# Patient Record
Sex: Male | Born: 1940
Health system: Southern US, Community
[De-identification: ages and names within clinical notes are randomized; demographics above are authoritative.]

## PROBLEM LIST (undated history)

## (undated) DIAGNOSIS — E785 Hyperlipidemia, unspecified: Secondary | ICD-10-CM

## (undated) DIAGNOSIS — N400 Enlarged prostate without lower urinary tract symptoms: Secondary | ICD-10-CM

## (undated) DIAGNOSIS — I82409 Acute embolism and thrombosis of unspecified deep veins of unspecified lower extremity: Secondary | ICD-10-CM

## (undated) DIAGNOSIS — M1990 Unspecified osteoarthritis, unspecified site: Secondary | ICD-10-CM

## (undated) HISTORY — PX: EYE SURGERY: SHX253

## (undated) HISTORY — PX: CATARACT EXTRACTION: SUR2

## (undated) HISTORY — PX: SEPTOPLASTY: SUR1290

## (undated) HISTORY — PX: TONSILLECTOMY: SUR1361

## (undated) HISTORY — PX: COLONOSCOPY W/ POLYPECTOMY: SHX1380

## (undated) HISTORY — PX: KNEE ARTHROSCOPY: SHX127

## (undated) HISTORY — DX: Hyperlipidemia, unspecified: E78.5

## (undated) HISTORY — PX: APPENDECTOMY: SHX54

## (undated) HISTORY — PX: HERNIA REPAIR: SHX51

## (undated) HISTORY — DX: Unspecified osteoarthritis, unspecified site: M19.90

---

## 1997-08-30 ENCOUNTER — Encounter: Admission: RE | Admit: 1997-08-30 | Discharge: 1997-08-30 | Payer: Self-pay | Admitting: Family Medicine

## 1998-04-16 ENCOUNTER — Encounter: Admission: RE | Admit: 1998-04-16 | Discharge: 1998-04-16 | Payer: Self-pay | Admitting: Sports Medicine

## 1998-06-18 ENCOUNTER — Encounter: Admission: RE | Admit: 1998-06-18 | Discharge: 1998-06-18 | Payer: Self-pay | Admitting: Family Medicine

## 1999-08-28 ENCOUNTER — Encounter: Admission: RE | Admit: 1999-08-28 | Discharge: 1999-08-28 | Payer: Self-pay | Admitting: Family Medicine

## 1999-09-05 ENCOUNTER — Encounter: Admission: RE | Admit: 1999-09-05 | Discharge: 1999-09-05 | Payer: Self-pay | Admitting: Family Medicine

## 1999-10-06 ENCOUNTER — Ambulatory Visit (HOSPITAL_COMMUNITY): Admission: RE | Admit: 1999-10-06 | Discharge: 1999-10-06 | Payer: Self-pay | Admitting: Sports Medicine

## 2000-07-23 ENCOUNTER — Encounter: Admission: RE | Admit: 2000-07-23 | Discharge: 2000-07-23 | Payer: Self-pay | Admitting: Family Medicine

## 2002-03-15 ENCOUNTER — Encounter: Payer: Self-pay | Admitting: Ophthalmology

## 2002-03-17 ENCOUNTER — Ambulatory Visit (HOSPITAL_COMMUNITY): Admission: RE | Admit: 2002-03-17 | Discharge: 2002-03-17 | Payer: Self-pay | Admitting: Ophthalmology

## 2002-07-06 ENCOUNTER — Encounter: Admission: RE | Admit: 2002-07-06 | Discharge: 2002-07-06 | Payer: Self-pay | Admitting: Family Medicine

## 2002-07-07 ENCOUNTER — Encounter: Admission: RE | Admit: 2002-07-07 | Discharge: 2002-07-07 | Payer: Self-pay | Admitting: Family Medicine

## 2003-11-15 ENCOUNTER — Ambulatory Visit: Payer: Self-pay | Admitting: Sports Medicine

## 2003-12-18 ENCOUNTER — Ambulatory Visit (HOSPITAL_COMMUNITY): Admission: RE | Admit: 2003-12-18 | Discharge: 2003-12-18 | Payer: Self-pay | Admitting: Vascular Surgery

## 2003-12-18 ENCOUNTER — Ambulatory Visit: Payer: Self-pay | Admitting: Sports Medicine

## 2004-01-11 ENCOUNTER — Ambulatory Visit (HOSPITAL_COMMUNITY): Admission: RE | Admit: 2004-01-11 | Discharge: 2004-01-11 | Payer: Self-pay | Admitting: Gastroenterology

## 2004-01-11 ENCOUNTER — Encounter (INDEPENDENT_AMBULATORY_CARE_PROVIDER_SITE_OTHER): Payer: Self-pay | Admitting: *Deleted

## 2004-07-31 ENCOUNTER — Ambulatory Visit: Payer: Self-pay | Admitting: Sports Medicine

## 2004-09-08 ENCOUNTER — Ambulatory Visit: Payer: Self-pay | Admitting: Sports Medicine

## 2004-09-09 ENCOUNTER — Ambulatory Visit: Payer: Self-pay | Admitting: Sports Medicine

## 2004-09-09 ENCOUNTER — Ambulatory Visit (HOSPITAL_COMMUNITY): Admission: RE | Admit: 2004-09-09 | Discharge: 2004-09-09 | Payer: Self-pay | Admitting: Sports Medicine

## 2004-09-12 ENCOUNTER — Ambulatory Visit: Payer: Self-pay | Admitting: Family Medicine

## 2004-09-15 ENCOUNTER — Ambulatory Visit: Payer: Self-pay | Admitting: Family Medicine

## 2004-09-19 ENCOUNTER — Ambulatory Visit: Payer: Self-pay | Admitting: Family Medicine

## 2004-09-24 ENCOUNTER — Ambulatory Visit: Payer: Self-pay | Admitting: Family Medicine

## 2004-09-26 ENCOUNTER — Ambulatory Visit (HOSPITAL_COMMUNITY): Admission: RE | Admit: 2004-09-26 | Discharge: 2004-09-26 | Payer: Self-pay | Admitting: Family Medicine

## 2004-09-29 ENCOUNTER — Ambulatory Visit: Payer: Self-pay | Admitting: Family Medicine

## 2004-10-02 ENCOUNTER — Ambulatory Visit: Payer: Self-pay | Admitting: Sports Medicine

## 2004-10-10 ENCOUNTER — Ambulatory Visit: Payer: Self-pay | Admitting: Sports Medicine

## 2004-10-21 ENCOUNTER — Ambulatory Visit: Payer: Self-pay | Admitting: Sports Medicine

## 2004-11-07 ENCOUNTER — Ambulatory Visit: Payer: Self-pay | Admitting: Sports Medicine

## 2004-12-05 ENCOUNTER — Ambulatory Visit: Payer: Self-pay | Admitting: Sports Medicine

## 2006-04-08 DIAGNOSIS — G2589 Other specified extrapyramidal and movement disorders: Secondary | ICD-10-CM | POA: Insufficient documentation

## 2006-04-08 DIAGNOSIS — E7800 Pure hypercholesterolemia, unspecified: Secondary | ICD-10-CM

## 2006-04-08 DIAGNOSIS — M545 Low back pain: Secondary | ICD-10-CM

## 2006-04-08 DIAGNOSIS — I872 Venous insufficiency (chronic) (peripheral): Secondary | ICD-10-CM | POA: Insufficient documentation

## 2006-04-08 DIAGNOSIS — Z86718 Personal history of other venous thrombosis and embolism: Secondary | ICD-10-CM

## 2006-06-10 ENCOUNTER — Ambulatory Visit: Payer: Self-pay | Admitting: Sports Medicine

## 2006-06-10 DIAGNOSIS — M1990 Unspecified osteoarthritis, unspecified site: Secondary | ICD-10-CM | POA: Insufficient documentation

## 2006-06-10 LAB — CONVERTED CEMR LAB
HCT: 51.3 %
Hemoglobin: 18.2 g/dL
MCV: 95.4 fL
Platelets: 212 10*3/uL
RBC: 5.37 M/uL

## 2006-06-14 LAB — CONVERTED CEMR LAB
ALT: 21 units/L (ref 0–53)
AST: 22 units/L (ref 0–37)
Alkaline Phosphatase: 58 units/L (ref 39–117)
Cholesterol: 235 mg/dL — ABNORMAL HIGH (ref 0–200)
Creatinine, Ser: 1.07 mg/dL (ref 0.40–1.50)
LDL Cholesterol: 154 mg/dL — ABNORMAL HIGH (ref 0–99)
Sodium: 141 meq/L (ref 135–145)
Total Bilirubin: 1.1 mg/dL (ref 0.3–1.2)
Total CHOL/HDL Ratio: 4.3
Total Protein: 7 g/dL (ref 6.0–8.3)
VLDL: 26 mg/dL (ref 0–40)

## 2006-10-26 ENCOUNTER — Encounter: Payer: Self-pay | Admitting: Sports Medicine

## 2006-11-01 ENCOUNTER — Encounter: Payer: Self-pay | Admitting: Sports Medicine

## 2007-04-04 ENCOUNTER — Encounter: Payer: Self-pay | Admitting: Sports Medicine

## 2007-04-27 ENCOUNTER — Encounter: Payer: Self-pay | Admitting: Sports Medicine

## 2007-05-03 ENCOUNTER — Encounter: Payer: Self-pay | Admitting: *Deleted

## 2007-05-17 ENCOUNTER — Encounter: Payer: Self-pay | Admitting: *Deleted

## 2007-07-01 ENCOUNTER — Ambulatory Visit (HOSPITAL_COMMUNITY): Admission: RE | Admit: 2007-07-01 | Discharge: 2007-07-01 | Payer: Self-pay | Admitting: Sports Medicine

## 2007-07-01 ENCOUNTER — Ambulatory Visit: Payer: Self-pay | Admitting: Sports Medicine

## 2007-08-24 ENCOUNTER — Encounter: Payer: Self-pay | Admitting: *Deleted

## 2008-03-30 ENCOUNTER — Encounter: Payer: Self-pay | Admitting: Family Medicine

## 2008-03-30 LAB — CONVERTED CEMR LAB: PSA: 4.22 ng/mL

## 2008-04-06 ENCOUNTER — Ambulatory Visit: Payer: Self-pay | Admitting: Family Medicine

## 2008-04-06 DIAGNOSIS — D126 Benign neoplasm of colon, unspecified: Secondary | ICD-10-CM

## 2008-04-10 ENCOUNTER — Ambulatory Visit: Payer: Self-pay | Admitting: Family Medicine

## 2008-04-10 ENCOUNTER — Encounter: Payer: Self-pay | Admitting: Family Medicine

## 2008-04-10 LAB — CONVERTED CEMR LAB
AST: 22 units/L (ref 0–37)
Alkaline Phosphatase: 63 units/L (ref 39–117)
Glucose, Bld: 95 mg/dL (ref 70–99)
HDL: 55 mg/dL (ref >39)
LDL Cholesterol: 148 mg/dL — ABNORMAL HIGH (ref 0–99)
Sodium: 143 meq/L (ref 135–145)
Total Bilirubin: 0.8 mg/dL (ref 0.3–1.2)
Total Protein: 6.7 g/dL (ref 6.0–8.3)
Triglycerides: 94 mg/dL (ref <150)
VLDL: 19 mg/dL (ref 0–40)
Vit D, 25-Hydroxy: 23 ng/mL — ABNORMAL LOW (ref 30–89)

## 2008-04-24 ENCOUNTER — Encounter: Payer: Self-pay | Admitting: Family Medicine

## 2008-04-25 ENCOUNTER — Encounter: Payer: Self-pay | Admitting: Family Medicine

## 2008-07-30 ENCOUNTER — Ambulatory Visit: Payer: Self-pay | Admitting: Family Medicine

## 2008-07-30 LAB — CONVERTED CEMR LAB
AST: 27 units/L (ref 0–37)
Alkaline Phosphatase: 55 units/L (ref 39–117)
BUN: 21 mg/dL (ref 6–23)
Creatinine, Ser: 1.26 mg/dL (ref 0.40–1.50)
Potassium: 4.2 meq/L (ref 3.5–5.3)

## 2008-07-31 ENCOUNTER — Telehealth: Payer: Self-pay | Admitting: *Deleted

## 2009-02-08 ENCOUNTER — Emergency Department (HOSPITAL_COMMUNITY): Admission: EM | Admit: 2009-02-08 | Discharge: 2009-02-08 | Payer: Self-pay | Admitting: Family Medicine

## 2009-02-25 ENCOUNTER — Encounter: Payer: Self-pay | Admitting: Family Medicine

## 2009-05-15 ENCOUNTER — Encounter: Payer: Self-pay | Admitting: Family Medicine

## 2009-07-05 ENCOUNTER — Ambulatory Visit: Payer: Self-pay | Admitting: Family Medicine

## 2009-07-05 ENCOUNTER — Ambulatory Visit (HOSPITAL_COMMUNITY): Admission: RE | Admit: 2009-07-05 | Discharge: 2009-07-05 | Payer: Self-pay | Admitting: Family Medicine

## 2009-07-05 DIAGNOSIS — IMO0001 Reserved for inherently not codable concepts without codable children: Secondary | ICD-10-CM

## 2009-07-05 DIAGNOSIS — G5600 Carpal tunnel syndrome, unspecified upper limb: Secondary | ICD-10-CM

## 2009-07-05 DIAGNOSIS — R5383 Other fatigue: Secondary | ICD-10-CM

## 2009-07-05 DIAGNOSIS — R5381 Other malaise: Secondary | ICD-10-CM | POA: Insufficient documentation

## 2009-07-05 DIAGNOSIS — R001 Bradycardia, unspecified: Secondary | ICD-10-CM

## 2009-07-09 LAB — CONVERTED CEMR LAB
BUN: 20 mg/dL (ref 6–23)
CO2: 25 meq/L (ref 19–32)
Cholesterol: 153 mg/dL (ref 0–200)
Creatinine, Ser: 1.04 mg/dL (ref 0.40–1.50)
Glucose, Bld: 86 mg/dL (ref 70–99)
Hemoglobin: 16.4 g/dL (ref 13.0–17.0)
MCHC: 33.7 g/dL (ref 30.0–36.0)
MCV: 94.9 fL (ref 78.0–100.0)
RBC: 5.12 M/uL (ref 4.22–5.81)
Total Bilirubin: 1.4 mg/dL — ABNORMAL HIGH (ref 0.3–1.2)
Total CHOL/HDL Ratio: 2.6
Triglycerides: 81 mg/dL (ref <150)
VLDL: 16 mg/dL (ref 0–40)

## 2009-07-10 ENCOUNTER — Encounter: Payer: Self-pay | Admitting: Family Medicine

## 2009-07-15 ENCOUNTER — Encounter: Payer: Self-pay | Admitting: Family Medicine

## 2009-07-31 ENCOUNTER — Ambulatory Visit: Payer: Self-pay | Admitting: Family Medicine

## 2009-08-01 ENCOUNTER — Ambulatory Visit: Payer: Self-pay | Admitting: Family Medicine

## 2009-08-16 ENCOUNTER — Ambulatory Visit: Payer: Self-pay | Admitting: Family Medicine

## 2009-08-16 ENCOUNTER — Encounter: Payer: Self-pay | Admitting: Family Medicine

## 2009-09-27 ENCOUNTER — Encounter: Payer: Self-pay | Admitting: Family Medicine

## 2009-09-27 ENCOUNTER — Ambulatory Visit: Payer: Self-pay | Admitting: Sports Medicine

## 2009-09-27 LAB — CONVERTED CEMR LAB: Total CK: 139 units/L (ref 7–232)

## 2009-09-30 ENCOUNTER — Telehealth: Payer: Self-pay | Admitting: Family Medicine

## 2009-12-12 ENCOUNTER — Ambulatory Visit: Payer: Self-pay | Admitting: Family Medicine

## 2009-12-12 DIAGNOSIS — J069 Acute upper respiratory infection, unspecified: Secondary | ICD-10-CM | POA: Insufficient documentation

## 2009-12-16 ENCOUNTER — Telehealth: Payer: Self-pay | Admitting: Family Medicine

## 2009-12-17 ENCOUNTER — Telehealth: Payer: Self-pay | Admitting: Family Medicine

## 2009-12-19 ENCOUNTER — Ambulatory Visit: Payer: Self-pay | Admitting: Family Medicine

## 2009-12-19 DIAGNOSIS — J029 Acute pharyngitis, unspecified: Secondary | ICD-10-CM | POA: Insufficient documentation

## 2010-03-11 NOTE — Progress Notes (Signed)
Summary: phn msg  Phone Note Call from Patient Call back at [phone]   Caller: Patient Summary of Call: pt is returning call Initial call taken by: De Nurse,  September 30, 2009 11:31 AM  Follow-up for Phone Call        Called.  CK normal off simvastatin.  Will start mid dose pravastatin and check labs in 3 months.  Patient agrees with plan. Follow-up by: Doralee Albino MD,  September 30, 2009 11:53 AM    New/Updated Medications: PRAVASTATIN SODIUM 20 MG TABS (PRAVASTATIN SODIUM) one by mouth qhs Prescriptions: PRAVASTATIN SODIUM 20 MG TABS (PRAVASTATIN SODIUM) one by mouth qhs  #90 x 3   Entered and Authorized by:   Doralee Albino MD   Signed by:   Doralee Albino MD on 09/30/2009   Method used:   Electronically to        [facility]* (retail)       [address]       [place], [place]  [id number]       Ph: [phone]       Fax: [phone]   RxID:   [id number]

## 2010-03-11 NOTE — Assessment & Plan Note (Signed)
Summary: place holter monitor/eo  Nurse Visit In office for placement of Holter Monitor.  Theresia Lo RN  July 31, 2009 9:18 AM   Allergies: 1)  ! Coumadin  Orders Added: 1)  Holter Monitor- [facility] [[id number]] 2)  Est Level 1- [facility] [[id number]]

## 2010-03-11 NOTE — Assessment & Plan Note (Signed)
Summary: cpe,tcb   Vital Signs:  Patient profile:   70 year old male Height:      75 inches Weight:      203 pounds BMI:     25.46 Temp:     97.6 degrees F Pulse rate:   46 / minute BP sitting:   136 / 74  (left arm) Cuff size:   regular  Vitals Entered By: Dennison Nancy RN CC: Adult physical Is Patient Diabetic? No Pain Assessment Patient in pain? yes     Location: rightelbow Intensity: 5 Type: aching   CC:  Adult physical.  History of Present Illness: Left hand numbness. muscle and joint pains recently. bradycardic.  Not on any meds that would slow heart rate.   Sees uro Patsi Sears) to follow BPH and PSA  Habits & Providers  Alcohol-Tobacco-Diet     Alcohol drinks/day: 3     Tobacco Status: quit > 6 months     Tobacco Counseling: to remain off tobacco products     Diet Comments: healthy  Exercise-Depression-Behavior     Does Patient Exercise: yes     Have you felt down or hopeless? no     Have you felt little pleasure in things? no     STD Risk: never     Drug Use: never     Seat Belt Use: always     Sun Exposure: infrequent  Current Medications (verified): 1)  Clonazepam 0.5 Mg Tabs (Clonazepam) .... Take 1 Tablet By Mouth Every Night 2)  Androgel Pump 1 % Gel (Testosterone) .... Per Uro 3)  Adult Aspirin Ec Low Strength 81 Mg Tbec (Aspirin) .... Otc Daily 4)  Simvastatin 20 Mg Tabs (Simvastatin) .Marland Kitchen.. 1 By Mouth At Bedtime  Allergies (verified): 1)  ! Coumadin  Past History:  Past medical, surgical, family and social histories (including risk factors) reviewed, and no changes noted (except as noted below).  Past Medical History: Reviewed history from 04/06/2008 and no changes required. atypical chest pain, lt shoulder - AC joint arthritis?  retinal detachment , right piriformis syndrome  tear of lt medical gastroc vs DVT - 07/2004  uses androgel  Past Surgical History: Reviewed history from 04/08/2006 and no changes required. Appendectomy -  08/28/1999, cataract surgery OD 2004 - 07/06/2002, ETT 12 mins 98/ 11 mins in 2001 - 08/28/1999, Hernia Repair - 08/28/1999  Family History: Reviewed history from 06/10/2006 and no changes required. 2 siblings - sister with breast ca  brother with melanoma  remarkable longevity father died at age 62  mother died with alzheimer`s at 51  Social History: Reviewed history from 06/10/2006 and no changes required. lives with wife Lupita Leash second wife/ she had MI may 2004;   divorced and has 4 kids by first wife; retired from [employer]  ex smoker 1962 to 25;  social etoh only;  exercises regularly - walking and tennisSmoking Status:  quit > 6 months Does Patient Exercise:  yes STD Risk:  never Drug Use:  never Seat Belt Use:  always Sun Exposure-Excessive:  infrequent  Physical Exam  General:  Well-developed,well-nourished,in no acute distress; alert,appropriate and cooperative throughout examination Head:  Normocephalic and atraumatic without obvious abnormalities. No apparent alopecia or balding. Mouth:  Oral mucosa and oropharynx without lesions or exudates.  Teeth in good repair. Neck:  No deformities, masses, or tenderness noted. Lungs:  Normal respiratory effort, chest expands symmetrically. Lungs are clear to auscultation, no crackles or wheezes. Heart:  Normal rate and regular rhythm. S1 and S2  normal without gallop, murmur, click, rub or other extra sounds. Abdomen:  Bowel sounds positive,abdomen soft and non-tender without masses, organomegaly or hernias noted. Msk:  No deformity or scoliosis noted of thoracic or lumbar spine.   Extremities:  + compression test Left wrist Neurologic:  No cranial nerve deficits noted. Station and gait are normal. Plantar reflexes are down-going bilaterally. DTRs are symmetrical throughout. Sensory, motor and coordinative functions appear intact.   Impression & Recommendations:  Problem # 1:  Preventive Health Care (ICD-V70.0)  Problem  # 2:  BRADYCARDIA (ICD-427.89) Consider holter if bloodwork shows no clear reason for fatigue His updated medication list for this problem includes:    Adult Aspirin Ec Low Strength 81 Mg Tbec (Aspirin) ..... Otc daily  Orders: 12 Lead EKG (12 Lead EKG)  Problem # 3:  MYALGIA (ICD-729.1) Check to see if statin induced myositis His updated medication list for this problem includes:    Adult Aspirin Ec Low Strength 81 Mg Tbec (Aspirin) ..... Otc daily  Orders: CK (Creatine Kinase)-[facility] [phone])  Problem # 4:  FATIGUE (ICD-780.79) Labs - if normal consider holter Orders: CBC-[facility] (09811) TSH-[facility] ([phone]) Sed Rate (ESR)-[facility] (13086)  Problem # 5:  CARPAL TUNNEL SYNDROME (ICD-354.0) mild.  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  Complete Medication List: 1)  Clonazepam 0.5 Mg Tabs (Clonazepam) .... Take 1 tablet by mouth every night 2)  Androgel Pump 1 % Gel (Testosterone) .... Per uro 3)  Adult Aspirin Ec Low Strength 81 Mg Tbec (Aspirin) .... Otc daily 4)  Simvastatin 10 Mg Tabs (Simvastatin) .Marland Kitchen.. 1 by mouth at bedtime 5)  Glucosamine-chondroitin Caps (Glucosamine-chondroit-vit c-mn) .... Otc 6)  Eql Fish Oil 1000 Mg Caps (Omega-3 fatty acids) .... One by mouth three times a day  Other Orders: Comp Met-[facility] [phone]) Lipid-[facility] [phone]) [facility] - Est  [phone])

## 2010-03-11 NOTE — Assessment & Plan Note (Signed)
Summary: still congestion/hensel/bmc   Vital Signs:  Patient profile:   70 year old male Height:      75 inches Weight:      202.31 pounds BMI:     25.38 BSA:     2.21 Temp:     98.8 degrees F Pulse rate:   47 / minute BP sitting:   126 / 73  Vitals Entered By: Jone Baseman CMA (December 19, 2009 10:35 AM) CC: still congested Is Patient Diabetic? No Pain Assessment Patient in pain? yes     Location: left side of neck tenderness Intensity: 5   Primary Provider:  Doralee Albino MD  CC:  still congested.  History of Present Illness: 1. URI viral in nature. no fever, no chest pain, no sob. c/o prolonged cough and yellow sputum. Now has nack tenderness to palpation in bilateral glands. treat conservatively. will check strep swab 2nd to contact with two strep + children. Will give a perscription for a zpack in case he is still not better in 4 days.  Habits & Providers  Alcohol-Tobacco-Diet     Alcohol drinks/day: 3     Tobacco Status: quit > 6 months     Tobacco Counseling: to remain off tobacco products     Diet Comments: healthy  Allergies: 1)  ! Coumadin 2)  Simvastatin (Simvastatin)  Review of Systems       see HPI  Physical Exam  General:  Well-developed,well-nourished,in no acute distress; alert,appropriate and cooperative throughout examination Nose:  External nasal examination shows no deformity or inflammation. Nasal mucosa are pink and moist without lesions or exudates. Mouth:  Oral mucosa and oropharynx without lesions or exudates.  Teeth in good repair. Neck:  left and right side of neck tender to palpation. swollen glands Lungs:  Normal respiratory effort, chest expands symmetrically. Lungs are clear to auscultation, no crackles or wheezes. Heart:  Normal rate and regular rhythm. S1 and S2 normal without gallop, murmur, click, rub or other extra sounds.   Impression & Recommendations:  Problem # 1:  URI (ICD-465.9) zpack if not better in 4  days.  His updated medication list for this problem includes:    Adult Aspirin Ec Low Strength 81 Mg Tbec (Aspirin) ..... Otc daily    Tussionex Pennkinetic Er 10-8 Mg/46ml Lqcr (Hydrocod polst-chlorphen polst) .Marland KitchenMarland KitchenMarland KitchenMarland Kitchen 5 ml every 12 hours for cough.  dispense 100 ml  Orders: [facility]- Est Level  3 (01093)  Complete Medication List: 1)  Clonazepam 0.5 Mg Tabs (Clonazepam) .... Take 1 tablet by mouth every night 2)  Androgel Pump 1 % Gel (Testosterone) .... Per uro 3)  Adult Aspirin Ec Low Strength 81 Mg Tbec (Aspirin) .... Otc daily 4)  Glucosamine-chondroitin Caps (Glucosamine-chondroit-vit c-mn) .... Otc 5)  Eql Fish Oil 1000 Mg Caps (Omega-3 fatty acids) .... One by mouth three times a day 6)  Pravastatin Sodium 20 Mg Tabs (Pravastatin sodium) .... One by mouth qhs 7)  Tussionex Pennkinetic Er 10-8 Mg/24ml Lqcr (Hydrocod polst-chlorphen polst) .... 5 ml every 12 hours for cough.  dispense 100 ml 8)  Azithromycin 250 Mg Tabs (Azithromycin) .... Take one tab a day by mouth for 5 days  Patient Instructions: 1)  Get plenty of rest, drink lots of clear liquids, and use Tylenol or Ibuprofen for fever and comfort. Return in 7-10 days if you're not better:sooner if you're feeling worse. 2)  I sent a perscription to [facility] pharmacy at [facility]. If you are still having symptoms in 4 days,  there is a antibiotic waiting for you.  3)  Nice to meet you, 4)  get better soon. Prescriptions: AZITHROMYCIN 250 MG TABS (AZITHROMYCIN) take one tab a day by mouth for 5 days  #5 x 0   Entered and Authorized by:   Edd Arbour   Signed by:   Edd Arbour on 12/19/2009   Method used:   Electronically to        [facility]* (retail)       [address]       [place], [place]  [id number]       Ph: [phone]       Fax: [phone]   RxID:   [id number]    Orders Added: 1)  [facility]- Est Level  3 [99213]  Appended Document: still congestion/hensel/bmc   Orders Added: 1)  Rapid  Strep-[facility] [87430] 2)  Influenza Vaccine MCR [00025]    Immunizations Administered:  Influenza Vaccine # 1:    Vaccine Type: Fluvax MCR    Site: left deltoid    Mfr: GlaxoSmithKline    Dose: 0.5 ml    Route: IM    Given by: Jimmy Footman, CMA    Exp. Date: 08/09/2010    Lot #: [id number]    VIS given: 09/03/09 version given December 19, 2009.  Flu Vaccine Consent Questions:    Do you have a history of severe allergic reactions to this vaccine? no    Any prior history of allergic reactions to egg and/or gelatin? no    Do you have a sensitivity to the preservative Thimersol? no    Do you have a past history of Guillan-Barre Syndrome? no    Do you currently have an acute febrile illness? no    Have you ever had a severe reaction to latex? no    Vaccine information given and explained to patient? yes  Laboratory Results  Date/Time Received: December 19, 2009 11:15 AM  Date/Time Reported: December 19, 2009 11:30 AM   Other Tests  Rapid Strep: negative Comments: ............test performed by............Marland KitchenBAJordan, MT(ASCP)11:30 AM entered by Terese Door, CMA

## 2010-03-11 NOTE — Letter (Signed)
Summary: Wellness Visit Letter  [facility]  [address]   [place], [place] [address]   Phone: [phone]  Fax: [phone]    07/10/2009  Ryan [name] [address] [place], [place]  [address]  Dear Mr. Toral,  We are happy to let you know that since you are covered under Medicare you are able to have a FREE visit at the [facility] to discuss your HEALTH. This is a new benefit for Medicare.  There will be no co-payment.  At this visit you will meet with Luretha Murphy an expert in wellness and the nurse practitioner at our clinic.  At this visit we will discuss ways to keep you healthy and feeling well.  This visit will not replace your regular doctor visit and we cannot refill medications.  We may schedule future blood work, give shots if needed, or schedule tests to look for hidden problems.   You will need to plan to be here at least one hour to talk about your medical history, your current status, review all of your medications, and discuss your future plans for your health.  This information will be entered into your record for your doctor to have and review.  If you are interested in staying healthy, this type of visit can help.  Please call the office at: [phone], to schedule a "Medicare Wellness Visit".  The day of the visit you should bring in all of your medications, including any vitamins, herbs, over the counter products you take.  Make a list of all the other doctors that you see, so we know who they are. If you have any other health documents please bring them.  We look forward to helping you stay healthy.     Sincerely,   Luretha Murphy NP  Appended Document: Wellness Visit Letter mailed.

## 2010-03-11 NOTE — Miscellaneous (Signed)
Summary: med refill  Clinical Lists Changes Refilled clonazepam via fax from pharm Medications: Changed medication from CLONAZEPAM 0.5 MG TABS (CLONAZEPAM) Take 1 tablet by mouth every night to CLONAZEPAM 0.5 MG TABS (CLONAZEPAM) Take 1 tablet by mouth every night - Signed Rx of CLONAZEPAM 0.5 MG TABS (CLONAZEPAM) Take 1 tablet by mouth every night;  #90 x 3;  Signed;  Entered by: Doralee Albino MD;  Authorized by: Doralee Albino MD;  Method used: Handwritten    Prescriptions: CLONAZEPAM 0.5 MG TABS (CLONAZEPAM) Take 1 tablet by mouth every night  #90 x 3   Entered and Authorized by:   Doralee Albino MD   Signed by:   Doralee Albino MD on 02/25/2009   Method used:   Handwritten   RxID:   [id number]

## 2010-03-11 NOTE — Progress Notes (Signed)
Summary: meds  Phone Note Call from Patient Call back at Home Phone [phone]   Caller: Patient Summary of Call: would like cough syrup w/ codiene [facility] Initial call taken by: De Nurse,  December 17, 2009 8:38 AM  Follow-up for Phone Call        i just called it in.  Please let patient know Follow-up by: Delbert Harness MD,  December 17, 2009 10:23 AM

## 2010-03-11 NOTE — Consult Note (Signed)
[facility]: [facility]   Imported By: De Nurse 05/20/2009 15:31:41  _____________________________________________________________________  External Attachment:    Type:   Image     Comment:   External Document

## 2010-03-11 NOTE — Progress Notes (Signed)
Summary: Rx  Phone Note Call from Patient Call back at [phone]   Reason for Call: Talk to Nurse Summary of Call: pt came in asking for an rx for cough syrup w/codeine, pt was told if his cough wasn't any better MD would prescribe. pt goes to [facility] Initial call taken by: Knox Royalty,  December 16, 2009 3:43 PM  Follow-up for Phone Call        Patient calling again regarding codeine cough syrup - advised that I could not fill prescriptions via the emergency line - advised to call back tomorrow for medication.  Follow-up by: Bobby Rumpf  MD,  December 16, 2009 7:58 PM    New/Updated Medications: Sandria Senter ER 10-8 MG/5ML LQCR (HYDROCOD POLST-CHLORPHEN POLST) 5 ml every 12 hours for cough.  Dispense 100 ml Prescriptions: TUSSIONEX PENNKINETIC ER 10-8 MG/5ML LQCR (HYDROCOD POLST-CHLORPHEN POLST) 5 ml every 12 hours for cough.  Dispense 100 ml  #1 x 0   Entered and Authorized by:   Delbert Harness MD   Signed by:   Delbert Harness MD on 12/17/2009   Method used:   Historical   RxID:   [id number]  Called in to [facility].

## 2010-03-11 NOTE — Assessment & Plan Note (Signed)
Summary: cough/kf   Vital Signs:  Patient profile:   70 year old male Weight:      204.5 pounds O2 Sat:      96 % on Room air Temp:     97.8 degrees F oral Pulse rate:   54 / minute Pulse rhythm:   regular BP sitting:   149 / 77  (left arm) Cuff size:   regular  Vitals Entered By: Loralee Pacas CMA (December 12, 2009 4:30 PM)  O2 Flow:  Room air CC: bronchitis   Primary Care Provider:  Doralee Albino MD  CC:  bronchitis.  History of Present Illness: 70 yo with no history of lung disease  here with 4 days of "chest congestion"  coughing with sputum and fatigue.  No dyspnea, wheezing, fever, headache, sore throat, abd pain, chest pain, n/v/d, mylagias.  Gets this once a year in the fall.  cough is most bothersome symptom, treating fairly with with OTC meds.  Current Medications (verified): 1)  Clonazepam 0.5 Mg Tabs (Clonazepam) .... Take 1 Tablet By Mouth Every Night 2)  Androgel Pump 1 % Gel (Testosterone) .... Per Uro 3)  Adult Aspirin Ec Low Strength 81 Mg Tbec (Aspirin) .... Otc Daily 4)  Glucosamine-Chondroitin  Caps (Glucosamine-Chondroit-Vit C-Mn) .... Otc 5)  Eql Fish Oil 1000 Mg Caps (Omega-3 Fatty Acids) .... One By Mouth Three Times A Day 6)  Pravastatin Sodium 20 Mg Tabs (Pravastatin Sodium) .... One By Mouth Qhs  Allergies: 1)  ! Coumadin 2)  Simvastatin (Simvastatin) PMH-FH-SH reviewed for relevance  Review of Systems      See HPI  Physical Exam  General:  Well-developed,well-nourished,in no acute distress; alert,appropriate and cooperative throughout examination Lungs:  Normal respiratory effort, chest expands symmetrically. Lungs are clear to auscultation, no crackles or wheezes. Heart:  Normal rate and regular rhythm. S1 and S2 normal without gallop, murmur, click, rub or other extra sounds. Abdomen:  Bowel sounds positive,abdomen soft and non-tender without masses, organomegaly or hernias noted.   Impression & Recommendations:  Problem # 1:  URI  (ICD-465.9)  Normal exam, no signs of bacterial infection.  Given short duration thus far and mild symptoms with no chronic disease, symptomatic treatment.  Discusse dhumidifier and honey for cough.  Offered cough syrup but patient prefers to continue with OTC meds.  Given red flags for return  His updated medication list for this problem includes:    Adult Aspirin Ec Low Strength 81 Mg Tbec (Aspirin) ..... Otc daily  Orders: [facility]- Est Level  3 ([address])  Complete Medication List: 1)  Clonazepam 0.5 Mg Tabs (Clonazepam) .... Take 1 tablet by mouth every night 2)  Androgel Pump 1 % Gel (Testosterone) .... Per uro 3)  Adult Aspirin Ec Low Strength 81 Mg Tbec (Aspirin) .... Otc daily 4)  Glucosamine-chondroitin Caps (Glucosamine-chondroit-vit c-mn) .... Otc 5)  Eql Fish Oil 1000 Mg Caps (Omega-3 fatty acids) .... One by mouth three times a day 6)  Pravastatin Sodium 20 Mg Tabs (Pravastatin sodium) .... One by mouth qhs   Orders Added: 1)  [facility]- Est Level  3 [[address]]

## 2010-03-11 NOTE — Assessment & Plan Note (Signed)
Summary: remove holter monitor/eo  Nurse Visit Holter monitor removed. Theresia Lo RN  August 01, 2009 12:22 PM   Allergies: 1)  ! Coumadin  Orders Added: 1)  No Charge Patient Arrived (NCPA0) [NCPA0]

## 2010-04-22 ENCOUNTER — Encounter: Payer: Self-pay | Admitting: Home Health Services

## 2010-05-12 LAB — POCT URINALYSIS DIP (DEVICE)
Bilirubin Urine: NEGATIVE
Glucose, UA: NEGATIVE mg/dL
Ketones, ur: 15 mg/dL — AB
Specific Gravity, Urine: 1.02 (ref 1.005–1.030)
Urobilinogen, UA: 1 mg/dL (ref 0.0–1.0)

## 2010-05-19 ENCOUNTER — Other Ambulatory Visit: Payer: Self-pay | Admitting: Family Medicine

## 2010-05-19 ENCOUNTER — Encounter: Payer: Self-pay | Admitting: Family Medicine

## 2010-05-19 DIAGNOSIS — H9190 Unspecified hearing loss, unspecified ear: Secondary | ICD-10-CM | POA: Insufficient documentation

## 2010-05-19 NOTE — Progress Notes (Signed)
Via e mail, requested hearing eval due to what seems to be age related difficulties and the possible need for a hearing aid.

## 2010-06-05 ENCOUNTER — Encounter: Payer: Self-pay | Admitting: Family Medicine

## 2010-06-05 DIAGNOSIS — N400 Enlarged prostate without lower urinary tract symptoms: Secondary | ICD-10-CM | POA: Insufficient documentation

## 2010-06-05 DIAGNOSIS — E291 Testicular hypofunction: Secondary | ICD-10-CM | POA: Insufficient documentation

## 2010-06-05 NOTE — Progress Notes (Signed)
  Subjective:    Patient ID: Ryan [name], male    DOB: 1940-10-03, 70 y.o.   MRN: [id number]  HPI  Seen by Marella Chimes on 06/03/10.  PSA=3.88 followed by uro    Review of Systems     Objective:   Physical Exam        Assessment & Plan:

## 2010-06-25 ENCOUNTER — Ambulatory Visit (INDEPENDENT_AMBULATORY_CARE_PROVIDER_SITE_OTHER): Payer: Medicare Other | Admitting: Family Medicine

## 2010-06-25 ENCOUNTER — Encounter: Payer: Self-pay | Admitting: Family Medicine

## 2010-06-25 VITALS — BP 131/82 | HR 61 | Temp 97.0°F | Ht 76.0 in | Wt 200.5 lb

## 2010-06-25 DIAGNOSIS — IMO0001 Reserved for inherently not codable concepts without codable children: Secondary | ICD-10-CM

## 2010-06-25 DIAGNOSIS — E785 Hyperlipidemia, unspecified: Secondary | ICD-10-CM

## 2010-06-25 DIAGNOSIS — M791 Myalgia, unspecified site: Secondary | ICD-10-CM

## 2010-06-25 MED ORDER — ATORVASTATIN CALCIUM 10 MG PO TABS: 10.0000 mg | ORAL_TABLET | ORAL | Status: DC

## 2010-06-27 ENCOUNTER — Encounter: Payer: Self-pay | Admitting: Family Medicine

## 2010-06-27 DIAGNOSIS — M791 Myalgia, unspecified site: Secondary | ICD-10-CM | POA: Insufficient documentation

## 2010-06-27 NOTE — Progress Notes (Signed)
  Subjective:    Patient ID: Ryan [name], male    DOB: 01/28/1941, 70 y.o.   MRN: [id number]  HPI Main issue is muscle pain.  Had with simvastatin and had increase in CPK.  Switched to pravastatin and did well for a while, but now seems to have sig myalgias.  Second issue is check for cerumen impaction.    Review of Systems     Objective:   Physical Exam Canals clear and TMs nl. Cardiac RRR Lungs clear Musculoskeletal normal       Assessment & Plan:

## 2010-06-27 NOTE — H&P (Signed)
   NAME:  Ryan [name], Ryan [name]                         ACCOUNT NO.:  [id number]   MEDICAL RECORD NO.:  [id number]                   PATIENT TYPE:  OIB   LOCATION:  2899                                 FACILITY:  [facility]   PHYSICIAN:  Guadelupe Sabin, M.D.             DATE OF BIRTH:  1940/08/06   DATE OF ADMISSION:  03/17/2002  DATE OF DISCHARGE:                                HISTORY & PHYSICAL   HISTORY OF PRESENT ILLNESS:  This was a planned outpatient surgical  admission of this 70 year old white male admitted for cataract implant  surgery of the right eye.   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  The patient was admitted on December 09, 1992, at which time  a pars plana vitrectomy with scleral bucking procedure was performed on the  right eye.  The patient did well postoperatively with retinal reattachment.  Gradually over the ensuing years the patient has developed cataract  formation in both eyes, greater in the right than left eye.  He has elected  to proceed with cataract implant surgery of the right eye at this time.   PAST MEDICAL HISTORY:  The patient is in excellent general health.   MEDICATIONS:  He currently takes no medications.   REVIEW OF SYSTEMS:  No cardiorespiratory complaints.   PHYSICAL EXAMINATION:  VITAL SIGNS:  As recorded on admission, blood  pressure 115/69, respirations 18, heart rate 56, and temperature 96.8  degrees.  GENERAL APPEARANCE:  The patient is a healthy, well-nourished, well-  developed, white male in no acute distress.  HEENT:  Eyes:  Visual acuity with best correction 20/300 right eye and 20/20  left eye.  Applanation tonometry normal, 16 mm right eye and 14 mm left eye.  On slit lamp examination, the eyes are white and clear with nuclear cataract  formation, greater in the right than left eye.  Detailed fundus examination  reveals a clear vitreous and old scleral buckling indentation.  The  retina  is attached with normal optic, nerves, blood vessels, and macular in the  right eye.  CHEST:  Lungs clear to auscultation and percussion.  HEART:  Normal sinus rhythm.  No cardiomegaly.  No murmurs.  ABDOMEN:  Negative.  EXTREMITIES:  Negative.   ADMISSION DIAGNOSES:  1. Senile cataract, right eye.  2. Status post scleral buckling for retinal detachment, right eye.   SURGICAL PLAN:  Cataract implant surgery, right eye.                                               Guadelupe Sabin, M.D.    HNJ/MEDQ  D:  03/17/2002  T:  03/17/2002  Job:  [id number]

## 2010-06-27 NOTE — H&P (Signed)
NAME:  Ryan [name], Ryan [name]                         ACCOUNT NO.:  [id number]   MEDICAL RECORD NO.:  [id number]                   PATIENT TYPE:  OIB   LOCATION:  2899                                 FACILITY:  [facility]   PHYSICIAN:  Guadelupe Sabin, M.D.             DATE OF BIRTH:  May 10, 1940   DATE OF ADMISSION:  03/17/2002  DATE OF DISCHARGE:                                HISTORY & PHYSICAL   PREOPERATIVE DIAGNOSES:  1. Senile nuclear cataract, right eye.  2. 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   POSTOPERATIVE DIAGNOSES:  1. Senile nuclear cataract, right eye.  2. 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   OPERATIONS:  Planned extracapsular cataract extraction and primary insertion  of posterior chamber intraocular lens implant.   SURGEON:  Guadelupe Sabin, M.D.   ASSISTANT:  Nurse.   ANESTHESIA:  Local 4% Xylocaine and 0.75 Marcaine with Wydase added  retrobulbar injection.  Topical tetracaine and intraoperative Xylocaine.  Anesthesia standby required in this 70 year old patient given sodium  pentothol intravenously during the period of retrobulbar injection.   DESCRIPTION OF PROCEDURE:  After the patient was prepped and draped, a lid  speculum was inserted in the right eye.  The eye was turned toward and a  superior rectus traction suture placed.  Schiotz tonometry was recorded at  10 scale units with a 5.5 g weight.  A peritomy was performed adjacent to  the limbus from the 11 to 1 o'clock position.  The corneoscleral junction  was cleaned and a corneoscleral groove made with a 45 degree Superblade.  The anterior chamber was then entered with the 2.5 mm diamond keratome at  the 12 o'clock position and a 15 degree blade at the 2:30 position.  Using a  bent 26 gauge needle on a Healon syringe, a circular capsulorrhexis was  begun and then completed with the Grabow forceps.  Hydrodissection and  hydrodelineation were  performed using 1% Xylocaine.  The 30 degree  phakoemulsification tip was then inserted with slow controlled  emulsification of the lens nucleus.  Total ultrasonic time 1 minute 12  seconds.  Average power level 21%.  Following removal of the nucleus, the  residual cortex was aspirated with the irrigation-aspiration tip.  The  posterior capsule appeared intact with a brilliant red fundus reflex.  It  was therefore elected to insert an [employer] [id number] silicone  posterior chamber intraocular lens implant, diopter strength +14.00.  This  was inserted with the McDonald forceps into the anterior chamber and then  centered into the capsular bag.  The lens appeared to be well centered.  The  intraocular Healon was then aspirated and replaced with balance salt  solution and Miochol ophthalmic solution.  The operative incisions appeared  to be self-sealing and no sutures were required.  A light patch and  protective shield  were applied to the operated  right eye.  The duration of the procedure and  anesthesia administration was 45 minutes.  The patient tolerated the  procedure well in general and left the operating room for the recovery room  in good condition.                                               Guadelupe Sabin, M.D.    HNJ/MEDQ  D:  03/17/2002  T:  03/17/2002  Job:  [id number]

## 2010-06-27 NOTE — Assessment & Plan Note (Signed)
Stop pravastatin for 4 weeks.  See if myalgias resolve.  If not, return for further WU If myalgias do resolve, start lipitor qod.

## 2010-07-15 ENCOUNTER — Other Ambulatory Visit: Payer: Self-pay | Admitting: Family Medicine

## 2010-07-15 ENCOUNTER — Ambulatory Visit (HOSPITAL_COMMUNITY)
Admission: RE | Admit: 2010-07-15 | Discharge: 2010-07-15 | Disposition: A | Discharge status: Home / Self Care | Payer: Medicare Other | Source: Ambulatory Visit | Attending: Family Medicine | Admitting: Family Medicine

## 2010-07-15 ENCOUNTER — Telehealth: Payer: Self-pay | Admitting: *Deleted

## 2010-07-15 DIAGNOSIS — M79609 Pain in unspecified limb: Secondary | ICD-10-CM | POA: Insufficient documentation

## 2010-07-15 DIAGNOSIS — M7989 Other specified soft tissue disorders: Secondary | ICD-10-CM | POA: Insufficient documentation

## 2010-07-15 DIAGNOSIS — I80299 Phlebitis and thrombophlebitis of other deep vessels of unspecified lower extremity: Secondary | ICD-10-CM

## 2010-07-15 NOTE — Telephone Encounter (Signed)
Spoke with patient and informed him of appointment set up for DVT @ 1pm.Nicolle Heward, Roselyn Meier

## 2010-07-15 NOTE — Progress Notes (Signed)
Received this e mail and responded:  Ryan [name],  You need to have that leg tested today for blood clots (venous dopplers.)  I will have my nurse call and arrange with you.  Please note new e mail address: [email or web address] Santiago Bumpers. Leveda Anna, MD Director, [facility] Professor,  [facility], Dept of [place] [phone] Fax- [phone] Digital pager [phone]   -----Original Message----- From: Tommie Sams [[email or web address]]  Sent: Tuesday, July 15, 2010 8:15 AM To: Leland Raver, Bill Subject: Left Leg  Bill, Sorry to bother you with this but I feel the need to do something (as does Lupita Leash) and I'm not sure what.  Since seeing you a few weeks ago, I have stopped the staten meds and am waiting with new prescription in hand to restart on/about July 1 as we discussed.  In the meantime, pain in my left leg has gotten progressively worse, exacerbated some by exercise, but continuous.  It seems centered in the back of the knee, which has a noticable puffy "swelling".  The concern is heightened by the fact that this is the leg I had the clot in a couple of years ago.  Ibuprofen seems to alleviate the pain some, suggesting perhaps inflamation?  My leg is not sensitive to touch as was the case with earlier phlebitis.  Most serious pain occurs when I stand up from a sitting position, and it takes a moment and several very hesitant steps before I can walk with any sense of "normal"  So, I am inclined to see either you or Roanna Epley, who treated me earlier for the clot.  What is your suggestion.  Ryan [name]  -- [name] [address] [place], [place]  [address] [phone] (home) [phone] (cell) [name]

## 2010-07-18 ENCOUNTER — Encounter: Payer: Self-pay | Admitting: Family Medicine

## 2010-07-18 ENCOUNTER — Ambulatory Visit (INDEPENDENT_AMBULATORY_CARE_PROVIDER_SITE_OTHER): Payer: Medicare Other | Admitting: Family Medicine

## 2010-07-18 DIAGNOSIS — R29898 Other symptoms and signs involving the musculoskeletal system: Secondary | ICD-10-CM

## 2010-07-18 NOTE — Assessment & Plan Note (Addendum)
Patient with popliteal fullness on the Left side.  No discreet mass nor calor.  Painful with flexion beyond 90 degrees.  Vascular doppler study of 06/05 done, no DVT nor Bakers cyst seen.  Will refer for Sports Medicine evaluation, for consideration of whether needs MRI imaging versus US done at [facility].  Ibuprofen seems to help this pain intermittently.

## 2010-07-18 NOTE — Progress Notes (Signed)
  Subjective:    Patient ID: Ryan [name], male    DOB: Dec 03, 1940, 70 y.o.   MRN: [id number]  HPI Patient here for same-day appointment, complaint of L popliteal pain that is worst when he gets up in the morning, gets mildly better during the day with activity.  Has hampered his tennis game of late.  Had prior "traumatic" DVT in the past after getting hit by tennis racket, was treated with anticoagulation for first-time DVT and finished course.  Had doppler US done on 07/15/10 which did not find evidence of DVT or Bakers cyst.  Patient reports some relief with ibuprofen, worsening with attempts at flexion of knee.  No recent trauma or injury.  No redness or swelling in the knee.  Some radiation to L groin at times.  Has been present for several months.  Different from the generalized leg pain that he had with simvastatin, not the same as his restless legs syndrome.    States that he has not started the lipitor yet.    Review of Systems     Objective:   Physical Exam Pleasant, well appearing, no apparent distress. Able to ambulate independently.  LEs: No knee effusion or erythema bilaterally.  In LEFT popliteal space there is a fullness and tenderness along the medial aspect of the popliteal fossa, not a discreet mass.  Does not move with flexion/extension.  Notable discomfort with passive or active flexion of L knee.  No cords, no edema in LEs bilaterally.  No joint laxity.  Palpable dp pulses bilaterally. Sensation in toes grossly intact bilaterally.        Assessment & Plan:

## 2010-07-18 NOTE — Patient Instructions (Signed)
Please make an appointment with Sports Medicine to evaluate the fullness in the left popliteal fossa.

## 2010-07-28 ENCOUNTER — Other Ambulatory Visit: Payer: Medicare Other

## 2010-07-28 ENCOUNTER — Other Ambulatory Visit: Payer: Self-pay | Admitting: Family Medicine

## 2010-07-28 ENCOUNTER — Encounter: Payer: Self-pay | Admitting: Family Medicine

## 2010-07-28 ENCOUNTER — Ambulatory Visit (INDEPENDENT_AMBULATORY_CARE_PROVIDER_SITE_OTHER): Payer: Medicare Other | Admitting: Family Medicine

## 2010-07-28 VITALS — BP 140/84

## 2010-07-28 DIAGNOSIS — R29898 Other symptoms and signs involving the musculoskeletal system: Secondary | ICD-10-CM

## 2010-07-28 DIAGNOSIS — M25562 Pain in left knee: Secondary | ICD-10-CM

## 2010-07-28 DIAGNOSIS — E785 Hyperlipidemia, unspecified: Secondary | ICD-10-CM

## 2010-07-28 LAB — COMPLETE METABOLIC PANEL WITH GFR
ALT: 14 U/L (ref 0–53)
CO2: 29 mEq/L (ref 19–32)
Creat: 1.05 mg/dL (ref 0.50–1.35)
GFR, Est African American: >60 mL/min (ref >60)
GFR, Est Non African American: >60 mL/min (ref >60)
Total Bilirubin: 0.7 mg/dL (ref 0.3–1.2)

## 2010-07-28 LAB — LIPID PANEL
Cholesterol: 208 mg/dL — ABNORMAL HIGH (ref 0–200)
HDL: 63 mg/dL (ref >39)
LDL Cholesterol: 135 mg/dL — ABNORMAL HIGH (ref 0–99)
Triglycerides: 52 mg/dL (ref <150)

## 2010-07-28 NOTE — Progress Notes (Signed)
cmp and flp done today Minola Guin 

## 2010-07-28 NOTE — Patient Instructions (Signed)
Your appt for the MRI is on Tues, June 26th at 10:15am at the [address] location of [facility]. Their number is [phone] if you have questions or need to reschedule the appt.

## 2010-07-28 NOTE — Progress Notes (Signed)
  Subjective:    Patient ID: Ryan [name], male    DOB: 10/18/1940, 70 y.o.   MRN: [id number]  HPI  Left knee pain. This is going on for several weeks. Came on slowly. No specific injury. Has been seen by his primary care physician and had vascular ultrasound to rule out DVT which was negative. Is having difficulty doing a lot of walking because of pain. Cannot fully extend his leg or fully bend it. This is gotten worse over the last 3 weeks. Pain is mostly in the posterior left knee, worse with stairs or extended periods of sitting.  PERTINENT  PMH / PSH:He has history of DVT many years ago. That was a traumatic DVT.  Review of Systems No fever, no unusual weight gain or loss. He has had some myalgias and arthralgias that his PCP thinks related to statin medication and they are switching those around. This symptom is getting better.    Objective:   Physical Exam    GENERAL: Well-developed nourished no acute distress Left knee: Lacks full extension by 15 and can only flex to 100. The left popliteal space is full. The calf is soft. His gait is somewhat antalgic. Left knee ligamentously intact and negative McMurray. SKIN: Small hyperpigmented area on the posterior lateral left knee. There is no warmth or edema of the popliteal space or the calf.  ;MSK [place]: Left knee and calf reveals normal gastrocnemius muscles bilaterally. At the point of origin of the gastroc heads, there is some nonspecific heterogeneous shadow. This does not appear a hypo-echoic. The popliteal artery and vein are identified. 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 I can't see this very well on the ultrasound. I think we need to get further imaging with MRI with contrast. We have set that up. He is going to get blood work today for creatinine level. I will call him after the MRI. In the interim I would not do anything  vigorous such as tennis or a lot of climbing. I think it's okay to do walking and stationary biking.

## 2010-07-29 ENCOUNTER — Encounter: Payer: Self-pay | Admitting: Family Medicine

## 2010-07-29 NOTE — Progress Notes (Signed)
  Subjective:    Patient ID: Ryan [name], male    DOB: Jul 24, 1940, 70 y.o.   MRN: [id number]  HPI High LDL is off meds.  He will start lipitor.    Review of Systems     Objective:   Physical Exam        Assessment & Plan:

## 2010-08-04 ENCOUNTER — Telehealth: Payer: Self-pay | Admitting: Family Medicine

## 2010-08-04 NOTE — Telephone Encounter (Signed)
Ordered at sports medicine

## 2010-08-04 NOTE — Telephone Encounter (Signed)
2nd call for precert.  Appt is for 9:00 Tuesday, June 26th, needs to hear something today.

## 2010-08-04 NOTE — Telephone Encounter (Signed)
Kristi at [facility] needs a precert for MRI for tomorrow.

## 2010-08-05 ENCOUNTER — Ambulatory Visit
Admission: RE | Admit: 2010-08-05 | Discharge: 2010-08-05 | Disposition: A | Discharge status: Home / Self Care | Payer: Medicare Other | Source: Ambulatory Visit | Attending: Family Medicine | Admitting: Family Medicine

## 2010-08-05 ENCOUNTER — Telehealth: Payer: Self-pay | Admitting: *Deleted

## 2010-08-05 ENCOUNTER — Other Ambulatory Visit: Payer: Medicare Other

## 2010-08-05 ENCOUNTER — Other Ambulatory Visit: Payer: Self-pay | Admitting: Family Medicine

## 2010-08-05 DIAGNOSIS — R2240 Localized swelling, mass and lump, unspecified lower limb: Secondary | ICD-10-CM

## 2010-08-05 DIAGNOSIS — M25562 Pain in left knee: Secondary | ICD-10-CM

## 2010-08-05 MED ORDER — GADOBENATE DIMEGLUMINE 529 MG/ML IV SOLN
18.0000 mL | Freq: Once | INTRAVENOUS | Status: AC | PRN
  Administered 2010-08-05: 18 mL via INTRAVENOUS

## 2010-08-05 NOTE — Telephone Encounter (Signed)
PA # for pt's MRI of Lt Knee w/ and wo contrast is A [id number].  Pt and [facility] notified of auth #.

## 2010-08-07 ENCOUNTER — Telehealth: Payer: Self-pay | Admitting: Family Medicine

## 2010-08-07 NOTE — Telephone Encounter (Signed)
Discussed his MRI--we decided he wants to go ahead and see surgeon--he has family orthopedist but cannot remember the name--he will call back w name and I wwill make referral Ryan [name]

## 2010-08-08 ENCOUNTER — Other Ambulatory Visit: Payer: Self-pay | Admitting: *Deleted

## 2010-08-08 DIAGNOSIS — M25562 Pain in left knee: Secondary | ICD-10-CM

## 2010-08-08 NOTE — Progress Notes (Signed)
Pt scheduled himself with Dr. Shelle Iron @ [facility] for 08/20/10.  Pt's records faxed to office today.

## 2010-08-18 ENCOUNTER — Other Ambulatory Visit: Payer: Self-pay | Admitting: Family Medicine

## 2010-08-18 MED ORDER — CLONAZEPAM 0.5 MG PO TABS: 0.5000 mg | ORAL_TABLET | Freq: Every day | ORAL | Status: DC

## 2010-08-18 MED ORDER — TRAMADOL HCL 50 MG PO TABS: 50.0000 mg | ORAL_TABLET | Freq: Four times a day (QID) | ORAL | Status: AC | PRN

## 2010-08-18 NOTE — Telephone Encounter (Signed)
In response to this e mail:  Annette Stable, I have phoned in a refill request to [facility], but they will need re-authorization to fill it.  Lupita Leash and I leave for a few days on Wednesday morning to attend our daughter's wedding and I don't have enough remaining to last me until we return.  Could you please call [facility] [phone]) or take such other action as is required?    Still chronic pain in left leg, centered in the knee, and varying in intensity day to day.  Ibuprophen helps.  Following MRI, was referred by Dr. Jennette Kettle?? at Sports Medicine clinic to specialist at [facility].  Dr. Thomasena Edis has seen several members of our family, but he is away so I will see another (name escapes me at the moment) on Tuesday.  I frankly would very much like Electa Sniff to see me - not sure of his status but may check on that Monday.  Thanks for your help with the prescription.  Ryan [name]  I refilled clonazepam, added tramadol for nighttime leg/knee pain, suggested he see Dr. Darrick Penna, and suggested he hold off on ortho appointment until he could see Dr. Thomasena Edis who he knows and trusts.  I have reviewed the MRI and don't see any need for urgent intervention.

## 2010-08-19 ENCOUNTER — Ambulatory Visit (INDEPENDENT_AMBULATORY_CARE_PROVIDER_SITE_OTHER): Payer: Medicare Other | Admitting: Family Medicine

## 2010-08-19 ENCOUNTER — Encounter: Payer: Self-pay | Admitting: Family Medicine

## 2010-08-19 VITALS — BP 132/83 | HR 70

## 2010-08-19 DIAGNOSIS — M25569 Pain in unspecified knee: Secondary | ICD-10-CM

## 2010-08-19 DIAGNOSIS — M25562 Pain in left knee: Secondary | ICD-10-CM | POA: Insufficient documentation

## 2010-08-19 MED ORDER — MELOXICAM 15 MG PO TABS: 15.0000 mg | ORAL_TABLET | Freq: Every day | ORAL | Status: AC

## 2010-08-19 NOTE — Progress Notes (Signed)
  Subjective:    Patient ID: Ryan [name], male    DOB: 09/25/1940, 70 y.o.   MRN: [id number]  HPI The patient is a previously healthy 70 year old male with left knee pain for approximately 2 months.  He was seen several times at the sports medicine center and had an ultrasound that was concerning for an vascular malformation. An MRI of his knee was performed which showed predominantly degenerative changes of the meniscus, a moderate effusion, a small tear of the medial head of the gastrocnemius, and some plica pathology. He returns today for followup.  His primary complaint is sharp and stabbing pain and decreased mobility because of his left knee. He also has significant pain with trying to straighten the knee. Otherwise there is no fevers chills night sweats or weight loss.  Review of Systems    see history of present illness Objective:   Physical Exam     General: Alert well-developed well-nourished Caucasian male who appears stated age. Musculoskeletal exam: The knee shows significant swelling on the left when compared with the right. There is significant tenderness to palpation over the lateral aspect of the knee. The patella is ballotable and the knee is somewhat warm. The patient's range of motion is from 10 of extension to 90 of flexion. He has significant pain and tightness when we attempt to exceed these limits. His MCL anterior cruciate ligament PCL and LCL are all intact.  Consent obtained and verified. Time-out conducted. Noted no overlying erythema, induration, or other signs of local infection. Sterile betadine prep. Furthur cleansed with alcohol. Topical analgesic spray: Ethyl chloride. 5 cc of 1% lidocaine was infiltrated under the skin over the lateral suprapatellar pocket. I was able to aspirate approximately 60 cc of straw-colored fluid from the left knee through an 18-gauge needle. The syringe was then switched and 1 cc of Kenalog 40 and 9 cc of lidocaine 1% was  injected into the knee. Completed without difficulty The patient had immediate improvement in pain. Advised to call if fevers/chills, erythema, induration, drainage, or persistent bleeding.  Assessment & Plan:

## 2010-08-19 NOTE — Assessment & Plan Note (Addendum)
The patient's left knee pain and fullness is most likely due to degenerative joint disease of the left knee. There is no sign of a vascular malformation based on the MRI. We aspirated approximately 60 cc of fluid from his knee and injected Kenalog 40 into the knee. As he also had significantly decreased range of motion we'll go ahead and get him in to see the physical therapist. I will also give him Mobic 15 by mouth daily for pain. Should he continue to have pain and swelling he would be a good candidate for Visco supplementation. I would like to see him back in approximately 1 month.  Ihor Austin. Benjamin Stain, M.D.  Examined history taken along with Sports Medicine Fellow, and agree with assessment and plan.  Hannah Beat, MD

## 2010-08-27 ENCOUNTER — Ambulatory Visit: Payer: Medicare Other | Attending: Family Medicine | Admitting: Physical Therapy

## 2010-08-27 DIAGNOSIS — IMO0001 Reserved for inherently not codable concepts without codable children: Secondary | ICD-10-CM | POA: Insufficient documentation

## 2010-08-27 DIAGNOSIS — R269 Unspecified abnormalities of gait and mobility: Secondary | ICD-10-CM | POA: Insufficient documentation

## 2010-08-27 DIAGNOSIS — M25569 Pain in unspecified knee: Secondary | ICD-10-CM | POA: Insufficient documentation

## 2010-08-27 DIAGNOSIS — M25669 Stiffness of unspecified knee, not elsewhere classified: Secondary | ICD-10-CM | POA: Insufficient documentation

## 2010-09-01 ENCOUNTER — Ambulatory Visit: Payer: Medicare Other | Admitting: Physical Therapy

## 2010-09-03 ENCOUNTER — Ambulatory Visit: Payer: Medicare Other | Admitting: Sports Medicine

## 2010-09-04 ENCOUNTER — Ambulatory Visit: Payer: Medicare Other | Admitting: Physical Therapy

## 2010-09-08 ENCOUNTER — Ambulatory Visit: Payer: Medicare Other | Admitting: Physical Therapy

## 2010-09-10 ENCOUNTER — Encounter: Payer: Medicare Other | Admitting: Physical Therapy

## 2010-09-16 ENCOUNTER — Encounter: Payer: Self-pay | Admitting: Sports Medicine

## 2010-09-16 ENCOUNTER — Ambulatory Visit (INDEPENDENT_AMBULATORY_CARE_PROVIDER_SITE_OTHER): Payer: Medicare Other | Admitting: Sports Medicine

## 2010-09-16 VITALS — BP 136/81 | HR 49

## 2010-09-16 DIAGNOSIS — S83289A Other tear of lateral meniscus, current injury, unspecified knee, initial encounter: Secondary | ICD-10-CM

## 2010-09-16 DIAGNOSIS — S83207A Unspecified tear of unspecified meniscus, current injury, left knee, initial encounter: Secondary | ICD-10-CM | POA: Insufficient documentation

## 2010-09-16 DIAGNOSIS — S83271A Complex tear of lateral meniscus, current injury, right knee, initial encounter: Secondary | ICD-10-CM

## 2010-09-16 DIAGNOSIS — M25562 Pain in left knee: Secondary | ICD-10-CM

## 2010-09-16 DIAGNOSIS — M25569 Pain in unspecified knee: Secondary | ICD-10-CM

## 2010-09-16 DIAGNOSIS — G2589 Other specified extrapyramidal and movement disorders: Secondary | ICD-10-CM

## 2010-09-16 MED ORDER — CLONAZEPAM 0.5 MG PO TABS: 0.5000 mg | ORAL_TABLET | Freq: Two times a day (BID) | ORAL | Status: DC

## 2010-09-16 NOTE — Patient Instructions (Signed)
Start taking 2 klonopin at bedtime Try biking to help with knee bend and quad strength Try knee sleeve during exercise Follow up in 6 weeks

## 2010-09-16 NOTE — Assessment & Plan Note (Signed)
Left knee pain is clearly improving. Of interest some of his most severe pain is when he gets a leg cramp forces as many and too much flexion at night With this in mind he will continued on medications as before. We gave him some simple knee exercises and will gradually have him restart some biking or stationary biking.  He has played tennis one time. He did have more pain after that. I suggested that he continuously for the next 2 weeks before trying any additional playing. I also suggested trying a compression sleeve for the left knee while playing.

## 2010-09-16 NOTE — Assessment & Plan Note (Signed)
Based on MRI his changes are primarily degenerative. We will need to have him use good support when playing tennis. He will be to maintain good quadriceps and hip strength and seems to have good strength on exam today.  Biking would be a good adjunctive training to keep the knee more stable.

## 2010-09-16 NOTE — Assessment & Plan Note (Signed)
With nightly leg cramps in his left leg I increased his Klonopin from 0.5 mg 1.0 mg at night. He was given a prescription for this and will review it further with his family physician on return to clinic

## 2010-09-16 NOTE — Progress Notes (Signed)
  Subjective:    Patient ID: Ryan [name], male    DOB: 1941-02-04, 70 y.o.   MRN: [id number]  HPI  Pt presents to clinic for f/u of left knee pain which he states is about 50% improved.  Does still have some lt knee pain that he uses meloxicam once daily, and tramadol twice daily to control, and this regimen is helpful.  States the aspiration and steroid injection he had at last visit was significantly helpful to relieve the immediate pain.  Has not been playing tennis since last visit.  Has done 3-4 PT sessions, but does not feel this has been helpful.  Has left leg cramping especially at night, took 2 of his klonopin last night, and he did not have the leg cramping.   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 He notes that he is averaging 1 significant leg cramps per night sometimes it starts in the hamstrings but most of time in the calf  Review of Systems     Objective:   Physical Exam  Almost full extension lt knee -he is -3 Mild suprapatellar pouch swelling Rt knee full flexion and neg mcmurray's Pain at 110 deg flexion on lt, small baker's cyst Negative mcmurray's on lt Ligaments stable on lt knee Good quad strength bilat Left leg slightly swollen compared to rt Good calf definition on heel raise bilat Neg straight leg raise  MRI reviewed There is diffuse signal in the medial and lateral meniscus of the left knee This is associated with thinning of the meniscal cartilage and of the articular cartilage more laterally than medially He does not show signs of a significant tear but more of degenerative change He does not show extensive  arthritis      Assessment & Plan:

## 2010-09-23 ENCOUNTER — Encounter: Payer: Self-pay | Admitting: Sports Medicine

## 2010-09-29 ENCOUNTER — Other Ambulatory Visit: Payer: Self-pay | Admitting: Family Medicine

## 2010-09-29 DIAGNOSIS — E785 Hyperlipidemia, unspecified: Secondary | ICD-10-CM

## 2010-09-29 MED ORDER — ATORVASTATIN CALCIUM 10 MG PO TABS: 10.0000 mg | ORAL_TABLET | Freq: Every day | ORAL | Status: DC

## 2010-09-29 NOTE — Telephone Encounter (Signed)
Some confusion re dosing.  He has been taking 10 mg daily without problem.  Will continue that dose.

## 2010-10-28 ENCOUNTER — Ambulatory Visit (INDEPENDENT_AMBULATORY_CARE_PROVIDER_SITE_OTHER): Payer: Medicare Other | Admitting: Sports Medicine

## 2010-10-28 DIAGNOSIS — M25569 Pain in unspecified knee: Secondary | ICD-10-CM

## 2010-10-28 DIAGNOSIS — S83289A Other tear of lateral meniscus, current injury, unspecified knee, initial encounter: Secondary | ICD-10-CM

## 2010-10-28 DIAGNOSIS — M25562 Pain in left knee: Secondary | ICD-10-CM

## 2010-10-28 DIAGNOSIS — S83271A Complex tear of lateral meniscus, current injury, right knee, initial encounter: Secondary | ICD-10-CM

## 2010-10-28 NOTE — Progress Notes (Signed)
  Subjective:    Patient ID: Ryan [name], male    DOB: 06-10-1940, 70 y.o.   MRN: [id number]  HPI  Ryan [name] returns for follow up of Lt Knee.  Definitely improved w drainage of effusion and steroid injection. Has continued to hurt less by about 50%.  MRI showed loss of joint space medially. Both medial compt and PF chondromalacia.  Degenerative meniscus tears.  Residual from old medial gastroc tear and DVT.  Ryan [name] has returned to tennis but even w brace w medial stays Ryan [name] gets several quick episodes of kneeing giving out 2/2 pain that resolve quickly.  Twisting causes some instability.  Comes to discuss options.  Review of Systems     Objective:   Physical Exam NAD  Chronic DJD changes noted in left knee / RT knee appears more normal Mild suprapatellar effusion TTP along medial joint line Clicking w McMurray test Pain on patellar compression No TTP over patellar tendon or quad tendon  Good quad, hamstring and hip strength       Assessment & Plan:

## 2010-10-28 NOTE — Assessment & Plan Note (Signed)
While he has a mix of degenrative meniscus problems as well as some DJD of both medical joint and PF joint he does have mechanical sxs  Dr. Thomasena Edis has worked with his family before and I would like to refer him to see Mardelle Matte for an opinion as to whether arthroscopy might be able to lessen his sxs and allow him to keep up his tennis

## 2010-10-28 NOTE — Assessment & Plan Note (Signed)
Pain is much  Improved and he is now using OTC meds as needed  Has Mobic and tramadol if needed

## 2010-12-17 ENCOUNTER — Other Ambulatory Visit: Payer: Self-pay | Admitting: *Deleted

## 2010-12-17 DIAGNOSIS — R2240 Localized swelling, mass and lump, unspecified lower limb: Secondary | ICD-10-CM

## 2010-12-17 DIAGNOSIS — R29898 Other symptoms and signs involving the musculoskeletal system: Secondary | ICD-10-CM

## 2010-12-17 DIAGNOSIS — I80299 Phlebitis and thrombophlebitis of other deep vessels of unspecified lower extremity: Secondary | ICD-10-CM

## 2010-12-17 DIAGNOSIS — H9190 Unspecified hearing loss, unspecified ear: Secondary | ICD-10-CM

## 2011-01-28 ENCOUNTER — Ambulatory Visit (INDEPENDENT_AMBULATORY_CARE_PROVIDER_SITE_OTHER): Payer: Medicare Other | Admitting: *Deleted

## 2011-01-28 DIAGNOSIS — Z23 Encounter for immunization: Secondary | ICD-10-CM

## 2011-03-19 ENCOUNTER — Other Ambulatory Visit: Payer: Self-pay | Admitting: *Deleted

## 2011-03-19 MED ORDER — CLONAZEPAM 0.5 MG PO TABS: 1.0000 mg | ORAL_TABLET | Freq: Every day | ORAL | Status: DC

## 2011-05-06 ENCOUNTER — Ambulatory Visit (INDEPENDENT_AMBULATORY_CARE_PROVIDER_SITE_OTHER): Payer: Medicare Other | Admitting: Sports Medicine

## 2011-05-06 ENCOUNTER — Encounter: Payer: Self-pay | Admitting: Sports Medicine

## 2011-05-06 VITALS — BP 155/70 | HR 73

## 2011-05-06 DIAGNOSIS — S83207A Unspecified tear of unspecified meniscus, current injury, left knee, initial encounter: Secondary | ICD-10-CM

## 2011-05-06 DIAGNOSIS — IMO0002 Reserved for concepts with insufficient information to code with codable children: Secondary | ICD-10-CM

## 2011-05-06 DIAGNOSIS — M7750 Other enthesopathy of unspecified foot: Secondary | ICD-10-CM

## 2011-05-06 DIAGNOSIS — M7740 Metatarsalgia, unspecified foot: Secondary | ICD-10-CM

## 2011-05-06 NOTE — Patient Instructions (Addendum)
You have metatarsalgia that is causing your forefoot pain  Try Glucosamine/chondroitin - 750/600 mg tablets- 2 tablets per day.  Try this for 3 months to see if this helps joint pain Fransisco Hertz brand from [employer] is good  Please follow up as needed  Thank you for seeing [place] today!

## 2011-05-06 NOTE — Assessment & Plan Note (Signed)
For lack of full extension he should continue working on improving the motion of his left knee  He may need to add an extra insult of the left foot to correct some of the acquired leg length difference  Functionally he is doing pretty well in spite of a 5 extension deficit

## 2011-05-06 NOTE — Assessment & Plan Note (Signed)
We added an additional metatarsal pad to the one built into the [employer] comfort shoes  He was able to walk in this without pain although he did feel some pressure on the foot  He is to continue trying either a medium or small metatarsal pad on this left foot over the next month to see if this will relieve his pain

## 2011-05-06 NOTE — Progress Notes (Signed)
  Subjective:    Patient ID: Ryan [name], male    DOB: 12-Feb-1940, 71 y.o.   MRN: [id number]  HPI  Pt presents to clinic for f/u of lt knee pain which he states is improving, but still has some pain. Able to return to tennis after surgery and physical therapy Left knee is still unable to fully extend Pain is sharp and shooting lt patella.  Taking ibuprofen for pain.    Has developed lt forefoot pain on lt.  Wearing finn comfort shoes, and had them padded at the [employer]. This foot pain is new since he had to walk with limp because of his knee. We noted problems with hammertoes in the past.  Plays tennis 4-5 days per week.    Review of Systems     Objective:   Physical Exam  Loss of transverse arch with pronation and shift of lt mid foot Hammer toes 3-5 on rt with bunnionette Hammer toe 3-4 on lt Splaying b/t 2-3 bilat with med deviation of 2nd toe Large morton's callus under MT head 2, 3-4 are also down on lt, 2nd MT ttp Flattening of transverse arch on rt-No morton's callus- down on MT 4, but not completely down 2-3 MT  Great toe motion on rt is good Rt 5th toe partially rotated  Great to motion on lt good Hammering of 5th, but no rotation on lt Lt leg 1 cm shorter than rt      Assessment & Plan:

## 2011-06-26 ENCOUNTER — Ambulatory Visit (INDEPENDENT_AMBULATORY_CARE_PROVIDER_SITE_OTHER): Payer: Medicare Other | Admitting: Family Medicine

## 2011-06-26 ENCOUNTER — Encounter: Payer: Self-pay | Admitting: Family Medicine

## 2011-06-26 VITALS — BP 102/66 | HR 52 | Temp 98.4°F | Ht 75.0 in | Wt 192.6 lb

## 2011-06-26 DIAGNOSIS — E785 Hyperlipidemia, unspecified: Secondary | ICD-10-CM

## 2011-06-26 DIAGNOSIS — M25562 Pain in left knee: Secondary | ICD-10-CM

## 2011-06-26 DIAGNOSIS — M25569 Pain in unspecified knee: Secondary | ICD-10-CM

## 2011-06-26 LAB — COMPLETE METABOLIC PANEL WITH GFR
ALT: 17 U/L (ref 0–53)
Albumin: 4.3 g/dL (ref 3.5–5.2)
Alkaline Phosphatase: 70 U/L (ref 39–117)
CO2: 28 mEq/L (ref 19–32)
Glucose, Bld: 96 mg/dL (ref 70–99)
Potassium: 4.7 mEq/L (ref 3.5–5.3)
Sodium: 142 mEq/L (ref 135–145)
Total Bilirubin: 0.6 mg/dL (ref 0.3–1.2)
Total Protein: 6.9 g/dL (ref 6.0–8.3)

## 2011-06-26 LAB — LDL CHOLESTEROL, DIRECT: Direct LDL: 87 mg/dL

## 2011-06-26 NOTE — Assessment & Plan Note (Signed)
Long discussion about benefits of injections and about knee replacement surgery.  He will pursue these options further.

## 2011-06-26 NOTE — Patient Instructions (Signed)
You are in great shape I will let you know your blood work results Don't be too afraid of knee surgery.  Find the sweet spot in timing for you See Arlys John at your convenience in our office for your free annual Medicare wellness exam

## 2011-06-26 NOTE — Assessment & Plan Note (Signed)
Check LDL on atorvastatin.

## 2011-06-26 NOTE — Progress Notes (Signed)
  Subjective:    Patient ID: Ryan [name], male    DOB: 05/28/1940, 71 y.o.   MRN: [id number]  HPI  Patient not here for annual exam.  Here for knee pain and hyperlipidemia. Patient has painful left knee.  Seen by both ortho and SM.  Had arthroscopy one year ago with minimal relief.  Finding was a surprising amount of arthritis.  STATES CANNOT FULLY STRAIGHTEN KNEE.  Patient now taking atorvastatin - switched from simvastatin due to myalgias.  Has not had LDL since.  Too early for full lipid panel    Review of Systems     Objective:   Physical Exam Cannot fully extend Lt. Knee: crepitis Wt noted.   Cardiac RRR without m Lungs clear Abd benign       Assessment & Plan:

## 2011-06-29 ENCOUNTER — Encounter: Payer: Self-pay | Admitting: Family Medicine

## 2011-07-07 ENCOUNTER — Encounter: Payer: Self-pay | Admitting: Home Health Services

## 2011-07-07 ENCOUNTER — Ambulatory Visit (INDEPENDENT_AMBULATORY_CARE_PROVIDER_SITE_OTHER): Payer: Medicare Other | Admitting: Home Health Services

## 2011-07-07 VITALS — BP 132/79 | HR 61 | Temp 98.1°F | Ht 75.0 in | Wt 196.3 lb

## 2011-07-07 DIAGNOSIS — Z0000 Encounter for general adult medical examination without abnormal findings: Secondary | ICD-10-CM

## 2011-07-07 NOTE — Progress Notes (Signed)
Patient here for annual wellness visit, patient reports: Risk Factors/Conditions needing evaluation or treatment: Pt does not have any risk factors that need evaluation. Home Safety: Pt lives with wife in 1 story home.  Pt reports having smoke detectors and adaptive equipment in bathroom. Other Information: Corrective lens: Pt wears daily corrective lens.  Visits eye dr annually.  Pt has had several eye surgeries for cataracts, detached retna Dentures: Pt does not have dentures, visits dentist semi-annually. Memory: Pt denies memory problems Patient's Mini Mental Score (recorded in doc. flowsheet): 30  Balance/Gait: Pt has osteoarthritis in left knee, cause a slight limp. Is currently under care of orthopedic Balance Abnormal Patient value  Sitting balance    Sit to stand    Attempts to arise    Immediate standing balance    Standing balance    Nudge    Eyes closed- Romberg    Tandem stance    Back lean    Neck Rotation    360 degree turn    Sitting down     Gait Abnormal Patient value  Initiation of gait    Step length-left    Step length-right    Step height-left    Step height-right    Step symmetry x   Step continuity    Path deviation    Trunk movement  Sways a bit  Walking stance        Annual Wellness Visit Requirements Recorded Today In  Medical, family, social history Past Medical, Family, Social History Section  Current providers Care team  Current medications Medications  Wt, BP, Ht, BMI Vital signs  Tobacco, alcohol, illicit drug use History  ADL Nurse Assessment  Depression Screening Nurse Assessment  Cognitive impairment Nurse Assessment  Mini Mental Status Document Flowsheet  Fall Risk Nurse Assessment  Home Safety Progress Note  End of Life Planning (welcome visit) Social Documentation  Medicare preventative services Progress Note  Risk factors/conditions needing evaluation/treatment Progress Note  Personalized health advice Patient  Instructions, goals, letter  Diet & Exercise Social Documentation  Emergency Contact Social Documentation  Seat Belts Social Documentation  Sun exposure/protection Social Documentation    Medicare Prevention Plan:   Recommended Medicare Prevention Screenings Men over 65 Test For Frequency Date of Last- BOLD if needed  Colorectal Cancer 1-10 yrs 2/10  Prostate Cancer Never or yearly Under care of urologist  Aortic Aneurysm Once if 65-75 with hx of smoking discussed past history of smoking, and family history  Cholesterol 5 yrs 6/12  Diabetes yearly 5/13  HIV yearly declined  Influenza Shot yearly 12/12  Pneumonia Shot once 2/10  Zostavax Shot once 3/10

## 2011-07-08 NOTE — Patient Instructions (Signed)
1. Continue to maintain your weight be focusing on 3-5 fruits and vegetables a day. 2. Bring in a copy of your living will for Dr. Leveda Anna. 3. Continue to maintain an active lifestyle with exercises such as tennis.

## 2011-07-09 ENCOUNTER — Encounter: Payer: Self-pay | Admitting: Home Health Services

## 2011-07-09 NOTE — Progress Notes (Signed)
Patient ID: Ryan [name], male   DOB: May 07, 1940, 71 y.o.   MRN: [id number] I have reviewed this visit and discussed with Arlys John and agree with her documentation

## 2011-07-10 ENCOUNTER — Telehealth: Payer: Self-pay | Admitting: Family Medicine

## 2011-07-10 DIAGNOSIS — Z136 Encounter for screening for cardiovascular disorders: Secondary | ICD-10-CM | POA: Insufficient documentation

## 2011-07-10 NOTE — Telephone Encounter (Signed)
Called and made appt for pt to have US Aorta done on Wednesday July 15, 2011 @ 1115 AM at  [facility] radiology 1st floor. Pt agreed.Loralee Pacas [place]

## 2011-07-10 NOTE — Telephone Encounter (Signed)
Patient is calling because when he met with Rosalita Chessman, she suggested that he have an Ultrasound on the Aorta and he would like a recommendation of where he should go.

## 2011-07-14 ENCOUNTER — Other Ambulatory Visit (HOSPITAL_COMMUNITY): Payer: Medicare Other

## 2011-07-15 ENCOUNTER — Encounter: Payer: Self-pay | Admitting: Family Medicine

## 2011-07-15 ENCOUNTER — Ambulatory Visit (HOSPITAL_COMMUNITY)
Admission: RE | Admit: 2011-07-15 | Discharge: 2011-07-15 | Disposition: A | Discharge status: Home / Self Care | Payer: Medicare Other | Source: Ambulatory Visit | Attending: Family Medicine | Admitting: Family Medicine

## 2011-07-15 DIAGNOSIS — Z136 Encounter for screening for cardiovascular disorders: Secondary | ICD-10-CM | POA: Insufficient documentation

## 2011-07-16 ENCOUNTER — Ambulatory Visit: Payer: Medicare Other | Admitting: Sports Medicine

## 2011-08-25 ENCOUNTER — Ambulatory Visit (INDEPENDENT_AMBULATORY_CARE_PROVIDER_SITE_OTHER): Payer: Medicare Other | Admitting: Sports Medicine

## 2011-08-25 ENCOUNTER — Encounter: Payer: Self-pay | Admitting: Sports Medicine

## 2011-08-25 VITALS — BP 148/74 | HR 68

## 2011-08-25 DIAGNOSIS — M25569 Pain in unspecified knee: Secondary | ICD-10-CM

## 2011-08-25 DIAGNOSIS — S83207A Unspecified tear of unspecified meniscus, current injury, left knee, initial encounter: Secondary | ICD-10-CM

## 2011-08-25 DIAGNOSIS — M25562 Pain in left knee: Secondary | ICD-10-CM

## 2011-08-25 DIAGNOSIS — IMO0002 Reserved for concepts with insufficient information to code with codable children: Secondary | ICD-10-CM

## 2011-08-25 MED ORDER — TRAMADOL HCL 50 MG PO TABS: 50.0000 mg | ORAL_TABLET | Freq: Two times a day (BID) | ORAL | Status: AC | PRN

## 2011-08-25 NOTE — Assessment & Plan Note (Signed)
Surgery did get rid of some of the mechanical sxs but his pain is still an issue.  In addition his motion has worsened.

## 2011-08-25 NOTE — Patient Instructions (Signed)
Please try the heel lifts in your left shoe only, and continue to work on your range of motion in your left knee.  Also, try wearing the compression sleeve during activity (and at least one hour after) to prevent swelling.  You can try Tramadol for the pain.

## 2011-08-25 NOTE — Progress Notes (Signed)
  Subjective:    Patient ID: Ryan [name], male    DOB: 11/21/1940, 71 y.o.   MRN: [id number]  HPI  Ryan [name] comes in for follow up of his left knee pain.  He has DJD and had a meniscal tear, had his knee scoped last November.  He says the surgery helped him some, but he is still having significant problems with his knee.  He says his ROM is decreased, he cannot straighten the leg all the way, and cannot bend it much past 90 degrees.  He says it swells a little.  He was able to play tennis yesterday with a knee brace and had minimal discomfort.  However he says he and his wife just got back from a road trip, and the long drive bothers him.  He says that he has stiffness in the morning or when getting out of the car, and it often takes him a few minutes to get up.    He wants to know what his options for treatment are, as he is not sure he is ready to have a knee replacement surgery.   Review of Systems See HPI.     Objective:   Physical Exam BP 148/74  Pulse 68 General appearance: alert, cooperative and no distress Left Knee: Inspection reveals mild supra lateral effusion and some bony abnormalities consistent with bone spurs.  +TTP over medial and lateral joint lines. No patellar tenderness or condyle tenderness. ROM:  Full extension is about 15 degrees shy of normal standing Full flexion is about 110 degrees  Standing reveals functional leg length discrepancy due to decreased extension of left knee.       Assessment & Plan:

## 2011-08-25 NOTE — Assessment & Plan Note (Addendum)
Slightly improved with knee arthroscopy, but still significant pain and decreased ROM.  Will try to treat conservatively, but ultimately he may need total knee replacement. Will give heel pad for left side to help correct functional leg length discrepancy.  Advised wearing knee sleeve instead of brace for compression and preventing swelling. Rx tramadol for pain.  Patient advised to follow up if pain intolerable.

## 2011-09-03 ENCOUNTER — Encounter: Payer: Self-pay | Admitting: *Deleted

## 2011-09-03 NOTE — Patient Instructions (Signed)
APPT FOR SECOND OPINION FOR LEFT KNEE REPLACEMENT DR ROWAN THURS 8.1.13 AT 1045A [address] [place] [phone]   LEFT MESSAGE AT DR Despina Hick OFFICE TO CALL BACK WITH APPT [phone]

## 2011-09-03 NOTE — Progress Notes (Unsigned)
Patient ID: Ryan [name], male   DOB: 1940-03-28, 71 y.o.   MRN: [id number]

## 2011-10-20 ENCOUNTER — Other Ambulatory Visit: Payer: Self-pay | Admitting: *Deleted

## 2011-10-20 MED ORDER — CLONAZEPAM 0.5 MG PO TABS: 1.0000 mg | ORAL_TABLET | Freq: Every day | ORAL | Status: DC

## 2011-11-03 ENCOUNTER — Ambulatory Visit: Payer: Medicare Other | Admitting: Sports Medicine

## 2012-01-15 ENCOUNTER — Other Ambulatory Visit: Payer: Self-pay | Admitting: Family Medicine

## 2012-02-04 ENCOUNTER — Ambulatory Visit: Payer: Medicare Other | Admitting: Family Medicine

## 2012-02-04 ENCOUNTER — Ambulatory Visit (INDEPENDENT_AMBULATORY_CARE_PROVIDER_SITE_OTHER): Payer: Medicare Other | Admitting: Family Medicine

## 2012-02-04 ENCOUNTER — Encounter: Payer: Self-pay | Admitting: Family Medicine

## 2012-02-04 VITALS — BP 111/72 | HR 71 | Temp 97.8°F | Ht 75.0 in | Wt 193.0 lb

## 2012-02-04 DIAGNOSIS — J40 Bronchitis, not specified as acute or chronic: Secondary | ICD-10-CM

## 2012-02-04 MED ORDER — AZITHROMYCIN 500 MG PO TABS: 500.0000 mg | ORAL_TABLET | Freq: Every day | ORAL | Status: DC

## 2012-02-04 NOTE — Progress Notes (Signed)
  Subjective:    Patient ID: Ryan [name], male    DOB: 20-Apr-1940, 71 y.o.   MRN: [id number]  HPI  Sick for 2 weeks with significant cough.  Still ill.  Very slowly getting better.  Non smoker.  Denies fever or SOB.  Does have sick contact.    Review of Systems     Objective:   Physical Exam TMs nl Throat nl Neck no sig nodes Lungs clear.        Assessment & Plan:

## 2012-02-04 NOTE — Patient Instructions (Addendum)
Please sign up for my chart You have a prolonged URI or bronchitis.  Given that you have been sick for 2 weeks, it is reasonable to treat you with antibiotics

## 2012-02-05 NOTE — Assessment & Plan Note (Signed)
Seems viral but given 2 week duration, antibiotics at this time seem appropriate.

## 2012-04-20 ENCOUNTER — Other Ambulatory Visit: Payer: Self-pay | Admitting: *Deleted

## 2012-04-20 MED ORDER — CLONAZEPAM 0.5 MG PO TABS: 1.0000 mg | ORAL_TABLET | Freq: Every day | ORAL | Status: DC

## 2012-05-30 ENCOUNTER — Encounter: Payer: Self-pay | Admitting: Family Medicine

## 2012-05-30 ENCOUNTER — Other Ambulatory Visit: Payer: Self-pay | Admitting: Family Medicine

## 2012-05-30 DIAGNOSIS — D126 Benign neoplasm of colon, unspecified: Secondary | ICD-10-CM

## 2012-05-30 DIAGNOSIS — N400 Enlarged prostate without lower urinary tract symptoms: Secondary | ICD-10-CM

## 2012-05-30 DIAGNOSIS — E291 Testicular hypofunction: Secondary | ICD-10-CM

## 2012-05-30 DIAGNOSIS — E785 Hyperlipidemia, unspecified: Secondary | ICD-10-CM

## 2012-06-13 ENCOUNTER — Other Ambulatory Visit: Payer: Medicare Other

## 2012-06-13 DIAGNOSIS — N400 Enlarged prostate without lower urinary tract symptoms: Secondary | ICD-10-CM

## 2012-06-13 DIAGNOSIS — D126 Benign neoplasm of colon, unspecified: Secondary | ICD-10-CM

## 2012-06-13 DIAGNOSIS — E785 Hyperlipidemia, unspecified: Secondary | ICD-10-CM

## 2012-06-13 DIAGNOSIS — E291 Testicular hypofunction: Secondary | ICD-10-CM

## 2012-06-13 LAB — CBC
HCT: 47 % (ref 39.0–52.0)
Hemoglobin: 16.1 g/dL (ref 13.0–17.0)
RBC: 5.05 MIL/uL (ref 4.22–5.81)
RDW: 13.8 % (ref 11.5–15.5)
WBC: 4.4 10*3/uL (ref 4.0–10.5)

## 2012-06-13 LAB — LIPID PANEL
Cholesterol: 172 mg/dL (ref 0–200)
LDL Cholesterol: 91 mg/dL (ref 0–99)
Total CHOL/HDL Ratio: 2.8 Ratio
Triglycerides: 95 mg/dL (ref <150)
VLDL: 19 mg/dL (ref 0–40)

## 2012-06-13 NOTE — Progress Notes (Signed)
LABS DONE TODAY Tyan Dy 

## 2012-06-14 ENCOUNTER — Encounter: Payer: Self-pay | Admitting: Family Medicine

## 2012-06-14 LAB — COMPLETE METABOLIC PANEL WITH GFR
ALT: 22 U/L (ref 0–53)
AST: 21 U/L (ref 0–37)
Alkaline Phosphatase: 56 U/L (ref 39–117)
BUN: 26 mg/dL — ABNORMAL HIGH (ref 6–23)
Creat: 1.11 mg/dL (ref 0.50–1.35)
Total Bilirubin: 0.8 mg/dL (ref 0.3–1.2)

## 2012-06-14 LAB — TSH: TSH: 3.182 u[IU]/mL (ref 0.350–4.500)

## 2012-06-22 ENCOUNTER — Ambulatory Visit (INDEPENDENT_AMBULATORY_CARE_PROVIDER_SITE_OTHER): Payer: Medicare Other | Admitting: Family Medicine

## 2012-06-22 ENCOUNTER — Encounter: Payer: Self-pay | Admitting: Family Medicine

## 2012-06-22 VITALS — BP 108/60 | HR 68 | Temp 97.8°F | Ht 75.0 in | Wt 202.0 lb

## 2012-06-22 DIAGNOSIS — E291 Testicular hypofunction: Secondary | ICD-10-CM

## 2012-06-22 DIAGNOSIS — M1990 Unspecified osteoarthritis, unspecified site: Secondary | ICD-10-CM

## 2012-06-22 DIAGNOSIS — E785 Hyperlipidemia, unspecified: Secondary | ICD-10-CM

## 2012-06-22 DIAGNOSIS — Z0000 Encounter for general adult medical examination without abnormal findings: Secondary | ICD-10-CM

## 2012-06-22 NOTE — Patient Instructions (Addendum)
Change your meds one at a time Try off the meloxicam.  In general, tylenol is safer for mild pain even if you take every day Try off the testosterone.  Let me know if you want a blood test in three months. Take a whole atorvastitin daily.  Ok to cut back to a half if you have muscle pain. Decrease stress, increase exercise.

## 2012-06-23 DIAGNOSIS — Z0000 Encounter for general adult medical examination without abnormal findings: Secondary | ICD-10-CM | POA: Insufficient documentation

## 2012-06-23 NOTE — Assessment & Plan Note (Signed)
Healthy male with good habits and no major risk factors.

## 2012-06-23 NOTE — Assessment & Plan Note (Signed)
Trial off meloxicam.  Tylenol should be safer for OA mild pain.

## 2012-06-23 NOTE — Assessment & Plan Note (Addendum)
Calculated CAD risk which indicates he should be on moderate intensity statin therapy.  Increase atorvastatin to 10 mg daily and monitor for myalgias.  Also emphasized daily ASA.

## 2012-06-23 NOTE — Progress Notes (Signed)
  Subjective:    Patient ID: Ryan [name], male    DOB: February 15, 1940, 72 y.o.   MRN: [id number]  HPI  Annual physical Very healthy.  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  He finds he is not exercising as much as he would like. No other complaints. Nicely, he received a steroid shot in his left knee 5-6 months ago and has been virtually pain free since.   He has not been taking ASA due to concern about interaction with meloxicam Has only been using half of his testosterone dose and doing well.   HPDP up to date.  Reviewed recent blood work. Discussed recent change in cholesterol guidelines.  Had only been taking 5 mg atorvastatin daily due to previous myalgias. Healthy diet    Review of Systems No chest pain, SOB, bleeding, skin changes, bowel or bladder changes.     Objective:   Physical Exam HEENT normal Neck wihout masses Lungs clear Cardiac RRR without m or g Abd benign. Ext no edema, normal pulses.       Assessment & Plan:

## 2012-06-23 NOTE — Assessment & Plan Note (Signed)
Discussed trial off testosterone.  I am concerned about possible long term effects.

## 2012-08-25 ENCOUNTER — Encounter: Payer: Self-pay | Admitting: Family Medicine

## 2012-08-25 NOTE — Progress Notes (Signed)
Patient ID: Ryan [name], male   DOB: April 14, 1940, 72 y.o.   MRN: [id number] Received this e mail Ryan [name],   Recently developed what Ryan [name] immediately identified as gouty arthritis in second R toe.  Discontinued 81mg  Aspirin and resumed Meloxicam at half the 15 mg tablet daily.  Also started Folic Acid 400 mcg x 2  and Co Q-10 100 mg, both daily in the AM, in response to mild side effects of other meds. Still taking Atorvastatin (half a 10 mg tablet daily), Tamsulosin HCL 0.4mg  daily, and Clozazepam 0.5 mg X 2 daily, all in the evening.  Results: less muscle and joint discomfort (almost none actually, and seem to be "feeling good" as compared with "feeling OK".  I discontinued the Androgel as of my last visit with you in May.  Don't know how that has affected "T" level, but don't see any adverse consequence.  Tamsulosin seems to be keeping BPH symptoms under control.  Still very low libido, but maybe that comes with the over 70 territory.  Have taken Viagra in the past, and then Cialis.  Did not fill the last perscription I had for the latter due to high cost - $150 as I recall for relatively small quantity (maybe 10 tablets?)  Thought I might get lab work updated after Labor day to see new numbers.  Your thoughts?  any other advice?  Thanks as always for your ongoing care.  Ryan [name] replied  Ryan [name],  First, I am delighted that you are feeling well.  You know the old saying, if it ain't broke, don't fix it.  Here are my thoughts. First, gout is not a bad working diagnosis.  I have not ever checked your blood, uric acid level.  Remind me to do so next time I draw blood.  That level will help confirm or deny the gout diagnosis. Second, I updated your med list and am fine.  Fortunately, you were not on the BP med, HCTZ, which can make gout worse.  No changes needed. Third, I am happy to prescribe Viagra if you wish.  Just let me know.  Does not help desire.  Does help maintain erections. Finally, I just  did a bunch of labs in May and you're really not due for anything.  I could recheck your testosterone level off meds - but why if we're not going to make changes as a result of the lab test.  I could do a uric acid as above, but it can wait.    I'd probably hold off until the fall.  Then you can get labs and a flu shot.  How does this sound? Ryan [name]  --  [name] [address] [place], [place]  [address] [phone] (home) [phone] (cell) [name]

## 2012-08-29 ENCOUNTER — Encounter: Payer: Self-pay | Admitting: Home Health Services

## 2012-09-19 ENCOUNTER — Other Ambulatory Visit: Payer: Self-pay | Admitting: Family Medicine

## 2012-09-19 MED ORDER — TADALAFIL 10 MG PO TABS: 10.0000 mg | ORAL_TABLET | Freq: Every day | ORAL | Status: DC | PRN

## 2012-09-23 ENCOUNTER — Other Ambulatory Visit: Payer: Self-pay | Admitting: *Deleted

## 2012-09-26 MED ORDER — CLONAZEPAM 0.5 MG PO TABS: 1.0000 mg | ORAL_TABLET | Freq: Every day | ORAL | Status: DC

## 2012-12-15 ENCOUNTER — Other Ambulatory Visit: Payer: Self-pay

## 2013-01-04 ENCOUNTER — Other Ambulatory Visit: Payer: Self-pay | Admitting: Family Medicine

## 2013-01-04 DIAGNOSIS — N400 Enlarged prostate without lower urinary tract symptoms: Secondary | ICD-10-CM

## 2013-01-04 MED ORDER — TAMSULOSIN HCL 0.4 MG PO CAPS: 0.4000 mg | ORAL_CAPSULE | Freq: Every day | ORAL | Status: DC

## 2013-01-04 NOTE — Assessment & Plan Note (Signed)
Refilled per e mail request

## 2013-02-21 ENCOUNTER — Other Ambulatory Visit: Payer: Self-pay | Admitting: Family Medicine

## 2013-02-21 MED ORDER — CLONAZEPAM 1 MG PO TABS: 1.0000 mg | ORAL_TABLET | Freq: Every day | ORAL | Status: DC

## 2013-06-19 ENCOUNTER — Telehealth: Payer: Self-pay | Admitting: Family Medicine

## 2013-06-19 NOTE — Telephone Encounter (Signed)
E mail correspondence: First the colonoscopy. Probably a good idea. You had an adenomatous polyp (the kind that can turn into cancer) in 2005. My records indicate your last colonoscopy was in 2010 and I believe that one was clean. Since you have had a previous polyp, every five year colonoscopy is a reasonable idea. Ryan [name] is retired (I am pretty sure.) Happy upcoming birthday. 73 is a big milestone. I hope you are still enjoying good health - with that becoming a relative term with each passing year. Your last annual visit with me was 06/22/13 - so you are right on time with your questions - some of which we can cover in person. The answer to most of your questions is that you should see me soon and we can discuss then. I think in previous years I have ordered the blood work before your visit so we had the results when we talked. Is that the way you want to handle this year?  Do you need any refills before I see you? If yes, let me know what and which pharmacy. I don't foresee any changes in your medication regimen. You seem to imply that tamsulosin and cialis are interchangable. Really, they are not. Tamsulosin primarily helps your urine flow. Cialis primarily helps erectile dysfunction. We can talk about whether you still want/need both, one or neither.  From: [name]> Sent: Monday, Jun 19, 2013 11:25 AM To: [name], Ryan Subject: Checking In  Birthday next Sunday - 73  Got letter from [place] suggesting repeat colonoscopy based on personal history of colon polyps. Ryan [name] did last one some years ago - this letter from Ryan [name] may be retired??). Your suggestion?  Would like to discuss meds with you, perhaps in conjunction with updated blood work. Currently taking Atorvastatin 5 mg (half of prescribed 10 mg), Tamsulosin HCL 0.4 mg, Aspirin 81mg , and Clonazepam 1 mg. Have thought about Ciallis in lieu of Tamsulosin; last time I checked Ciallis was out of  sight price wise, and not covered by Medicare for BPH symptoms despite the advertisements. Maybe I just need to get used to being 73.  Last blood donor appointment other indicators were good: pulse 49, BP 115/75. Problem areas: general muscle/join pain (osteoarthritis??); itchy skin.  Next step?  Best personal regards,  Ryan [name]   --  [address] [place], [place] [address] [phone] (home) [phone] (cell) [name]

## 2013-06-20 ENCOUNTER — Other Ambulatory Visit: Payer: Self-pay | Admitting: Family Medicine

## 2013-06-20 DIAGNOSIS — I498 Other specified cardiac arrhythmias: Secondary | ICD-10-CM

## 2013-06-20 DIAGNOSIS — E785 Hyperlipidemia, unspecified: Secondary | ICD-10-CM

## 2013-06-20 DIAGNOSIS — E291 Testicular hypofunction: Secondary | ICD-10-CM

## 2013-07-04 ENCOUNTER — Other Ambulatory Visit: Payer: Medicare Other

## 2013-07-04 DIAGNOSIS — E785 Hyperlipidemia, unspecified: Secondary | ICD-10-CM

## 2013-07-04 DIAGNOSIS — E291 Testicular hypofunction: Secondary | ICD-10-CM

## 2013-07-04 DIAGNOSIS — I498 Other specified cardiac arrhythmias: Secondary | ICD-10-CM

## 2013-07-04 LAB — LIPID PANEL
CHOL/HDL RATIO: 2.4 ratio
Cholesterol: 162 mg/dL (ref 0–200)
HDL: 68 mg/dL (ref >39)
LDL CALC: 85 mg/dL (ref 0–99)
Triglycerides: 46 mg/dL (ref <150)
VLDL: 9 mg/dL (ref 0–40)

## 2013-07-04 LAB — TSH: TSH: 1.951 u[IU]/mL (ref 0.350–4.500)

## 2013-07-04 LAB — COMPLETE METABOLIC PANEL WITH GFR
ALBUMIN: 4 g/dL (ref 3.5–5.2)
ALK PHOS: 49 U/L (ref 39–117)
ALT: 20 U/L (ref 0–53)
AST: 23 U/L (ref 0–37)
BUN: 23 mg/dL (ref 6–23)
CO2: 26 meq/L (ref 19–32)
Calcium: 8.8 mg/dL (ref 8.4–10.5)
Chloride: 108 mEq/L (ref 96–112)
Creat: 1.11 mg/dL (ref 0.50–1.35)
GFR, EST NON AFRICAN AMERICAN: 65 mL/min
GFR, Est African American: 75 mL/min
GLUCOSE: 114 mg/dL — AB (ref 70–99)
POTASSIUM: 4.2 meq/L (ref 3.5–5.3)
SODIUM: 141 meq/L (ref 135–145)
TOTAL PROTEIN: 5.9 g/dL — AB (ref 6.0–8.3)
Total Bilirubin: 0.8 mg/dL (ref 0.2–1.2)

## 2013-07-04 LAB — TESTOSTERONE: Testosterone: 559 ng/dL (ref 300–890)

## 2013-07-04 NOTE — Progress Notes (Signed)
TSH,CMP,FLP AND TESTOSTERONE DONE TODAY Ryan [name]

## 2013-07-07 ENCOUNTER — Encounter: Payer: Self-pay | Admitting: Family Medicine

## 2013-07-07 ENCOUNTER — Ambulatory Visit (INDEPENDENT_AMBULATORY_CARE_PROVIDER_SITE_OTHER): Payer: Medicare Other | Admitting: Family Medicine

## 2013-07-07 VITALS — BP 124/70 | HR 60 | Temp 97.6°F | Ht 75.0 in | Wt 208.4 lb

## 2013-07-07 DIAGNOSIS — R739 Hyperglycemia, unspecified: Secondary | ICD-10-CM | POA: Insufficient documentation

## 2013-07-07 DIAGNOSIS — Z0000 Encounter for general adult medical examination without abnormal findings: Secondary | ICD-10-CM

## 2013-07-07 DIAGNOSIS — Z23 Encounter for immunization: Secondary | ICD-10-CM

## 2013-07-07 DIAGNOSIS — D126 Benign neoplasm of colon, unspecified: Secondary | ICD-10-CM

## 2013-07-07 DIAGNOSIS — E8809 Other disorders of plasma-protein metabolism, not elsewhere classified: Secondary | ICD-10-CM

## 2013-07-07 DIAGNOSIS — E778 Other disorders of glycoprotein metabolism: Secondary | ICD-10-CM | POA: Insufficient documentation

## 2013-07-07 DIAGNOSIS — R7309 Other abnormal glucose: Secondary | ICD-10-CM

## 2013-07-07 LAB — POCT URINALYSIS DIPSTICK
BILIRUBIN UA: NEGATIVE
Blood, UA: NEGATIVE
Glucose, UA: NEGATIVE
KETONES UA: NEGATIVE
Leukocytes, UA: NEGATIVE
NITRITE UA: NEGATIVE
PH UA: 6.5
PROTEIN UA: NEGATIVE
Spec Grav, UA: 1.02
Urobilinogen, UA: 0.2

## 2013-07-07 LAB — POCT GLYCOSYLATED HEMOGLOBIN (HGB A1C): HEMOGLOBIN A1C: 5.1

## 2013-07-07 NOTE — Assessment & Plan Note (Signed)
He needs 5 y colonoscopy

## 2013-07-07 NOTE — Assessment & Plan Note (Signed)
Diet and liver fine.  Will check UA to be sure no proteinura.

## 2013-07-07 NOTE — Assessment & Plan Note (Signed)
Very healthy male with no important risk factors.

## 2013-07-07 NOTE — Assessment & Plan Note (Signed)
Check A1C due to elevated fasting blood sugar

## 2013-07-07 NOTE — Progress Notes (Signed)
   Subjective:    Patient ID: Ryan [name], male    DOB: 30-Nov-1940, 73 y.o.   MRN: [id number]  HPI Annual physical.  No complaints.  Actually, left knee is doing much better than expected.  No current plans for surgery. Regular exercise includes tennis Healthy wt - Although we both recognize it has crept up.  He will try hard to at least maintain. HPDP up to date except Needs 5 year colonoscopy for colon polyps.  He has already scheduled. Has not received prevnar  On statin and cholesterol is great Other med problems are stable  Fasting blood work showed elevated BS and borderline low protein.    Review of Systems denies CP, SOB, bowel, bladder or wt change, bleeding, concerning skin lesions     Objective:   Physical ExamHEENT normal Neck supple without thyromegaly Lungs clear Cardiac RRR without m or g Abd benign Ext bilateral trace edema Neuro WNL        Assessment & Plan:

## 2013-07-07 NOTE — Patient Instructions (Signed)
You look marvelous You received a prevnar (pneumonia) vaccine today. I will call with the new results Make sure you follow through with colonoscopy Call me when you get flu shot in the fall See me in a year.

## 2013-07-09 ENCOUNTER — Other Ambulatory Visit: Payer: Self-pay | Admitting: Family Medicine

## 2013-07-18 ENCOUNTER — Ambulatory Visit (INDEPENDENT_AMBULATORY_CARE_PROVIDER_SITE_OTHER): Payer: Medicare Other | Admitting: Home Health Services

## 2013-07-18 ENCOUNTER — Encounter: Payer: Self-pay | Admitting: Home Health Services

## 2013-07-18 VITALS — BP 124/70 | HR 60 | Temp 98.6°F | Ht 75.5 in | Wt 200.9 lb

## 2013-07-18 DIAGNOSIS — Z0000 Encounter for general adult medical examination without abnormal findings: Secondary | ICD-10-CM

## 2013-07-18 NOTE — Progress Notes (Signed)
Patient here for annual wellness visit, patient reports: Risk Factors/Conditions needing evaluation or treatment: Pt. denies having any risk factors that need evaluation.  Home Safety: Pt lives at home, with wife in a 1 story home.  Pt reports having smoke alarms and not having adaptive equipment.  Other Information: Corrective lens: Pt has corrective lens, has annual eye exams. Dentures: Pt denies having dentures, has annual dental exams. Memory: Pt denies memory problems.  Patient's Mini Mental Score (recorded in doc. flowsheet): 30 Bladder:  Pt denies problems with bladder control.  ADL/IADL:  Pt reports independence in all functions. Balance/Gait: Pt reports 0 falls in the past year.  We discussed home safety and fall prevention.      Annual Wellness Visit Requirements Recorded Today In  Medical, family, social history Past Medical, Family, Social History Section  Current providers Care team  Current medications Medications  Wt, BP, Ht, BMI Vital signs        Tobacco, alcohol, illicit drug use History  ADL Nurse Assessment  Depression Screening Nurse Assessment  Cognitive impairment Nurse Assessment  Mini Mental Status Document Flowsheet  Fall Risk Fall/Depression  Home Safety Progress Note  End of Life Planning (welcome visit) Social Documentation  Medicare preventative services Progress Note  Risk factors/conditions needing evaluation/treatment Progress Note  Personalized health advice Patient Instructions, goals, letter  Diet & Exercise Social Documentation  Emergency Contact Social Documentation  Seat Belts Social Documentation  Sun exposure/protection Social Documentation

## 2013-07-19 ENCOUNTER — Encounter: Payer: Self-pay | Admitting: Home Health Services

## 2013-07-19 NOTE — Progress Notes (Signed)
Patient ID: Ryan [name], male   DOB: [birth year], 73 y.o.   MRN: [id number] I have reviewed this visit and discussed with Lamont Dowdy and agree with her documentation

## 2013-07-19 NOTE — Progress Notes (Signed)
I have reviewed and agree with [facility] Cover's documentation.

## 2013-08-08 ENCOUNTER — Ambulatory Visit (INDEPENDENT_AMBULATORY_CARE_PROVIDER_SITE_OTHER): Payer: Medicare Other | Admitting: Family Medicine

## 2013-08-08 ENCOUNTER — Encounter: Payer: Self-pay | Admitting: Family Medicine

## 2013-08-08 VITALS — BP 133/77 | HR 48 | Ht 76.0 in | Wt 200.0 lb

## 2013-08-08 DIAGNOSIS — S92919A Unspecified fracture of unspecified toe(s), initial encounter for closed fracture: Secondary | ICD-10-CM

## 2013-08-08 DIAGNOSIS — S92502A Displaced unspecified fracture of left lesser toe(s), initial encounter for closed fracture: Secondary | ICD-10-CM

## 2013-08-08 NOTE — Patient Instructions (Signed)
You do have a 5th toe proximal phalanx fracture. Typically these take 4-6 weeks to heal. It's ok to play sports as tolerated with this type of fracture because they heal so well. Buddy tape to the 4th toe until your pain is resolved. Consider a hard soled shoe or boot for comfort if needed. Icing 15 minutes at a time 3-4 times a day;  Elevate as needed for swelling and pain. Tylenol or ibuprofen if needed for pain. Follow up with me only if you're still having problems a month from now.

## 2013-08-10 ENCOUNTER — Encounter: Payer: Self-pay | Admitting: Family Medicine

## 2013-08-10 DIAGNOSIS — S92502A Displaced unspecified fracture of left lesser toe(s), initial encounter for closed fracture: Secondary | ICD-10-CM | POA: Insufficient documentation

## 2013-08-10 NOTE — Progress Notes (Signed)
Patient ID: Ryan [name], male   DOB: 07-17-1940, 73 y.o.   MRN: [id number]  PCP: [name], MD  Subjective:   HPI: Patient is a 73 y.o. male here for left foot injury.  Patient reports he accidentally jammed left 5th toe on an iron chair leg about 10 days ago. No prior injuries + swelling and bruising. Pain with walking. No treatment to date.  Past Medical History  Diagnosis Date  . Arthritis   . Hyperlipidemia     Current Outpatient Prescriptions on File Prior to Visit  Medication Sig Dispense Refill  . aspirin 81 MG tablet OTC daily       . atorvastatin (LIPITOR) 10 MG tablet TAKE 1 TABLET ONCE DAILY.  90 tablet  3  . clonazePAM (KLONOPIN) 1 MG tablet Take 1 tablet (1 mg total) by mouth at bedtime.  30 tablet  5  . meloxicam (MOBIC) 15 MG tablet Take 15 mg by mouth daily.      . tamsulosin (FLOMAX) 0.4 MG CAPS capsule Take 1 capsule (0.4 mg total) by mouth daily.  90 capsule  3   No current facility-administered medications on file prior to visit.    History reviewed. No pertinent past surgical history.  Allergies  Allergen Reactions  . Simvastatin     REACTION: Low level CK elevation  . Warfarin Sodium     History   Social History  . Marital Status: Married    Spouse Name: [name]    Number of Children: 6  . Years of Education: 16   Occupational History  . retired-CFO    Social History Main Topics  . Smoking status: Former Smoker    Quit date: 02/10/1980  . Smokeless tobacco: Never Used  . Alcohol Use: 7.0 oz/week    14 drink(s) per week  . Drug Use: No  . Sexual Activity: Not on file   Other Topics Concern  . Not on file   Social History Narrative   Health Care POA:    Emergency Contact: wife, [name] [phone]   End of Life Plan: Pt reports having a end of life plan and will bring copy for next visit   Who lives with you: wife, [name]   Any pets: none   Diet: Pt has a varied diet of protein, starch, vegetables   Exercise: Pt  reports playing tennis and completing yard work at least 5 days per week   Seatbelts: Pt reports wearing seatbelt when in vehicles.    Sun Exposure/Protection: Pt reports wearing ball cap but no sun block   Hobbies: tennis, biking, volunteering, yard work     Family History  Problem Relation Age of Onset  . Alzheimer's disease Mother   . Arthritis Mother   . Arthritis Father   . Cancer Sister     breast  . Cancer Brother     melanoma  . Stroke Paternal Grandfather     BP 133/77  Pulse 48  Ht 6\' 4"  (1.93 m)  Wt 200 lb (90.719 kg)  BMI 24.35 kg/m2  Review of Systems: See HPI above.    Objective:  Physical Exam:  Gen: NAD  Left foot/ankle: Swelling and bruising of 5th digit.  No malrotation or angulation. TTP proximal 5th digit. NV intact distally.    Assessment & Plan:  1. Left 5th toe proximal phalanx fracture - identified by ultrasound.  Did not perform x-rays today - would not change management.  Buddy taping for up to 6 weeks.  Icing,  elevation.  Activities as tolerated.  F/u in 1 month if needed.

## 2013-08-10 NOTE — Assessment & Plan Note (Signed)
Left 5th toe proximal phalanx fracture - identified by ultrasound.  Did not perform x-rays today - would not change management.  Buddy taping for up to 6 weeks.  Icing, elevation.  Activities as tolerated.  F/u in 1 month if needed.

## 2013-08-28 ENCOUNTER — Telehealth: Payer: Self-pay | Admitting: Family Medicine

## 2013-08-28 ENCOUNTER — Other Ambulatory Visit: Payer: Self-pay | Admitting: *Deleted

## 2013-08-28 ENCOUNTER — Ambulatory Visit (HOSPITAL_COMMUNITY)
Admission: RE | Admit: 2013-08-28 | Discharge: 2013-08-28 | Disposition: A | Discharge status: Home / Self Care | Payer: Medicare Other | Source: Ambulatory Visit | Attending: Family Medicine | Admitting: Family Medicine

## 2013-08-28 ENCOUNTER — Ambulatory Visit (INDEPENDENT_AMBULATORY_CARE_PROVIDER_SITE_OTHER): Payer: Medicare Other | Admitting: Family Medicine

## 2013-08-28 ENCOUNTER — Encounter: Payer: Self-pay | Admitting: Family Medicine

## 2013-08-28 VITALS — BP 146/69 | HR 52 | Wt 204.6 lb

## 2013-08-28 DIAGNOSIS — I824Z9 Acute embolism and thrombosis of unspecified deep veins of unspecified distal lower extremity: Secondary | ICD-10-CM | POA: Insufficient documentation

## 2013-08-28 DIAGNOSIS — I80299 Phlebitis and thrombophlebitis of other deep vessels of unspecified lower extremity: Secondary | ICD-10-CM

## 2013-08-28 DIAGNOSIS — M7989 Other specified soft tissue disorders: Secondary | ICD-10-CM

## 2013-08-28 DIAGNOSIS — I824Y9 Acute embolism and thrombosis of unspecified deep veins of unspecified proximal lower extremity: Secondary | ICD-10-CM | POA: Insufficient documentation

## 2013-08-28 DIAGNOSIS — M79609 Pain in unspecified limb: Secondary | ICD-10-CM

## 2013-08-28 MED ORDER — CLONAZEPAM 1 MG PO TABS: 1.0000 mg | ORAL_TABLET | Freq: Every day | ORAL | Status: DC

## 2013-08-28 MED ORDER — RIVAROXABAN 15 MG PO TABS: 15.0000 mg | ORAL_TABLET | Freq: Two times a day (BID) | ORAL | Status: DC

## 2013-08-28 NOTE — Assessment & Plan Note (Signed)
New swelling and now off anticoagulants raises concern for new DVT

## 2013-08-28 NOTE — Progress Notes (Signed)
   Subjective:    Patient ID: Ryan [name], male    DOB: December 13, 1940, 73 y.o.   MRN: [id number]  HPI Patient e mailed me this morning saying his Right leg had new swelling this weekend. (See telephone note.) Given his history of DVT and that he is now only on ASA for anticoag, I set him up for an urgent venous doppler.  He comes to the [facility] having completed the doppler and at my request after I learned the doppler was positive for new DVT in the Rt leg.  He has no SOB or Chest pain.  He has had no recent surgery or leg trauma.  He has not had any periods of prolonged inactivity.    He is able to go right to the pharmacy and pick up a prescription.  He has an elective colonoscopy planned in about 2 weeks as a 5 year follow up of polyps.    Review of Systems     Objective:   Physical ExamLungs clear Cardiac RRR without m or g Rt leg 2+ pitting edema.        Assessment & Plan:

## 2013-08-28 NOTE — Telephone Encounter (Signed)
Called in controled substance.

## 2013-08-28 NOTE — Telephone Encounter (Signed)
I had the following e mail conversation with Katheran James,     We need to test you to see if you have developed a blood clot in that leg.  That would mean back on anticoagulants.  It is potentially serious and we need to have facts to make good decisions.  My nurse will call you today to set up prompt venous dopplers of the right leg.  If you have not heard from her by 2PM, please call.  Woke up Saturday with significant swelling in right calf; some pain (dull ache). Spent much of week end in recliner with leg elevated. Seemed to help.  Not much improved this AM. Should I come in?  Am in [place] with Hospice patient this AM. Back by Noon-1. Cell phone or e-mail below.  [facility] [address] [place], [place] [address] [phone] (home) [phone] (cell) [name]

## 2013-08-28 NOTE — Progress Notes (Addendum)
VASCULAR LAB PRELIMINARY  PRELIMINARY  PRELIMINARY  PRELIMINARY  Bilateral lower extremity venous duplex completed.    Preliminary report:  Positive for acute deep vein thrombosis of the right distal common femoral, femoral, popliteal, and posterior tibial veins. There is evidence of a chronic deep vein thrombosis of the popliteal vein. There is no obvious evidence of superfical thrombosis bilaterally or a Baker's cyst,  Onetta Spainhower, RVS 08/28/2013, 3:46 PM

## 2013-08-28 NOTE — Patient Instructions (Signed)
You have a new blood clot in the right leg.  You will be on life long anticoagulation. The new medicine is 15 mg twice daily with meals for 21 days.  Then the dose changes to 20mg  once a day with meals. Get that prescription tonight.   Stop your daily aspirin Do not take meloxicam or ibuprofen or aleve (NSAIDs).  If you have pain, use tylenol.  If you need something stronger, call. Call Dr. Wynetta Emery, GI, and let know that you have a new blood clot and new blood thinner.  Likely you should postpone your colonoscopy for 3-6 months.

## 2013-08-28 NOTE — Telephone Encounter (Signed)
Spoke with patient and informed him that appointment has been made for 2:30pm today patient needs to arrive at 2:15pm. Patient agrees to this.

## 2013-08-28 NOTE — Assessment & Plan Note (Addendum)
Acute DVT in Rt leg.  2nd DVT.  Lifelong anticoag. Advise to delay colonoscopy for 3-6 months to allow clot to stabilize.

## 2013-08-29 ENCOUNTER — Encounter: Payer: Self-pay | Admitting: *Deleted

## 2013-08-29 NOTE — Progress Notes (Signed)
Received PA approval for Xarelto 15 mg via [facility].  Med approved for 08/28/2013 - 08/30/2014 under Medicare Part D benefit.  [place] informed.  PA confirmation number [id number]. Derl Barrow, RN

## 2013-08-29 NOTE — Progress Notes (Signed)
Prior Authorization received from [facility] for Xarelto 15 mg Tablet. PA form completed by provider and faxed to [facility] for review.   Derl Barrow, RN

## 2013-08-30 ENCOUNTER — Ambulatory Visit (INDEPENDENT_AMBULATORY_CARE_PROVIDER_SITE_OTHER): Payer: Medicare Other | Admitting: Podiatrist

## 2013-08-30 ENCOUNTER — Encounter: Payer: Self-pay | Admitting: Podiatrist

## 2013-08-30 VITALS — BP 154/82 | HR 64 | Resp 15 | Ht 76.0 in | Wt 200.0 lb

## 2013-08-30 DIAGNOSIS — M7741 Metatarsalgia, right foot: Secondary | ICD-10-CM

## 2013-08-30 DIAGNOSIS — M7750 Other enthesopathy of unspecified foot: Secondary | ICD-10-CM

## 2013-08-30 DIAGNOSIS — M7742 Metatarsalgia, left foot: Principal | ICD-10-CM

## 2013-08-30 NOTE — Patient Instructions (Signed)
You have metatarsalgia of your feet.  We talked about custom orthotics versus putting a metatarsal bar on the outside of your shoes to help this condition--- call if you are interested in either.  Also, [employer] is the best shoe store around for athletic shoes-- go there to find some shoes for your trip!

## 2013-08-30 NOTE — Progress Notes (Signed)
   Subjective:    Patient ID: Ryan [name], male    DOB: November 25, 1940, 73 y.o.   MRN: [id number]  HPI Comments: Pt states at last visit he really didn't know what he was suppose to do.  Pt states at checkout he was given 1. Paper that he later understood to be a prescription for metatarsal bars, and 2. A paper explaining the cost of orthotics.  Pt states his feet do not hurt and he wanted to know what was next in his treatment.    Foot Pain      Review of Systems  Cardiovascular:       Pt is currently being treated for a right leg DVT.  All other systems reviewed and are negative.      Objective:   Physical Exam  Patient is awake, alert, and oriented x 3.  In no acute distress.  Vascular status is intact with palpable pedal pulses at 2/4 DP and PT bilateral and capillary refill time within normal limits. Neurological sensation is also intact bilaterally via Semmes Weinstein monofilament at 5/5 sites. Light touch, vibratory sensation, Achilles tendon reflex is intact. Dermatological exam reveals skin color, turger and texture as normal. No open lesions present.  Musculature intact with dorsiflexion, plantarflexion, inversion, eversion.  Plantarflexion contracture the forefoot bilaterally is noted consistent with metatarsalgia. This is the area of discomfort that he related the last visit. Today he states it's improved. Contraction of lesser digits is also noted bilateral.     Assessment & Plan:  Metatarsalgia bilateral Plan: The patient essentially came in today for clarification from his instructions from the last visit. I have discussed in ready prescription for metatarsal bars and after he went to the shoe repair shop he then understood with the metatarsal bars were for. We also discussed the padding that I dispensed and that he was supposed to try it in his inserts to take the pressure off of the forefoot he then stated that he did try that and it hurt. I clarified to him that custom  orthotics may be beneficial but I wanted to make sure the forefoot padding was something he can tolerate and apparently he cannot. At this point no treatment will be rendered and he'll be seen back as needed for followup. I tried my best to clarify and right out specific from today's visit.

## 2013-09-05 ENCOUNTER — Other Ambulatory Visit: Payer: Self-pay | Admitting: Family Medicine

## 2013-09-05 DIAGNOSIS — I80299 Phlebitis and thrombophlebitis of other deep vessels of unspecified lower extremity: Secondary | ICD-10-CM

## 2013-09-05 MED ORDER — RIVAROXABAN 20 MG PO TABS: 20.0000 mg | ORAL_TABLET | Freq: Every day | ORAL | Status: DC

## 2013-09-05 NOTE — Assessment & Plan Note (Signed)
Patient knows he needs three weeks of 15 bid xarelto.  Because of the prior authorization process and a trip out of town that he plans, will send in Rx for xarelto 20 mg now.

## 2013-11-14 ENCOUNTER — Ambulatory Visit (INDEPENDENT_AMBULATORY_CARE_PROVIDER_SITE_OTHER): Payer: Medicare Other | Admitting: *Deleted

## 2013-11-14 DIAGNOSIS — Z23 Encounter for immunization: Secondary | ICD-10-CM

## 2013-11-15 ENCOUNTER — Ambulatory Visit: Payer: Medicare Other

## 2013-11-17 ENCOUNTER — Ambulatory Visit: Payer: Medicare Other

## 2014-01-03 ENCOUNTER — Other Ambulatory Visit: Payer: Self-pay | Admitting: Family Medicine

## 2014-02-19 ENCOUNTER — Other Ambulatory Visit: Payer: Self-pay | Admitting: *Deleted

## 2014-02-19 MED ORDER — CLONAZEPAM 1 MG PO TABS: 1.0000 mg | ORAL_TABLET | Freq: Every day | ORAL | Status: DC

## 2014-02-19 NOTE — Telephone Encounter (Signed)
Refilled via fax request. 

## 2014-05-17 ENCOUNTER — Other Ambulatory Visit: Payer: Self-pay | Admitting: Family Medicine

## 2014-05-17 MED ORDER — OSELTAMIVIR PHOSPHATE 75 MG PO CAPS: 75.0000 mg | ORAL_CAPSULE | Freq: Two times a day (BID) | ORAL | Status: DC

## 2014-05-17 NOTE — Progress Notes (Signed)
Shelda Altes is not at the [facility].  Dorcas Mcmurray is covering.  Ryan [name], Ryan [name] is my patient.  Can you send him an Rx for Tamiflu?  Thanks. Ryan [name] from my iPad  On May 16, 2014, at 11:00 PM, Ryan [name]> wrote: [name] is away and the flu issue in the message below is a bit time sensitive.  Could you advise me on that point? I can wait for Bill on the rest of it.  Many thanks.  Katheran James ---------- Forwarded message ---------- From: [name]> Date: Wed, May 16, 2014 at 10:54 PM Subject: Flu exposure To: "Hensel, Bill" [email or web address]>  Visited my daughter last Saturday in [place].  Sunday morning she got up, was not feeling well.  I packed up and returned to [facility] per original plan.  She called to day to say that she had been seen by her physician and he had confirmed the flu and prescribed Tamaflu, which she got.  She also suggested I get the same since I was clearly exposed and given the contacts, period of time, etc I might well have the bug as well.  Your thoughts please.

## 2014-06-16 ENCOUNTER — Other Ambulatory Visit: Payer: Self-pay | Admitting: Family Medicine

## 2014-06-16 DIAGNOSIS — Z86718 Personal history of other venous thrombosis and embolism: Secondary | ICD-10-CM

## 2014-06-16 DIAGNOSIS — R739 Hyperglycemia, unspecified: Secondary | ICD-10-CM

## 2014-06-16 DIAGNOSIS — E7800 Pure hypercholesterolemia, unspecified: Secondary | ICD-10-CM

## 2014-06-16 NOTE — Assessment & Plan Note (Signed)
lab

## 2014-06-17 DIAGNOSIS — N390 Urinary tract infection, site not specified: Secondary | ICD-10-CM | POA: Diagnosis not present

## 2014-06-17 DIAGNOSIS — N419 Inflammatory disease of prostate, unspecified: Secondary | ICD-10-CM | POA: Diagnosis not present

## 2014-07-10 ENCOUNTER — Other Ambulatory Visit (INDEPENDENT_AMBULATORY_CARE_PROVIDER_SITE_OTHER): Payer: Medicare Other

## 2014-07-10 DIAGNOSIS — Z86718 Personal history of other venous thrombosis and embolism: Secondary | ICD-10-CM | POA: Diagnosis not present

## 2014-07-10 DIAGNOSIS — R739 Hyperglycemia, unspecified: Secondary | ICD-10-CM

## 2014-07-10 DIAGNOSIS — E7800 Pure hypercholesterolemia, unspecified: Secondary | ICD-10-CM

## 2014-07-10 LAB — COMPREHENSIVE METABOLIC PANEL
ALK PHOS: 54 U/L (ref 39–117)
ALT: 18 U/L (ref 0–53)
AST: 20 U/L (ref 0–37)
Albumin: 4.1 g/dL (ref 3.5–5.2)
BILIRUBIN TOTAL: 1 mg/dL (ref 0.2–1.2)
BUN: 28 mg/dL — AB (ref 6–23)
CO2: 26 mEq/L (ref 19–32)
CREATININE: 1.1 mg/dL (ref 0.50–1.35)
Calcium: 9.3 mg/dL (ref 8.4–10.5)
Chloride: 103 mEq/L (ref 96–112)
Glucose, Bld: 100 mg/dL — ABNORMAL HIGH (ref 70–99)
Potassium: 4.6 mEq/L (ref 3.5–5.3)
Sodium: 141 mEq/L (ref 135–145)
Total Protein: 6.5 g/dL (ref 6.0–8.3)

## 2014-07-10 LAB — CBC
HEMATOCRIT: 48.8 % (ref 39.0–52.0)
Hemoglobin: 16.6 g/dL (ref 13.0–17.0)
MCH: 32.4 pg (ref 26.0–34.0)
MCHC: 34 g/dL (ref 30.0–36.0)
MCV: 95.3 fL (ref 78.0–100.0)
MPV: 10.9 fL (ref 8.6–12.4)
Platelets: 215 10*3/uL (ref 150–400)
RBC: 5.12 MIL/uL (ref 4.22–5.81)
RDW: 13 % (ref 11.5–15.5)
WBC: 5.2 10*3/uL (ref 4.0–10.5)

## 2014-07-10 LAB — LIPID PANEL
CHOL/HDL RATIO: 2.7 ratio
Cholesterol: 169 mg/dL (ref 0–200)
HDL: 63 mg/dL (ref >=40)
LDL Cholesterol: 93 mg/dL (ref 0–99)
Triglycerides: 65 mg/dL (ref <150)
VLDL: 13 mg/dL (ref 0–40)

## 2014-07-10 LAB — POCT GLYCOSYLATED HEMOGLOBIN (HGB A1C): HEMOGLOBIN A1C: 5.3

## 2014-07-10 NOTE — Progress Notes (Signed)
CMP,CBC,FLP AND A1C DONE TODAY Ryan [name]

## 2014-07-11 ENCOUNTER — Encounter: Payer: Self-pay | Admitting: Family Medicine

## 2014-07-20 ENCOUNTER — Ambulatory Visit (INDEPENDENT_AMBULATORY_CARE_PROVIDER_SITE_OTHER): Payer: Medicare Other | Admitting: Family Medicine

## 2014-07-20 ENCOUNTER — Encounter: Payer: Self-pay | Admitting: Family Medicine

## 2014-07-20 VITALS — BP 138/82 | HR 49 | Temp 97.6°F | Ht 76.0 in | Wt 204.8 lb

## 2014-07-20 DIAGNOSIS — E7800 Pure hypercholesterolemia, unspecified: Secondary | ICD-10-CM

## 2014-07-20 DIAGNOSIS — Z0000 Encounter for general adult medical examination without abnormal findings: Secondary | ICD-10-CM | POA: Diagnosis not present

## 2014-07-20 DIAGNOSIS — D126 Benign neoplasm of colon, unspecified: Secondary | ICD-10-CM

## 2014-07-20 DIAGNOSIS — R739 Hyperglycemia, unspecified: Secondary | ICD-10-CM

## 2014-07-20 DIAGNOSIS — R001 Bradycardia, unspecified: Secondary | ICD-10-CM

## 2014-07-20 DIAGNOSIS — Z86718 Personal history of other venous thrombosis and embolism: Secondary | ICD-10-CM

## 2014-07-20 DIAGNOSIS — N400 Enlarged prostate without lower urinary tract symptoms: Secondary | ICD-10-CM

## 2014-07-20 NOTE — Assessment & Plan Note (Signed)
UTI x 2 may be a warning.  Encouraged FU with uro.

## 2014-07-20 NOTE — Progress Notes (Signed)
   Subjective:    Patient ID: Ryan [name], male    DOB: 1940-10-05, 74 y.o.   MRN: [id number]  HPI  Annual wellness exam.   Up to date on HPDP Main complaints are musculoskeletal.  Sees ortho.  From a medical standpoint, he is on atorvastatin and did have myalgias on simvastatin.   Also has had 2 UTIs in the past year.  Seems to have complete bladder emptying.  Stream is not as good as a year ago.  Has not seen uro in over 2 years. Plans to get colonoscopy - Told to hold Xaralto 24hour before procedure. For bradycardia, no syncope or lightheadedness.     Review of Systems 12 point ROS neg.     Objective:   Physical ExamHEENT normal Neck without mass Cardiac BP borderline high.  Bradycardic.  EKG shows sinus brady Lungs clear Abd benign.         Assessment & Plan:

## 2014-07-20 NOTE — Assessment & Plan Note (Addendum)
Brady at rest, check EKG EKG reassuring.

## 2014-07-20 NOTE — Assessment & Plan Note (Signed)
Because he is such a healthy 74 yo, encouraged his follow up colonoscopy.

## 2014-07-20 NOTE — Assessment & Plan Note (Signed)
Very healthy male without any concerning symptoms.

## 2014-07-20 NOTE — Patient Instructions (Addendum)
Yes, please get your colonoscopy.  Likely you will need to hold your Xarelto for 24 hours prior to the procedure.   Please check you blood pressure at home periodically.  The goal numbers are less than 140/90. Think about a one month trial off the atorvastatin to see if that affect the joint/muscle soreness.  If you do, let me know the results. Go ahead and see Dr. Era Bumpers at your convenience.

## 2014-07-20 NOTE — Assessment & Plan Note (Signed)
Cholesterol fine on statin, but does have myaglias, unclear whether caused by statin.  Discussed a 30 day trial off atorvastatin.

## 2014-07-20 NOTE — Assessment & Plan Note (Signed)
No bleeding.  Continue life long anticoag.

## 2014-07-20 NOTE — Assessment & Plan Note (Signed)
Not diabetic.  A1C is fine.

## 2014-08-14 ENCOUNTER — Other Ambulatory Visit: Payer: Self-pay | Admitting: *Deleted

## 2014-08-14 MED ORDER — CLONAZEPAM 1 MG PO TABS: 1.0000 mg | ORAL_TABLET | Freq: Every day | ORAL | Status: DC

## 2014-08-15 DIAGNOSIS — M1712 Unilateral primary osteoarthritis, left knee: Secondary | ICD-10-CM | POA: Diagnosis not present

## 2014-08-15 DIAGNOSIS — M170 Bilateral primary osteoarthritis of knee: Secondary | ICD-10-CM | POA: Diagnosis not present

## 2014-08-15 DIAGNOSIS — M1711 Unilateral primary osteoarthritis, right knee: Secondary | ICD-10-CM | POA: Diagnosis not present

## 2014-08-22 DIAGNOSIS — M1711 Unilateral primary osteoarthritis, right knee: Secondary | ICD-10-CM | POA: Diagnosis not present

## 2014-08-22 DIAGNOSIS — M1712 Unilateral primary osteoarthritis, left knee: Secondary | ICD-10-CM | POA: Diagnosis not present

## 2014-08-22 DIAGNOSIS — M170 Bilateral primary osteoarthritis of knee: Secondary | ICD-10-CM | POA: Diagnosis not present

## 2014-08-29 DIAGNOSIS — M170 Bilateral primary osteoarthritis of knee: Secondary | ICD-10-CM | POA: Diagnosis not present

## 2014-08-29 DIAGNOSIS — M1712 Unilateral primary osteoarthritis, left knee: Secondary | ICD-10-CM | POA: Diagnosis not present

## 2014-08-29 DIAGNOSIS — M1711 Unilateral primary osteoarthritis, right knee: Secondary | ICD-10-CM | POA: Diagnosis not present

## 2014-09-10 ENCOUNTER — Other Ambulatory Visit: Payer: Self-pay | Admitting: Family Medicine

## 2014-10-01 DIAGNOSIS — H02403 Unspecified ptosis of bilateral eyelids: Secondary | ICD-10-CM | POA: Diagnosis not present

## 2014-10-01 DIAGNOSIS — H2512 Age-related nuclear cataract, left eye: Secondary | ICD-10-CM | POA: Diagnosis not present

## 2014-10-01 DIAGNOSIS — Z961 Presence of intraocular lens: Secondary | ICD-10-CM | POA: Diagnosis not present

## 2014-10-17 ENCOUNTER — Other Ambulatory Visit: Payer: Self-pay | Admitting: Gastroenterology

## 2014-10-18 ENCOUNTER — Encounter (HOSPITAL_COMMUNITY): Payer: Self-pay | Admitting: *Deleted

## 2014-10-30 ENCOUNTER — Encounter (HOSPITAL_COMMUNITY): Payer: Self-pay | Admitting: Certified Registered Nurse Anesthetist

## 2014-10-30 ENCOUNTER — Ambulatory Visit (HOSPITAL_COMMUNITY): Payer: Medicare Other | Admitting: Certified Registered Nurse Anesthetist

## 2014-10-30 ENCOUNTER — Ambulatory Visit (HOSPITAL_COMMUNITY)
Admission: RE | Admit: 2014-10-30 | Discharge: 2014-10-30 | Disposition: A | Discharge status: Home / Self Care | Payer: Medicare Other | Source: Ambulatory Visit | Attending: Gastroenterology | Admitting: Gastroenterology

## 2014-10-30 ENCOUNTER — Encounter (HOSPITAL_COMMUNITY)
Admission: RE | Disposition: A | Discharge status: Home / Self Care | Payer: Self-pay | Source: Ambulatory Visit | Attending: Gastroenterology

## 2014-10-30 DIAGNOSIS — Z86010 Personal history of colonic polyps: Secondary | ICD-10-CM | POA: Insufficient documentation

## 2014-10-30 DIAGNOSIS — K5730 Diverticulosis of large intestine without perforation or abscess without bleeding: Secondary | ICD-10-CM | POA: Diagnosis not present

## 2014-10-30 DIAGNOSIS — Z86718 Personal history of other venous thrombosis and embolism: Secondary | ICD-10-CM | POA: Insufficient documentation

## 2014-10-30 DIAGNOSIS — Z87891 Personal history of nicotine dependence: Secondary | ICD-10-CM | POA: Insufficient documentation

## 2014-10-30 DIAGNOSIS — R001 Bradycardia, unspecified: Secondary | ICD-10-CM | POA: Diagnosis not present

## 2014-10-30 DIAGNOSIS — Z7901 Long term (current) use of anticoagulants: Secondary | ICD-10-CM | POA: Diagnosis not present

## 2014-10-30 DIAGNOSIS — M479 Spondylosis, unspecified: Secondary | ICD-10-CM | POA: Diagnosis not present

## 2014-10-30 DIAGNOSIS — E780 Pure hypercholesterolemia: Secondary | ICD-10-CM | POA: Diagnosis not present

## 2014-10-30 DIAGNOSIS — K5790 Diverticulosis of intestine, part unspecified, without perforation or abscess without bleeding: Secondary | ICD-10-CM | POA: Diagnosis not present

## 2014-10-30 DIAGNOSIS — Z1211 Encounter for screening for malignant neoplasm of colon: Secondary | ICD-10-CM | POA: Insufficient documentation

## 2014-10-30 HISTORY — DX: Acute embolism and thrombosis of unspecified deep veins of unspecified lower extremity: I82.409

## 2014-10-30 HISTORY — PX: COLONOSCOPY WITH PROPOFOL: SHX5780

## 2014-10-30 SURGERY — COLONOSCOPY WITH PROPOFOL
Anesthesia: Monitor Anesthesia Care

## 2014-10-30 MED ORDER — SODIUM CHLORIDE 0.9 % IV SOLN: INTRAVENOUS | Status: DC

## 2014-10-30 MED ORDER — GLYCOPYRROLATE 0.2 MG/ML IJ SOLN
INTRAMUSCULAR | Status: DC | PRN
  Administered 2014-10-30 (×2): 0.1 mg via INTRAVENOUS

## 2014-10-30 MED ORDER — LACTATED RINGERS IV SOLN
INTRAVENOUS | Status: DC
  Administered 2014-10-30: 1000 mL via INTRAVENOUS

## 2014-10-30 MED ORDER — PROPOFOL 10 MG/ML IV BOLUS
INTRAVENOUS | Status: AC
  Filled 2014-10-30: qty 20

## 2014-10-30 MED ORDER — PROPOFOL 10 MG/ML IV BOLUS
INTRAVENOUS | Status: DC | PRN
  Administered 2014-10-30: 20 mg via INTRAVENOUS
  Administered 2014-10-30 (×5): 40 mg via INTRAVENOUS

## 2014-10-30 MED ORDER — PROPOFOL 10 MG/ML IV BOLUS
INTRAVENOUS | Status: AC
Start: 2014-10-30 — End: 2014-10-30
  Filled 2014-10-30: qty 20

## 2014-10-30 SURGICAL SUPPLY — 21 items

## 2014-10-30 NOTE — H&P (Signed)
  Procedure: Surveillance colonoscopy. 03/30/2008 normal surveillance colonoscopy performed. In 2005, a 3 mm tubular adenomatous sigmoid colon polyp was removed colonoscopically.  History: The patient is a 74 year old male born [birth year]. He is scheduled to undergo a surveillance colonoscopy today. He chronically takes Xarelto to prevent recurrent deep venous thrombosis. He stopped taking Xarelto 4 days ago.  Past medical history: Hernia repair. Appendectomy. Retinal detachment repair. Hypercholesterolemia. Restless leg syndrome. Low back pain. Axial degenerative joint disease. Thrombophlebitis.  Exam: The patient is alert and lying comfortably on the endoscopy stretcher. Abdomen is soft and nontender to palpation. Lungs are clear to auscultation. Cardiac exam reveals a regular rhythm.  Plan: Proceed with surveillance colonoscopy

## 2014-10-30 NOTE — Anesthesia Postprocedure Evaluation (Signed)
  Anesthesia Post-op Note  Patient: Ryan [name]  Procedure(s) Performed: Procedure(s) (LRB): COLONOSCOPY WITH PROPOFOL (N/A)  Patient Location: PACU  Anesthesia Type: MAC  Level of Consciousness: awake and alert   Airway and Oxygen Therapy: Patient Spontanous Breathing  Post-op Pain: mild  Post-op Assessment: Post-op Vital signs reviewed, Patient's Cardiovascular Status Stable, Respiratory Function Stable, Patent Airway and No signs of Nausea or vomiting  Last Vitals:  Filed Vitals:   10/30/14 0940  BP: 117/84  Pulse: 55  Temp:   Resp: 21    Post-op Vital Signs: stable   Complications: No apparent anesthesia complications

## 2014-10-30 NOTE — Discharge Instructions (Signed)

## 2014-10-30 NOTE — Transfer of Care (Signed)
Immediate Anesthesia Transfer of Care Note  Patient: Ryan [name]  Procedure(s) Performed: Procedure(s): COLONOSCOPY WITH PROPOFOL (N/A)  Patient Location: PACU and Endoscopy Unit  Anesthesia Type:MAC  Level of Consciousness: awake, oriented, patient cooperative, lethargic and responds to stimulation  Airway & Oxygen Therapy: Patient Spontanous Breathing and Patient connected to face mask oxygen  Post-op Assessment: Report given to RN, Post -op Vital signs reviewed and stable and Patient moving all extremities  Post vital signs: Reviewed and stable  Last Vitals:  Filed Vitals:   10/30/14 0759  BP: 155/81  Temp: 36.4 C  Resp: 13    Complications: No apparent anesthesia complications

## 2014-10-30 NOTE — Op Note (Signed)
Procedure: Surveillance colonoscopy. 03/30/2008 normal surveillance colonoscopy performed. In 2005, a 3 mm tubular adenomatous sigmoid colon polyp was removed colonoscopically  Endoscopist: Earle Gell  Premedication: Propofol administered by anesthesia  Procedure: Patient was placed in the left lateral decubitus position. Anal inspection and digital rectal exam were normal. The Pentax pediatric colonoscope was introduced into the rectum and advanced to the cecum. A normal-appearing ileocecal valve and appendiceal orifice were identified. Colonic preparation for the exam today was good. Withdrawal time was 13 minutes.  Rectum. Normal. Retroflexed view of the distal rectum was normal  Sigmoid colon and descending colon. Left colonic diverticulosis  Splenic flexure. Normal  Transverse colon. Normal.  Hepatic flexure. Normal  Ascending colon. Normal  Cecum and ileocecal valve. Normal  Assessment: Normal surveillance colonoscopy.

## 2014-10-30 NOTE — Anesthesia Preprocedure Evaluation (Addendum)
Anesthesia Evaluation  Patient identified by MRN, date of birth, ID band Patient awake    Reviewed: Allergy & Precautions, H&P , NPO status , Patient's Chart, lab work & pertinent test results  Airway Mallampati: II  TM Distance: >3 FB Neck ROM: full    Dental no notable dental hx. (+) Dental Advisory Given, Teeth Intact   Pulmonary neg pulmonary ROS, former smoker,    Pulmonary exam normal breath sounds clear to auscultation       Cardiovascular Exercise Tolerance: Good negative cardio ROS Normal cardiovascular exam Rhythm:regular Rate:Normal  bradycardia   Neuro/Psych negative neurological ROS  negative psych ROS   GI/Hepatic negative GI ROS, Neg liver ROS,   Endo/Other  negative endocrine ROShyperglycemia  Renal/GU negative Renal ROS  negative genitourinary   Musculoskeletal   Abdominal   Peds  Hematology negative hematology ROS (+)   Anesthesia Other Findings   Reproductive/Obstetrics negative OB ROS                             Anesthesia Physical Anesthesia Plan  ASA: II  Anesthesia Plan: MAC   Post-op Pain Management:    Induction:   Airway Management Planned:   Additional Equipment:   Intra-op Plan:   Post-operative Plan:   Informed Consent: I have reviewed the patients History and Physical, chart, labs and discussed the procedure including the risks, benefits and alternatives for the proposed anesthesia with the patient or authorized representative who has indicated his/her understanding and acceptance.   Dental Advisory Given  Plan Discussed with: CRNA and Surgeon  Anesthesia Plan Comments:         Anesthesia Quick Evaluation

## 2014-10-31 ENCOUNTER — Ambulatory Visit
Admission: RE | Admit: 2014-10-31 | Discharge: 2014-10-31 | Disposition: A | Discharge status: Home / Self Care | Payer: Medicare Other | Source: Ambulatory Visit | Attending: Gastroenterology | Admitting: Gastroenterology

## 2014-10-31 ENCOUNTER — Other Ambulatory Visit: Payer: Self-pay | Admitting: Gastroenterology

## 2014-10-31 DIAGNOSIS — G441 Vascular headache, not elsewhere classified: Secondary | ICD-10-CM

## 2014-10-31 DIAGNOSIS — R51 Headache: Secondary | ICD-10-CM | POA: Diagnosis not present

## 2014-11-08 ENCOUNTER — Telehealth: Payer: Self-pay | Admitting: Family Medicine

## 2014-11-08 MED ORDER — TRAMADOL HCL 50 MG PO TABS: 50.0000 mg | ORAL_TABLET | Freq: Three times a day (TID) | ORAL | Status: DC | PRN

## 2014-11-08 NOTE — Telephone Encounter (Signed)
Patient has sent me two emails about headaches.  E mail of 9/25 talked about headache, left sided that started after colonoscopy. I called and discussed. GI doc ordered a head CT which was normal.  Clearly was not the worst headache of his life.  We came to the conclusion that it was slowly improving and sounded mostly like cluster headache although he had no lacrimation and no previous hx of cluster.  He e mailed again today and slow improvement continues.  Now has headache worsened by cough or sneeze.  He is on blood thinner.  Will provide Rx of tramadol and ask to seem me.  I have a small concern of a CNS bleed not seen on head CT mostly due to him being anticoagulated.  Because of continued slow improvement, I will see him routinely, not urgently.  He knows to contact me if worsening.

## 2014-11-14 ENCOUNTER — Ambulatory Visit (INDEPENDENT_AMBULATORY_CARE_PROVIDER_SITE_OTHER): Payer: Medicare Other | Admitting: Family Medicine

## 2014-11-14 VITALS — BP 126/90 | HR 63 | Temp 97.7°F | Ht 76.0 in | Wt 199.9 lb

## 2014-11-14 DIAGNOSIS — R51 Headache: Secondary | ICD-10-CM

## 2014-11-14 DIAGNOSIS — R739 Hyperglycemia, unspecified: Secondary | ICD-10-CM

## 2014-11-14 DIAGNOSIS — Z23 Encounter for immunization: Secondary | ICD-10-CM

## 2014-11-14 DIAGNOSIS — F29 Unspecified psychosis not due to a substance or known physiological condition: Secondary | ICD-10-CM

## 2014-11-14 DIAGNOSIS — F09 Unspecified mental disorder due to known physiological condition: Secondary | ICD-10-CM | POA: Diagnosis not present

## 2014-11-14 DIAGNOSIS — G4452 New daily persistent headache (NDPH): Secondary | ICD-10-CM | POA: Diagnosis not present

## 2014-11-14 DIAGNOSIS — R519 Headache, unspecified: Secondary | ICD-10-CM | POA: Insufficient documentation

## 2014-11-14 LAB — COMPREHENSIVE METABOLIC PANEL
ALK PHOS: 64 U/L (ref 40–115)
ALT: 13 U/L (ref 9–46)
AST: 14 U/L (ref 10–35)
Albumin: 4.5 g/dL (ref 3.6–5.1)
BUN: 19 mg/dL (ref 7–25)
CALCIUM: 9.3 mg/dL (ref 8.6–10.3)
CO2: 27 mmol/L (ref 20–31)
Chloride: 101 mmol/L (ref 98–110)
Creat: 1.28 mg/dL — ABNORMAL HIGH (ref 0.70–1.18)
Glucose, Bld: 92 mg/dL (ref 65–99)
POTASSIUM: 4.4 mmol/L (ref 3.5–5.3)
Sodium: 138 mmol/L (ref 135–146)
TOTAL PROTEIN: 7 g/dL (ref 6.1–8.1)
Total Bilirubin: 0.7 mg/dL (ref 0.2–1.2)

## 2014-11-14 LAB — CBC
HEMATOCRIT: 48.5 % (ref 39.0–52.0)
HEMOGLOBIN: 16.8 g/dL (ref 13.0–17.0)
MCH: 32.6 pg (ref 26.0–34.0)
MCHC: 34.6 g/dL (ref 30.0–36.0)
MCV: 94 fL (ref 78.0–100.0)
MPV: 10.3 fL (ref 8.6–12.4)
Platelets: 228 10*3/uL (ref 150–400)
RBC: 5.16 MIL/uL (ref 4.22–5.81)
RDW: 13.2 % (ref 11.5–15.5)
WBC: 7.4 10*3/uL (ref 4.0–10.5)

## 2014-11-14 LAB — TSH: TSH: 3.121 u[IU]/mL (ref 0.350–4.500)

## 2014-11-14 MED ORDER — PREDNISONE 20 MG PO TABS: 20.0000 mg | ORAL_TABLET | Freq: Three times a day (TID) | ORAL | Status: DC

## 2014-11-14 NOTE — Patient Instructions (Signed)
I don't know for sure.  I will keep doing tests to I find out. Google Temporal Arteritis also known as Giant Cell Arteritis is my best guess right now.  Google to learn more.  That is why I am starting the steroids today.   I will call tomorrow with blood test results.  If I do not get answers, next step will be a brain MRI.

## 2014-11-14 NOTE — Assessment & Plan Note (Addendum)
Long differential diagnosis.  Urgently worried about temporal arteritis.  Start steroids pending work up. Steroids stopped on 10/6 since sed rate=23 making temporal arteritis highly unlikely.   I am worried about a CVA not seen on original CT.  Also worried about CNS or subarachnoid bleed given he is on xarelto.  Discussed with radiology.  Will proceed with MRI of brain, no contrast. I am concerned.  Ryan [name] is not a complainer and he and his family are concerned.

## 2014-11-15 ENCOUNTER — Encounter: Payer: Self-pay | Admitting: Family Medicine

## 2014-11-15 LAB — SEDIMENTATION RATE: SED RATE: 23 mm/h — AB (ref 0–20)

## 2014-11-15 NOTE — Progress Notes (Signed)
   Subjective:    Patient ID: Ryan [name], male    DOB: 09-21-1940, 74 y.o.   MRN: [id number]  HPI This visit is the continuation of a two week conversation that started with an e mail.  Patient was feeling well until colonoscopy 2 weeks ago.  He emerged from the colonoscopy with a significant headache.  Not the worst headache of his life but close.  He told this to his GI doc with thought it was nothing and ordered a head CT which was normal.  The headache is slightly less but continues.  Left sided predominent.  Worse with cough or sneeze.  He also has low back pain/severe that started around the same time as the headache.  There was no trauma.  He has not had migraine headaches before.  No lacrimation or rhinnorhea with headache.    General health has been fine.  No memory problems.  No fever or chills.  No focal neuro deficits.  He does feel mentally slower than before the colonoscopy. No jaw claudication.  I should also note that I have received unsolicited e mails from family saying Ryan [name] is not the same since the colonoscopy and that they are worried about him.    Review of Systems     Objective:   Physical Exam HEENT. Mild tenderness to palpation over left temporal artery/temporalis muscle.   Neck supple - no meningismus. Lungs clear Cardiac RRR without m or g Abd benign Neuro CN2-12 intact.  Motor and sensory grossly intact.  Dtrs 2+ and symetric.  Gait normal.  Mentation normal        Assessment & Plan:

## 2014-11-21 DIAGNOSIS — F29 Unspecified psychosis not due to a substance or known physiological condition: Secondary | ICD-10-CM | POA: Insufficient documentation

## 2014-11-21 NOTE — Assessment & Plan Note (Signed)
Because not as sharp mentally, feel TSH is needed.

## 2014-11-23 ENCOUNTER — Telehealth: Payer: Self-pay | Admitting: Family Medicine

## 2014-11-23 NOTE — Telephone Encounter (Signed)
Patient left message on the medical records line that he needed to speak to someone concerning his MRI that is scheduled.

## 2014-11-23 NOTE — Telephone Encounter (Signed)
LM for pt to call us back. Ottis Stain, CMA

## 2014-12-04 ENCOUNTER — Ambulatory Visit (HOSPITAL_COMMUNITY)
Admission: RE | Admit: 2014-12-04 | Discharge: 2014-12-04 | Disposition: A | Discharge status: Home / Self Care | Payer: Medicare Other | Source: Ambulatory Visit | Attending: Family Medicine | Admitting: Family Medicine

## 2014-12-04 DIAGNOSIS — I679 Cerebrovascular disease, unspecified: Secondary | ICD-10-CM | POA: Insufficient documentation

## 2014-12-04 DIAGNOSIS — G4452 New daily persistent headache (NDPH): Secondary | ICD-10-CM | POA: Insufficient documentation

## 2014-12-04 DIAGNOSIS — R51 Headache: Secondary | ICD-10-CM | POA: Diagnosis not present

## 2014-12-06 NOTE — Telephone Encounter (Signed)
Procedure was completed on 12/04/14. Ottis Stain, CMA

## 2014-12-13 ENCOUNTER — Other Ambulatory Visit: Payer: Self-pay | Admitting: Family Medicine

## 2015-01-09 DIAGNOSIS — M1711 Unilateral primary osteoarthritis, right knee: Secondary | ICD-10-CM | POA: Diagnosis not present

## 2015-01-09 DIAGNOSIS — M238X1 Other internal derangements of right knee: Secondary | ICD-10-CM | POA: Diagnosis not present

## 2015-01-18 DIAGNOSIS — M1711 Unilateral primary osteoarthritis, right knee: Secondary | ICD-10-CM | POA: Diagnosis not present

## 2015-01-23 ENCOUNTER — Telehealth: Payer: Self-pay | Admitting: Family Medicine

## 2015-01-23 DIAGNOSIS — M1711 Unilateral primary osteoarthritis, right knee: Secondary | ICD-10-CM | POA: Diagnosis not present

## 2015-01-23 DIAGNOSIS — M238X1 Other internal derangements of right knee: Secondary | ICD-10-CM | POA: Diagnosis not present

## 2015-01-23 NOTE — Telephone Encounter (Signed)
Patient dropped off form to be filled out for surgery clearance.  Please fax when completed.

## 2015-01-27 DIAGNOSIS — R05 Cough: Secondary | ICD-10-CM | POA: Diagnosis not present

## 2015-01-28 NOTE — Telephone Encounter (Signed)
Patient actually came by with another copy that I filled out while he was here.

## 2015-01-28 NOTE — Telephone Encounter (Signed)
Forms placed in PCP box. Zimmerman Rumple, Del Overfelt D, CMA  

## 2015-01-30 ENCOUNTER — Other Ambulatory Visit: Payer: Self-pay | Admitting: *Deleted

## 2015-01-30 MED ORDER — CLONAZEPAM 1 MG PO TABS: 1.0000 mg | ORAL_TABLET | Freq: Every evening | ORAL | Status: DC | PRN

## 2015-01-30 NOTE — Telephone Encounter (Signed)
Refilled with #30 no refills Faxed form Dorcas Mcmurray

## 2015-02-26 DIAGNOSIS — M94261 Chondromalacia, right knee: Secondary | ICD-10-CM | POA: Diagnosis not present

## 2015-02-26 DIAGNOSIS — G8918 Other acute postprocedural pain: Secondary | ICD-10-CM | POA: Diagnosis not present

## 2015-02-26 DIAGNOSIS — S83241A Other tear of medial meniscus, current injury, right knee, initial encounter: Secondary | ICD-10-CM | POA: Diagnosis not present

## 2015-02-26 DIAGNOSIS — M23351 Other meniscus derangements, posterior horn of lateral meniscus, right knee: Secondary | ICD-10-CM | POA: Diagnosis not present

## 2015-02-26 DIAGNOSIS — Y929 Unspecified place or not applicable: Secondary | ICD-10-CM | POA: Diagnosis not present

## 2015-02-26 DIAGNOSIS — S83281A Other tear of lateral meniscus, current injury, right knee, initial encounter: Secondary | ICD-10-CM | POA: Diagnosis not present

## 2015-02-26 DIAGNOSIS — Y939 Activity, unspecified: Secondary | ICD-10-CM | POA: Diagnosis not present

## 2015-02-26 DIAGNOSIS — M23321 Other meniscus derangements, posterior horn of medial meniscus, right knee: Secondary | ICD-10-CM | POA: Diagnosis not present

## 2015-02-26 DIAGNOSIS — Y999 Unspecified external cause status: Secondary | ICD-10-CM | POA: Diagnosis not present

## 2015-03-05 ENCOUNTER — Other Ambulatory Visit: Payer: Self-pay | Admitting: *Deleted

## 2015-03-05 DIAGNOSIS — M238X1 Other internal derangements of right knee: Secondary | ICD-10-CM | POA: Diagnosis not present

## 2015-03-05 MED ORDER — CLONAZEPAM 1 MG PO TABS: 1.0000 mg | ORAL_TABLET | Freq: Every evening | ORAL | Status: DC | PRN

## 2015-03-07 DIAGNOSIS — M238X1 Other internal derangements of right knee: Secondary | ICD-10-CM | POA: Diagnosis not present

## 2015-03-12 DIAGNOSIS — M238X1 Other internal derangements of right knee: Secondary | ICD-10-CM | POA: Diagnosis not present

## 2015-03-15 DIAGNOSIS — M238X1 Other internal derangements of right knee: Secondary | ICD-10-CM | POA: Diagnosis not present

## 2015-03-18 DIAGNOSIS — M238X1 Other internal derangements of right knee: Secondary | ICD-10-CM | POA: Diagnosis not present

## 2015-03-25 DIAGNOSIS — M7551 Bursitis of right shoulder: Secondary | ICD-10-CM | POA: Diagnosis not present

## 2015-03-26 DIAGNOSIS — M238X1 Other internal derangements of right knee: Secondary | ICD-10-CM | POA: Diagnosis not present

## 2015-05-29 ENCOUNTER — Other Ambulatory Visit: Payer: Self-pay | Admitting: Family Medicine

## 2015-06-05 DIAGNOSIS — M170 Bilateral primary osteoarthritis of knee: Secondary | ICD-10-CM | POA: Diagnosis not present

## 2015-06-05 DIAGNOSIS — M1711 Unilateral primary osteoarthritis, right knee: Secondary | ICD-10-CM | POA: Diagnosis not present

## 2015-06-05 DIAGNOSIS — M1712 Unilateral primary osteoarthritis, left knee: Secondary | ICD-10-CM | POA: Diagnosis not present

## 2015-06-13 DIAGNOSIS — M170 Bilateral primary osteoarthritis of knee: Secondary | ICD-10-CM | POA: Diagnosis not present

## 2015-06-13 DIAGNOSIS — M1712 Unilateral primary osteoarthritis, left knee: Secondary | ICD-10-CM | POA: Diagnosis not present

## 2015-06-13 DIAGNOSIS — M1711 Unilateral primary osteoarthritis, right knee: Secondary | ICD-10-CM | POA: Diagnosis not present

## 2015-06-19 DIAGNOSIS — M1711 Unilateral primary osteoarthritis, right knee: Secondary | ICD-10-CM | POA: Diagnosis not present

## 2015-06-19 DIAGNOSIS — M1712 Unilateral primary osteoarthritis, left knee: Secondary | ICD-10-CM | POA: Diagnosis not present

## 2015-06-19 DIAGNOSIS — M170 Bilateral primary osteoarthritis of knee: Secondary | ICD-10-CM | POA: Diagnosis not present

## 2015-07-16 ENCOUNTER — Other Ambulatory Visit: Payer: Self-pay | Admitting: Family Medicine

## 2015-07-16 DIAGNOSIS — H9110 Presbycusis, unspecified ear: Secondary | ICD-10-CM

## 2015-07-16 NOTE — Progress Notes (Signed)
Referral entered per e mail request.

## 2015-08-07 ENCOUNTER — Encounter: Payer: Self-pay | Admitting: Family Medicine

## 2015-08-07 ENCOUNTER — Ambulatory Visit (INDEPENDENT_AMBULATORY_CARE_PROVIDER_SITE_OTHER): Payer: Medicare Other | Admitting: Family Medicine

## 2015-08-07 VITALS — BP 115/64 | HR 56 | Temp 97.9°F | Ht 76.0 in | Wt 203.0 lb

## 2015-08-07 DIAGNOSIS — N451 Epididymitis: Secondary | ICD-10-CM

## 2015-08-07 DIAGNOSIS — N3000 Acute cystitis without hematuria: Secondary | ICD-10-CM | POA: Diagnosis not present

## 2015-08-07 DIAGNOSIS — N390 Urinary tract infection, site not specified: Secondary | ICD-10-CM | POA: Insufficient documentation

## 2015-08-07 LAB — POCT UA - MICROSCOPIC ONLY

## 2015-08-07 LAB — POCT URINALYSIS DIPSTICK
Bilirubin, UA: NEGATIVE
Glucose, UA: NEGATIVE
NITRITE UA: POSITIVE
PH UA: 6
Protein, UA: NEGATIVE
Spec Grav, UA: 1.02
UROBILINOGEN UA: 0.2

## 2015-08-07 MED ORDER — CEPHALEXIN 500 MG PO CAPS: 500.0000 mg | ORAL_CAPSULE | Freq: Four times a day (QID) | ORAL | Status: DC

## 2015-08-07 NOTE — Progress Notes (Signed)
   Subjective:    Patient ID: Ryan [name], male    DOB: 11-27-1940, 75 y.o.   MRN: [id number]  HPI Left testicular pain x 36 hours.  Now radiating into left groin.  Perhaps some frequency.  No fever, dysuria or urgency.   On flomax.  Feels stream is OK.  Feels he completely empties bladder At very low risk for STD.  No urethral DC.    Review of Systems     Objective:   Physical Exam  Left testicular discomfort to palpation (right normal)  Left groin also tender. Mild suprapubic tenderness. No CVA tenderness.        Assessment & Plan:

## 2015-08-07 NOTE — Assessment & Plan Note (Signed)
Urine looks obviously infected.  More than a simple UTI given testicular pain.  UTI likely seeded epididymus.  Culture and keflex in tissue level doses.

## 2015-08-07 NOTE — Assessment & Plan Note (Signed)
Left sided.  Likely seeded from UTI.  Rx with keflex while awaiting urine culture.  I do not feel need to cover for STD

## 2015-08-07 NOTE — Patient Instructions (Signed)
You have a bladder infection and more. You also have epididymitis. Like both are caused by the same infection The antibiotic I am giving you should knock out everything.   I have a culture cooking - likely results on Friday, to be sure the antibiotic that I am giving you kills the germ

## 2015-08-09 LAB — URINE CULTURE

## 2015-08-21 ENCOUNTER — Ambulatory Visit: Payer: Medicare Other | Attending: Audiology | Admitting: Audiology

## 2015-08-21 DIAGNOSIS — H93293 Other abnormal auditory perceptions, bilateral: Secondary | ICD-10-CM | POA: Diagnosis not present

## 2015-08-21 DIAGNOSIS — H903 Sensorineural hearing loss, bilateral: Secondary | ICD-10-CM | POA: Diagnosis not present

## 2015-08-21 DIAGNOSIS — H93292 Other abnormal auditory perceptions, left ear: Secondary | ICD-10-CM | POA: Insufficient documentation

## 2015-08-21 DIAGNOSIS — H93212 Auditory recruitment, left ear: Secondary | ICD-10-CM | POA: Insufficient documentation

## 2015-08-21 DIAGNOSIS — H748X1 Other specified disorders of right middle ear and mastoid: Secondary | ICD-10-CM | POA: Diagnosis not present

## 2015-08-21 NOTE — Procedures (Signed)
[facility]  [place], [place] [address]  [phone]   Audiological Evaluation  Patient Name: Ryan [name]   Status: Outpatient   DOB: 13-Nov-1940    Diagnosis: Hearing loss MRN: [id number] Date:  08/21/2015     Referent: [name], MD  History: Ryan [name] was seen for an audiological evaluation.  He reports increased difficulty hearing over the past 1-2 years with "softly spoken words and hearing speech spoken from other areas in his home".  He wanted to compare with previous hearing tests that he had elsewhere in 2007 and 2012.  He denies tinnitus or balance issues. He reports playing tennis regularly.  Significant history is that Ryan [name] was in the "miliary from [phone] and he is currently being treated for "deep vein thrombosis".  Medication: Ryan [name], Clorazapam.   Evaluation: Conventional pure tone audiometry from 250Hz  - 8000Hz  with using insert earphones and warbled or pure tone.  Hearing Thresholds are better on the right side and are: Right ear:  Thresholds of 30-35 dBHL from 250Hz  - 500Hz ; 20-25 dBHL from 750Hz  - 1000Hz ; 35-40 dBHL from, 2000Hz  - 3000Hz ; 50 dBHL at 4000Hz  ; 40 dBHL at 6000Hz  and 60 dBHL at 8000Hz . The hearing loss is sensorineural.  Left ear:  Thresholds of 40 dBHL from 250Hz  -500Hz ; 30 dBHL at 1000Hz ; 45 dBHL at 1500Hz ; 40 dBHL at 2000hz ; 55-60 dBHL at 3000Hz -4000Hz  and 75 dBHL at 8000Hz . Reliability is good Speech reception levels (repeating words near threshold) using recorded spondee word lists:  Right ear: 40 dBHL.  Left ear:  40 dBHL Word recognition (at comfortably loud volumes) using recorded word lists at 80 dBHL, in quiet (60 dBHL contralateral masking using speech noise).  Right ear: 100%.  Left ear:   88%% Word recognition in minimal background noise:  +5 dBHL  Right ear: 52%                              Left ear:  30%  Tympanometry has normal middle ear volume and  pressure bilaterally with slightly elevated acoustic reflexes bilaterally.  Right ear: Has greater than expected compliance  (Type Ad).   Left ear: Normal tympanic membrane compliance  (Type A).  Tone decay was completed because of the poorer hearing thresholds on the left side at 2000Hz  at 55 dBHL is negative bilaterally. This was completed because of the poorer hearing and word recognition on the left side as compared to the right side. (Note: Ryan [name] brought in previous audiograms after testing was completed).  CONCLUSION:     Ryan [name] brought in audiograms from 06/30/10 and 01/22/04.  Since 2005 Ryan [name] has had somewhat poorer hearing thresholds on the left side compared to the right.  Since 2012, the high frequency hearing thresholds in each ear have remained stable but the low frequencies have become poorer by 10-20 decibels.   Difficulty hearing in most social situations is expected, especially as the distance between speakers increases or the speakers are not directly facing each other.  Since 2005 there has been a steady decrease in hearing in both ears.  Ryan [name] today has a right sided slight to mild low frequency hearing loss with a moderate to moderately severe high frequency hearing loss that is sensorineural.  The left side has a mild to moderate sloping to a severe high frequency hearing loss that is sensorineural.  In  quiet, but a very loud levels (equivalent to loud talking at 2-3 feet) he has excellent word recognition on the right side and good word recognition on the left.  However, in minimal background noise, even when it is loud enough, Ryan [name]'s word recognition drops to poor bilaterally. Some recruitment is noted on the left side.  Ryan [name] would benefit from amplification. We do not provide amplification at this location but community resources were discussed.   RECOMMENDATIONS: 1.   Monitor hearing closely with a repeat audiological  evaluation in 6-12 months (earlier if there is any change in hearing or ear pressure) to monitor word recognition in background noise and hearing thresholds.  This evaluation may be completed here or at an ENT's office.  2.   Strategies that help improve hearing include: A) Face the speaker directly. Optimal is having the speakers face well - lit.  Unless amplified, being within 3-6 feet of the speaker will enhance word recognition. B) Avoid having the speaker back-lit as this will minimize the ability to use cues from lip-reading, facial expression and gestures. C)  Word recognition is poorer in background noise. For optimal word recognition, turn off the TV, radio or noisy fan when engaging in conversation. In a restaurant, try to sit away from noise sources and close to the primary speaker.  D)  Ask for topic clarification from time to time in order to remain in the conversation.  Most people don't mind repeating or clarifying a point when asked.  If needed, explain the difficulty hearing in background noise or hearing loss.  3.   Current research strongly indicates that learning to play a musical instrument results in improved neurological function related to auditory processing that benefits decoding and hearing in background noise. Therefore is recommended that Ryan [name] playing a musical instrument. Please be aware that being able to play the instrument well does not seem to matter, the benefit comes with the learning. Please refer to the following website for further info: www.brainvolts at Ryan [name], Annia Friendly, PhD.   5.   Remember to use hearing protection during noisy activities such as using a weed eater, moving the lawn, shooting, etc.    Musician's plugs, are available from [employer] for music related hearing protection because there is no distortion.  Other hearing protection, such as sponge plugs (available at pharmacies) or earmuffs (available at sporting  goods stores or department stores such as Paediatric nurse) are useful for noisy activities and venues.  6.   A hearing aid evaluation.  Amplification helps make the signal louder and therefore often improves hearing and word recognition.  Amplification has many forms including hearing aids in one or both ears, an assistive listening device which have a microphone and speaker such as a small handheld device and/or even a surround sound system of speakers.  Amplification may be covered by some insurances, but not all.  It is important to note that hearing aids must be individually fit according to the hearing test results and the ear shape.  Audiologists and hearing aid dealers in [place] must be licensed in order to dispense hearing aids.  In addition, a trial period is mandated by law in our state because often amplification must be tried and then evaluated in order to determine benefit.  There are many excellent choices when it comes to amplification in our area and providers are listed in the phone book under hearing aids, there are audiologists in private  practice and those affiliated with Ear, Nose and Throat physicians.  FYI, since Ryan [name] was interested, the following computer program that addresses hearing is available.  Please note that ideally this would be used in conjunction with a hearing aid - LACE  www.neurotone for [facility] [name] download or cd                                                           Khayman Kirsch L. Heide Spark, Au.D., CCC-A Doctor of Audiology 08/21/2015

## 2015-09-06 ENCOUNTER — Other Ambulatory Visit: Payer: Self-pay | Admitting: Family Medicine

## 2015-09-06 NOTE — Telephone Encounter (Signed)
Refilled for Clonazepam 1 mg #90, 1 refill called into patient's pharmacy.  Derl Barrow, RN

## 2015-10-16 DIAGNOSIS — H2512 Age-related nuclear cataract, left eye: Secondary | ICD-10-CM | POA: Diagnosis not present

## 2015-10-16 DIAGNOSIS — H02403 Unspecified ptosis of bilateral eyelids: Secondary | ICD-10-CM | POA: Diagnosis not present

## 2015-10-16 DIAGNOSIS — Z961 Presence of intraocular lens: Secondary | ICD-10-CM | POA: Diagnosis not present

## 2015-10-17 ENCOUNTER — Ambulatory Visit (INDEPENDENT_AMBULATORY_CARE_PROVIDER_SITE_OTHER): Payer: Medicare Other | Admitting: Family Medicine

## 2015-10-17 ENCOUNTER — Encounter: Payer: Self-pay | Admitting: Family Medicine

## 2015-10-17 DIAGNOSIS — R5381 Other malaise: Secondary | ICD-10-CM | POA: Diagnosis not present

## 2015-10-17 DIAGNOSIS — M2550 Pain in unspecified joint: Secondary | ICD-10-CM | POA: Diagnosis not present

## 2015-10-17 DIAGNOSIS — R5383 Other fatigue: Secondary | ICD-10-CM

## 2015-10-17 DIAGNOSIS — Z23 Encounter for immunization: Secondary | ICD-10-CM

## 2015-10-17 DIAGNOSIS — M75101 Unspecified rotator cuff tear or rupture of right shoulder, not specified as traumatic: Secondary | ICD-10-CM

## 2015-10-17 LAB — CBC WITH DIFFERENTIAL/PLATELET
BASOS PCT: 1 %
Basophils Absolute: 57 cells/uL (ref 0–200)
EOS ABS: 57 {cells}/uL (ref 15–500)
EOS PCT: 1 %
HCT: 48.3 % (ref 38.5–50.0)
Hemoglobin: 16.5 g/dL (ref 13.2–17.1)
LYMPHS PCT: 19 %
Lymphs Abs: 1083 cells/uL (ref 850–3900)
MCH: 32.3 pg (ref 27.0–33.0)
MCHC: 34.2 g/dL (ref 32.0–36.0)
MCV: 94.5 fL (ref 80.0–100.0)
MONOS PCT: 11 %
MPV: 10.3 fL (ref 7.5–12.5)
Monocytes Absolute: 627 cells/uL (ref 200–950)
NEUTROS ABS: 3876 {cells}/uL (ref 1500–7800)
Neutrophils Relative %: 68 %
PLATELETS: 213 10*3/uL (ref 140–400)
RBC: 5.11 MIL/uL (ref 4.20–5.80)
RDW: 13.1 % (ref 11.0–15.0)
WBC: 5.7 10*3/uL (ref 3.8–10.8)

## 2015-10-17 LAB — URIC ACID: URIC ACID, SERUM: 5.8 mg/dL (ref 4.0–8.0)

## 2015-10-17 LAB — COMPLETE METABOLIC PANEL WITH GFR
ALT: 19 U/L (ref 9–46)
AST: 22 U/L (ref 10–35)
Albumin: 4.3 g/dL (ref 3.6–5.1)
Alkaline Phosphatase: 70 U/L (ref 40–115)
BUN: 19 mg/dL (ref 7–25)
CALCIUM: 9.6 mg/dL (ref 8.6–10.3)
CHLORIDE: 104 mmol/L (ref 98–110)
CO2: 26 mmol/L (ref 20–31)
Creat: 1.04 mg/dL (ref 0.70–1.18)
GFR, Est African American: 80 mL/min (ref >=60)
GFR, Est Non African American: 69 mL/min (ref >=60)
Glucose, Bld: 96 mg/dL (ref 65–99)
POTASSIUM: 4.9 mmol/L (ref 3.5–5.3)
Sodium: 139 mmol/L (ref 135–146)
Total Bilirubin: 0.8 mg/dL (ref 0.2–1.2)
Total Protein: 7 g/dL (ref 6.1–8.1)

## 2015-10-17 LAB — TSH: TSH: 2.14 m[IU]/L (ref 0.40–4.50)

## 2015-10-17 LAB — RHEUMATOID FACTOR

## 2015-10-17 LAB — POCT SEDIMENTATION RATE: POCT SED RATE: 0 mm/h (ref 0–22)

## 2015-10-17 NOTE — Patient Instructions (Signed)
You likely have osteoarthritis.  I am checking for a bunch of other stuff.  I will call tomorrow with results and we will plan next steps. You also have a rotator cuff problem with your right shoulder.  Let me or your orthopedist know if you want a steroid injection in that shoulder.

## 2015-10-18 DIAGNOSIS — M75101 Unspecified rotator cuff tear or rupture of right shoulder, not specified as traumatic: Secondary | ICD-10-CM | POA: Insufficient documentation

## 2015-10-18 LAB — ANTI-NUCLEAR AB-TITER (ANA TITER): ANA Titer 1: 1:160 {titer} — ABNORMAL HIGH

## 2015-10-18 LAB — ANA: Anti Nuclear Antibody(ANA): POSITIVE — AB

## 2015-10-18 NOTE — Assessment & Plan Note (Addendum)
Most likely osteoarthritis at his age.  Will check for inflammatory arthritis Sed rate and rheumatoid factor normal.  Patient informed. Will try aleve at night.  ANA pos homogenous pattern titre 1:160  Per recs, will order anti ds Dna:  Recs: However, a test for anti-dsDNA Abs should be considered in samples with a homogeneous pattern at any titer including low titers.

## 2015-10-18 NOTE — Assessment & Plan Note (Signed)
He will return to me or ortho prn injection.

## 2015-10-18 NOTE — Assessment & Plan Note (Signed)
Probably related to pain and stress.  Will check sed rate for giant cell arteritis.  Other screening labs drawn.

## 2015-10-18 NOTE — Progress Notes (Signed)
   Subjective:    Patient ID: Ryan [name], male    DOB: 1940/03/25, 75 y.o.   MRN: [id number]  HPI  Two issues that are temporally related. 1. 2 months of generalized weakness and easy fatigue.  No focal signs, No wt loss.  No bleeding.   2. Longstanding knee arthritis.  2 month hx of joints in knees, hips shoulders, hands elbows.  "Pretty much all over."  Does not seem to be muscular.  Worse in the morning and will loosen up after an hour or so.  No swelling noted of any joints. 3. Admits to sig stress.  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  Recognizes that this stress may be playing a role in his symptoms. 4. Right shoulder is much worse than other joints.  Did get one cortisone injection by ortho without much relief.  Attributes to his continued active tennis life.    Review of Systems     Objective:   Physical Exam Rt shoulder is only joint that lacks full range of motion.  Does have positive impingment and pain with rotator cuff provocative maneuvers.        Assessment & Plan:

## 2015-10-21 ENCOUNTER — Other Ambulatory Visit: Payer: Medicare Other

## 2015-10-21 DIAGNOSIS — M2550 Pain in unspecified joint: Secondary | ICD-10-CM | POA: Diagnosis not present

## 2015-10-21 NOTE — Addendum Note (Signed)
Addended by: Zenia Resides on: 10/21/2015 10:19 AM   Modules accepted: Orders

## 2015-10-22 LAB — ANTI-DNA ANTIBODY, DOUBLE-STRANDED: DS DNA AB: 2 [IU]/mL

## 2015-11-27 ENCOUNTER — Other Ambulatory Visit: Payer: Self-pay | Admitting: Family Medicine

## 2015-11-27 DIAGNOSIS — D2262 Melanocytic nevi of left upper limb, including shoulder: Secondary | ICD-10-CM | POA: Diagnosis not present

## 2015-11-27 DIAGNOSIS — L720 Epidermal cyst: Secondary | ICD-10-CM | POA: Diagnosis not present

## 2015-11-27 DIAGNOSIS — D1801 Hemangioma of skin and subcutaneous tissue: Secondary | ICD-10-CM | POA: Diagnosis not present

## 2015-11-27 DIAGNOSIS — D2261 Melanocytic nevi of right upper limb, including shoulder: Secondary | ICD-10-CM | POA: Diagnosis not present

## 2015-11-27 DIAGNOSIS — D225 Melanocytic nevi of trunk: Secondary | ICD-10-CM | POA: Diagnosis not present

## 2015-11-27 MED ORDER — DOXYCYCLINE HYCLATE 100 MG PO TABS: 100.0000 mg | ORAL_TABLET | Freq: Two times a day (BID) | ORAL | 0 refills | Status: DC

## 2015-11-27 NOTE — Progress Notes (Signed)
E mail exchange.  I like your leap of faith suggestion.  I googled epididymitis and a lot of what I read comports with my situation.  I don't know of a downside, so if you can prescribe, I will get the treatment underway promptly.  Many thanks.  Ryan [name]  On Wed, Nov 27, 2015 at 8:49 AM, Hensel, Bill [email or web address]> wrote: Ryan [name], I am in the hospital this week.  I am sure we could work you in with someone at the [facility].  Gaynelle Arabian is a good option.  A third option would be to take a leap of faith treat for epididymitis (google that term)  with antibiotics and see you if you don't get better.  Let me know what you want.   Nicolette Bang A. Andria Frames, MD Director, [facility] Residency Direct Office # [phone] Page # [phone] Mobile # [phone]   From: [name] [[email or web address]]  Sent: Wednesday, November 27, 2015 8:44 AM To: Hensel, Bill [email or web address]> Subject: Another "minor" problem - your advice please   [name], headed for the Dermatologist this morning.  In the meantime, I have developed a swollen and sore right testicle, and similiar symptoms but less severe in the left.  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  Not debilitating, but neither does it seem to be going away by itself.   Dr. Gaynelle Arabian has seen me before, so you are my first line of defense but he might be the more logical option if I can get in to see him.  Your advice please.   Ryan [name]   --  [name] [address] [place], [place]  [address] [phone] (home) [phone] (cell) [name]

## 2015-12-17 ENCOUNTER — Other Ambulatory Visit: Payer: Self-pay | Admitting: Family Medicine

## 2015-12-26 DIAGNOSIS — M7551 Bursitis of right shoulder: Secondary | ICD-10-CM | POA: Diagnosis not present

## 2015-12-26 DIAGNOSIS — M170 Bilateral primary osteoarthritis of knee: Secondary | ICD-10-CM | POA: Diagnosis not present

## 2016-01-01 DIAGNOSIS — M170 Bilateral primary osteoarthritis of knee: Secondary | ICD-10-CM | POA: Diagnosis not present

## 2016-01-08 DIAGNOSIS — M170 Bilateral primary osteoarthritis of knee: Secondary | ICD-10-CM | POA: Diagnosis not present

## 2016-01-09 DIAGNOSIS — M7551 Bursitis of right shoulder: Secondary | ICD-10-CM | POA: Diagnosis not present

## 2016-02-04 ENCOUNTER — Emergency Department (HOSPITAL_COMMUNITY)
Admission: EM | Admit: 2016-02-04 | Discharge: 2016-02-04 | Disposition: A | Discharge status: Home / Self Care | Payer: Medicare Other | Attending: Emergency Medicine | Admitting: Emergency Medicine

## 2016-02-04 ENCOUNTER — Emergency Department (HOSPITAL_COMMUNITY): Payer: Medicare Other

## 2016-02-04 ENCOUNTER — Encounter (HOSPITAL_COMMUNITY): Payer: Self-pay | Admitting: Emergency Medicine

## 2016-02-04 DIAGNOSIS — I712 Thoracic aortic aneurysm, without rupture, unspecified: Secondary | ICD-10-CM

## 2016-02-04 DIAGNOSIS — N390 Urinary tract infection, site not specified: Secondary | ICD-10-CM | POA: Diagnosis not present

## 2016-02-04 DIAGNOSIS — R05 Cough: Secondary | ICD-10-CM | POA: Diagnosis not present

## 2016-02-04 DIAGNOSIS — Z87891 Personal history of nicotine dependence: Secondary | ICD-10-CM | POA: Diagnosis not present

## 2016-02-04 DIAGNOSIS — Z7901 Long term (current) use of anticoagulants: Secondary | ICD-10-CM | POA: Diagnosis not present

## 2016-02-04 DIAGNOSIS — R55 Syncope and collapse: Secondary | ICD-10-CM

## 2016-02-04 DIAGNOSIS — I959 Hypotension, unspecified: Secondary | ICD-10-CM | POA: Diagnosis not present

## 2016-02-04 DIAGNOSIS — R0602 Shortness of breath: Secondary | ICD-10-CM | POA: Diagnosis not present

## 2016-02-04 HISTORY — DX: Benign prostatic hyperplasia without lower urinary tract symptoms: N40.0

## 2016-02-04 LAB — CBC WITH DIFFERENTIAL/PLATELET
BASOS PCT: 0 %
Basophils Absolute: 0 10*3/uL (ref 0.0–0.1)
Eosinophils Absolute: 0 10*3/uL (ref 0.0–0.7)
Eosinophils Relative: 0 %
HEMATOCRIT: 46.2 % (ref 39.0–52.0)
Hemoglobin: 16 g/dL (ref 13.0–17.0)
Lymphocytes Relative: 15 %
Lymphs Abs: 1 10*3/uL (ref 0.7–4.0)
MCH: 32.6 pg (ref 26.0–34.0)
MCHC: 34.6 g/dL (ref 30.0–36.0)
MCV: 94.1 fL (ref 78.0–100.0)
MONO ABS: 0.5 10*3/uL (ref 0.1–1.0)
MONOS PCT: 8 %
NEUTROS ABS: 4.9 10*3/uL (ref 1.7–7.7)
Neutrophils Relative %: 77 %
Platelets: 172 10*3/uL (ref 150–400)
RBC: 4.91 MIL/uL (ref 4.22–5.81)
RDW: 13.4 % (ref 11.5–15.5)
WBC: 6.4 10*3/uL (ref 4.0–10.5)

## 2016-02-04 LAB — BASIC METABOLIC PANEL
Anion gap: 9 (ref 5–15)
BUN: 13 mg/dL (ref 6–20)
CALCIUM: 9 mg/dL (ref 8.9–10.3)
CO2: 23 mmol/L (ref 22–32)
CREATININE: 1.09 mg/dL (ref 0.61–1.24)
Chloride: 103 mmol/L (ref 101–111)
GFR calc non Af Amer: >60 mL/min (ref >60)
GLUCOSE: 123 mg/dL — AB (ref 65–99)
Potassium: 3.8 mmol/L (ref 3.5–5.1)
Sodium: 135 mmol/L (ref 135–145)

## 2016-02-04 LAB — URINALYSIS, ROUTINE W REFLEX MICROSCOPIC
Bilirubin Urine: NEGATIVE
Glucose, UA: NEGATIVE mg/dL
Hgb urine dipstick: NEGATIVE
Ketones, ur: 5 mg/dL — AB
Nitrite: POSITIVE — AB
PROTEIN: NEGATIVE mg/dL
SPECIFIC GRAVITY, URINE: 1.013 (ref 1.005–1.030)
SQUAMOUS EPITHELIAL / LPF: NONE SEEN
pH: 6 (ref 5.0–8.0)

## 2016-02-04 LAB — I-STAT CG4 LACTIC ACID, ED: LACTIC ACID, VENOUS: 0.79 mmol/L (ref 0.5–1.9)

## 2016-02-04 LAB — I-STAT TROPONIN, ED: TROPONIN I, POC: 0 ng/mL (ref 0.00–0.08)

## 2016-02-04 MED ORDER — IOPAMIDOL (ISOVUE-370) INJECTION 76%
INTRAVENOUS | Status: AC
  Filled 2016-02-04: qty 100

## 2016-02-04 MED ORDER — CEPHALEXIN 500 MG PO CAPS: 1000.0000 mg | ORAL_CAPSULE | Freq: Two times a day (BID) | ORAL | 0 refills | Status: DC

## 2016-02-04 MED ORDER — IOPAMIDOL (ISOVUE-370) INJECTION 76%
100.0000 mL | Freq: Once | INTRAVENOUS | Status: AC | PRN
  Administered 2016-02-04: 100 mL via INTRAVENOUS

## 2016-02-04 MED ORDER — SODIUM CHLORIDE 0.9 % IV BOLUS (SEPSIS)
500.0000 mL | Freq: Once | INTRAVENOUS | Status: AC
  Administered 2016-02-04: 500 mL via INTRAVENOUS

## 2016-02-04 MED ORDER — DEXTROSE 5 % IV SOLN
1.0000 g | Freq: Once | INTRAVENOUS | Status: AC
  Administered 2016-02-04: 1 g via INTRAVENOUS
  Filled 2016-02-04: qty 10

## 2016-02-04 MED ORDER — SODIUM CHLORIDE 0.9 % IV BOLUS (SEPSIS)
1000.0000 mL | Freq: Once | INTRAVENOUS | Status: AC
  Administered 2016-02-04: 1000 mL via INTRAVENOUS

## 2016-02-04 NOTE — ED Provider Notes (Signed)
[facility] DEPT Provider Note   CSN: [id number] Arrival date & time: 02/04/16  0408     History   Chief Complaint Chief Complaint  Patient presents with  . Loss of Consciousness    HPI Ryan [name] is a 75 y.o. male.  Patient presents with complaints of syncope. Patient brought to the emergency department from home by EMS. Patient reports that he had gotten up to go to the bathroom and then woke up on the floor. He denies injury. He has been sick for the last 2 days with a productive cough. No vomiting or diarrhea. He has had urinary frequency but no dysuria or hematuria. EMS reported the patient was mildly hypoxic and orthostatic upon their initial evaluation.      Past Medical History:  Diagnosis Date  . Arthritis   . BPH (benign prostatic hyperplasia)   . DVT (deep venous thrombosis) ([place])    x2 "Xarelto use"  . Hyperlipidemia     Patient Active Problem List   Diagnosis Date Noted  . Rotator cuff syndrome of right shoulder 10/18/2015  . Polyarthralgia 10/17/2015  . Presbycusis 07/16/2015  . Headache 11/14/2014  . Routine general medical examination at a health care facility 06/23/2012  . Metatarsalgia 05/06/2011  . Tear of meniscus of left knee 09/16/2010  . Benign prostatic hypertrophy 06/05/2010  . Bradycardia 07/05/2009  . Malaise and fatigue 07/05/2009  . COLONIC POLYPS 04/06/2008  . DEGENERATIVE JOINT DISEASE, AXIAL SKELETON 06/10/2006  . Pure hypercholesterolemia 04/08/2006  . RESTLESS LEGS SYNDROME 04/08/2006  . History of DVT (deep vein thrombosis) 04/08/2006  . VENOUS INSUFFICIENCY, CHRONIC 04/08/2006  . BACK PAIN, LOW 04/08/2006    Past Surgical History:  Procedure Laterality Date  . APPENDECTOMY    . CATARACT EXTRACTION Right   . COLONOSCOPY W/ POLYPECTOMY     multiple  . COLONOSCOPY WITH PROPOFOL N/A 10/30/2014   Procedure: COLONOSCOPY WITH PROPOFOL;  Surgeon: Garlan Fair, MD;  Location: [facility] ENDOSCOPY;  Service: Endoscopy;   Laterality: N/A;  . EYE SURGERY     retinal repair bilateral  . HERNIA REPAIR     BIH repair child  . KNEE ARTHROSCOPY Left   . SEPTOPLASTY    . TONSILLECTOMY         Home Medications    Prior to Admission medications   Medication Sig Start Date End Date Taking? Authorizing Provider  clonazePAM (KLONOPIN) 1 MG tablet TAKE 1 TABLET AT BEDTIME AS NEEDED FOR SLEEP. 09/06/15  Yes Zenia Resides, MD  Glucosamine-Chondroit-Vit C-Mn (GLUCOSAMINE-CHONDROITIN) TABS Take 1 tablet by mouth daily.   Yes Historical Provider, MD  Multiple Vitamin (MULTIVITAMIN WITH MINERALS) TABS tablet Take 1 tablet by mouth daily.   Yes Historical Provider, MD  Pseudoeph-Doxylamine-DM-APAP (NYQUIL PO) Take 1 capsule by mouth at bedtime as needed (sleep).   Yes Historical Provider, MD  tamsulosin (FLOMAX) 0.4 MG CAPS capsule TAKE (1) CAPSULE DAILY. 12/17/15  Yes Zenia Resides, MD  XARELTO 20 MG TABS tablet TAKE 1 TABLET ONCE DAILY WITH SUPPER. 05/29/15  Yes Zenia Resides, MD  doxycycline (VIBRA-TABS) 100 MG tablet Take 1 tablet (100 mg total) by mouth 2 (two) times daily. Patient not taking: Reported on 02/04/2016 11/27/15   Zenia Resides, MD    Family History Family History  Problem Relation Age of Onset  . Alzheimer's disease Mother   . Arthritis Mother   . Arthritis Father   . Cancer Sister     breast  . Cancer Brother  melanoma  . Stroke Paternal Grandfather     Social History Social History  Substance Use Topics  . Smoking status: Former Smoker    Quit date: 02/10/1980  . Smokeless tobacco: Never Used  . Alcohol use 7.0 oz/week    14 Standard drinks or equivalent per week     Comment:  daily     Allergies   Simvastatin and Warfarin sodium   Review of Systems Review of Systems  Respiratory: Positive for cough.   Genitourinary: Positive for frequency.  Neurological: Positive for syncope.  All other systems reviewed and are negative.    Physical Exam Updated Vital  Signs BP 151/73   Pulse (!) 59   Temp 98.7 F (37.1 C) (Oral)   Resp 15   Ht 6\' 4"  (1.93 m)   Wt 204 lb (92.5 kg)   SpO2 96%   BMI 24.83 kg/m   Physical Exam  Constitutional: He is oriented to person, place, and time. He appears well-developed and well-nourished. No distress.  HENT:  Head: Normocephalic and atraumatic.  Right Ear: Hearing normal.  Left Ear: Hearing normal.  Nose: Nose normal.  Mouth/Throat: Oropharynx is clear and moist and mucous membranes are normal.  Eyes: Conjunctivae and EOM are normal. Pupils are equal, round, and reactive to light.  Neck: Normal range of motion. Neck supple.  Cardiovascular: Regular rhythm, S1 normal and S2 normal.  Exam reveals no gallop and no friction rub.   No murmur heard. Pulmonary/Chest: Effort normal and breath sounds normal. No respiratory distress. He exhibits no tenderness.  Abdominal: Soft. Normal appearance and bowel sounds are normal. There is no hepatosplenomegaly. There is no tenderness. There is no rebound, no guarding, no tenderness at McBurney's point and negative Murphy's sign. No hernia.  Musculoskeletal: Normal range of motion.  Neurological: He is alert and oriented to person, place, and time. He has normal strength. No cranial nerve deficit or sensory deficit. Coordination normal. GCS eye subscore is 4. GCS verbal subscore is 5. GCS motor subscore is 6.  Skin: Skin is warm, dry and intact. No rash noted. No cyanosis.  Psychiatric: He has a normal mood and affect. His speech is normal and behavior is normal. Thought content normal.  Nursing note and vitals reviewed.    ED Treatments / Results  Labs (all labs ordered are listed, but only abnormal results are displayed) Labs Reviewed  BASIC METABOLIC PANEL - Abnormal; Notable for the following:       Result Value   Glucose, Bld 123 (*)    All other components within normal limits  URINALYSIS, ROUTINE W REFLEX MICROSCOPIC - Abnormal; Notable for the following:     APPearance HAZY (*)    Ketones, ur 5 (*)    Nitrite POSITIVE (*)    Leukocytes, UA LARGE (*)    Bacteria, UA MANY (*)    All other components within normal limits  URINE CULTURE  CBC WITH DIFFERENTIAL/PLATELET  I-STAT CG4 LACTIC ACID, ED  Randolm Idol, ED    EKG  EKG Interpretation  Date/Time:  Tuesday February 04 2016 04:47:15 EST Ventricular Rate:  60 PR Interval:    QRS Duration: 106 QT Interval:  437 QTC Calculation: 437 R Axis:   17 Text Interpretation:  Sinus rhythm Normal ECG Confirmed by Betsey Holiday  MD, Shilah Hefel ([id number]) on 02/04/2016 5:06:16 AM       Radiology Dg Chest 2 View  Result Date: 02/04/2016 CLINICAL DATA:  Cough for 1 week. EXAM: CHEST  2 VIEW COMPARISON:  Radiographs 11/27/2014 FINDINGS: Heart size at the upper limits normal. Mediastinal contours are normal. The lungs are hyperinflated with chronic bronchitic change. No focal airspace opacity. No pleural effusion or pneumothorax. No acute osseous abnormality. IMPRESSION: Chronic hyperinflation and bronchial thickening. No acute abnormality. Electronically Signed   By: Jeb Levering M.D.   On: 02/04/2016 04:54    Procedures Procedures (including critical care time)  Medications Ordered in ED Medications  cefTRIAXone (ROCEPHIN) 1 g in dextrose 5 % 50 mL IVPB (not administered)  sodium chloride 0.9 % bolus 500 mL (0 mLs Intravenous Stopped 02/04/16 0527)     Initial Impression / Assessment and Plan / ED Course  I have reviewed the triage vital signs and the nursing notes.  Pertinent labs & imaging results that were available during my care of the patient were reviewed by me and considered in my medical decision making (see chart for details).  Clinical Course   Patient presents from home for evaluation of syncopal episode. Patient denies injury, woke up on the floor after attempting to go to the bathroom. Had been experiencing some urinary frequency. He also has been sick for the last couple of  days with cough. Chest x-ray does not show any evidence of pneumonia. As patient does have a history of DVT, although on Xarelto, PE considered. Will obtain CT angiography to rule out PE. Blood work was unremarkable. No sign of sepsis. Urinalysis shows obvious infection which is likely the cause of the patient's symptoms. Initiated on Rocephin.  Patient will be signed out to oncoming ER physician to follow-up CT angiography. If negative, anticipate discharge with continued outpatient antibiotic therapy for urinary tract infection.  Final Clinical Impressions(s) / ED Diagnoses   Final diagnoses:  Syncope, unspecified syncope type  Urinary tract infection without hematuria, site unspecified    New Prescriptions New Prescriptions   No medications on file     Orpah Greek, MD 02/04/16 [phone]

## 2016-02-04 NOTE — ED Notes (Signed)
Patient is alert and oriented x3.  He was given DC instructions and follow up visit instructions.  Patient gave verbal understanding.  He was DC ambulatory under his own power to home.  V/S stable.  He was not showing any signs of distress on DC 

## 2016-02-04 NOTE — ED Triage Notes (Signed)
Pt BIB EMS from home for syncopable episode; pt's wife heard a thud upstairs and found pt on ground; per EMS pt had orthostatic changes from sitting to standing; pt was mildly confused on EMS arrival and was unsure of date on arrival to ER

## 2016-02-04 NOTE — ED Notes (Signed)
Bed: [id number] Expected date:  Expected time:  Means of arrival:  Comments: 75 yo M/ Syncope

## 2016-02-04 NOTE — ED Notes (Signed)
Pt transported to XRay 

## 2016-02-04 NOTE — ED Notes (Signed)
Pt transported to CT ?

## 2016-02-06 DIAGNOSIS — R55 Syncope and collapse: Secondary | ICD-10-CM | POA: Diagnosis not present

## 2016-02-06 DIAGNOSIS — I712 Thoracic aortic aneurysm, without rupture: Secondary | ICD-10-CM | POA: Diagnosis not present

## 2016-02-06 DIAGNOSIS — Z888 Allergy status to other drugs, medicaments and biological substances status: Secondary | ICD-10-CM | POA: Diagnosis not present

## 2016-02-06 DIAGNOSIS — Z01818 Encounter for other preprocedural examination: Secondary | ICD-10-CM | POA: Diagnosis not present

## 2016-02-06 DIAGNOSIS — H2512 Age-related nuclear cataract, left eye: Secondary | ICD-10-CM | POA: Diagnosis not present

## 2016-02-06 DIAGNOSIS — R011 Cardiac murmur, unspecified: Secondary | ICD-10-CM | POA: Diagnosis not present

## 2016-02-06 DIAGNOSIS — Z87891 Personal history of nicotine dependence: Secondary | ICD-10-CM | POA: Diagnosis not present

## 2016-02-06 DIAGNOSIS — Z86718 Personal history of other venous thrombosis and embolism: Secondary | ICD-10-CM | POA: Diagnosis not present

## 2016-02-06 DIAGNOSIS — Z7901 Long term (current) use of anticoagulants: Secondary | ICD-10-CM | POA: Diagnosis not present

## 2016-02-13 ENCOUNTER — Ambulatory Visit (INDEPENDENT_AMBULATORY_CARE_PROVIDER_SITE_OTHER): Payer: Medicare Other | Admitting: Family Medicine

## 2016-02-13 ENCOUNTER — Encounter: Payer: Self-pay | Admitting: Family Medicine

## 2016-02-13 DIAGNOSIS — G4700 Insomnia, unspecified: Secondary | ICD-10-CM | POA: Insufficient documentation

## 2016-02-13 DIAGNOSIS — N400 Enlarged prostate without lower urinary tract symptoms: Secondary | ICD-10-CM

## 2016-02-13 DIAGNOSIS — F5101 Primary insomnia: Secondary | ICD-10-CM | POA: Diagnosis not present

## 2016-02-13 DIAGNOSIS — I712 Thoracic aortic aneurysm, without rupture, unspecified: Secondary | ICD-10-CM

## 2016-02-13 DIAGNOSIS — N3000 Acute cystitis without hematuria: Secondary | ICD-10-CM

## 2016-02-13 DIAGNOSIS — R55 Syncope and collapse: Secondary | ICD-10-CM

## 2016-02-13 LAB — POCT URINALYSIS DIPSTICK
BILIRUBIN UA: NEGATIVE
Glucose, UA: NEGATIVE
KETONES UA: NEGATIVE
LEUKOCYTES UA: NEGATIVE
Nitrite, UA: NEGATIVE
PH UA: 6
PROTEIN UA: NEGATIVE
RBC UA: NEGATIVE
SPEC GRAV UA: 1.015
Urobilinogen, UA: 0.2

## 2016-02-13 MED ORDER — TRAZODONE HCL 50 MG PO TABS: 50.0000 mg | ORAL_TABLET | Freq: Every evening | ORAL | 3 refills | Status: DC | PRN

## 2016-02-13 NOTE — Patient Instructions (Addendum)
Urine was fine, no infection. Drink fluids (water) throughout the day.  Don't rely on thirst. Try trazodone for sleep.  Safer than clonazepam.  If you are really interested in drugs to avoid in the elderly, google Beers list. I hope we don't need to stop the flomax/tamsulosin.  Keep an eye on those lightheaded spells. The only over the counter cold medication you should take would be Robitussin DM.  Otherwise deal with it. I suggest we plan to repeat the CT is 5 months - early May.  Call me late April and I will set it up. OK to have the eye surgery.  Make sure you ask about Xarelto.  Here is the result of the 02/04/16 chest CT: IMPRESSION: 1. No CT evidence for acute pulmonary embolus. 2. 12 mm short axis right hilar lymph node is indeterminate given that this represents only minimal enlargement and appears to represent an isolated finding. Repeat CT chest in 3-6 months could be performed to ensure stability. Given hilar position, intravenous contrast recommended at the time of followup. 3. **An incidental finding of potential clinical significance has been found. 10 mm ground-glass nodule central right upper lobe. Initial follow-up with CT at 6-12 months is recommended to confirm persistence. If persistent, repeat CT is recommended every 2 years until 5 years of stability has been established. This recommendation follows the consensus statement: Guidelines for Management of Incidental Pulmonary Nodules Detected on CT Images: From the Fleischner Society 2017; Radiology 2017; 284:228-243.** 4. **An incidental finding of potential clinical significance has been found. Ascending thoracic aorta measures 4.9 cm maximum diameter. Ascending thoracic aortic aneurysm. Recommend semi-annual imaging followup by CTA or MRA and referral to cardiothoracic surgery if not already obtained. This recommendation follows 2010 ACCF/AHA/AATS/ACR/ASA/SCA/SCAI/SIR/STS/SVM Guidelines for the Diagnosis and  Management of Patients With Thoracic Aortic Disease. Circulation. 2010; 121SP:1689793. ** 5. Coronary artery and thoracic aortic atherosclerosis.

## 2016-02-13 NOTE — Assessment & Plan Note (Signed)
Stop clonazapam.  Trial of trazodone.

## 2016-02-14 DIAGNOSIS — I712 Thoracic aortic aneurysm, without rupture, unspecified: Secondary | ICD-10-CM | POA: Insufficient documentation

## 2016-02-14 DIAGNOSIS — R55 Syncope and collapse: Secondary | ICD-10-CM | POA: Insufficient documentation

## 2016-02-14 NOTE — Progress Notes (Signed)
   Subjective:    Patient ID: Ryan [name], male    DOB: Oct 23, 1940, 76 y.o.   MRN: [id number]  HPI Multiple issues: 1. Syncope.  Seen in ER.  Occurred at night when taking clonazepam and a diphenhydramine containing OTC cold med.  Also on flomax.  ER felt dehydration contributed. 2. Incidental finding of thoracic aortic aneurism 3. Clonazepam is a Radiation protection practitioner drug.  Associated with falls in the elderly.  Will switch to trazodone. 4. Had UTI also in ER visit for syncope.  Feels like it is cleared.  Needs flomax for voiding.    Review of Systems     Objective:   Physical ExamLungs clear Cardiac RRR without m or g.        Assessment & Plan:

## 2016-02-14 NOTE — Assessment & Plan Note (Signed)
Cleared, Continue flomax.  Encourage fluides.

## 2016-02-14 NOTE — Assessment & Plan Note (Signed)
Cont flomax for now.  Needed for urine flow.  Can contribute to orthostasis.

## 2016-02-14 NOTE — Assessment & Plan Note (Signed)
Multifactorial.   1. Encourage fluids. 2. Stop clonazepam 3. Stop benadryl

## 2016-02-14 NOTE — Assessment & Plan Note (Signed)
Reimage in 6 months.

## 2016-02-25 LAB — URINE CULTURE

## 2016-02-26 ENCOUNTER — Telehealth (HOSPITAL_BASED_OUTPATIENT_CLINIC_OR_DEPARTMENT_OTHER): Payer: Self-pay | Admitting: *Deleted

## 2016-02-26 NOTE — Telephone Encounter (Signed)
Post ED Visit - Positive Culture Follow-up  Culture report reviewed by antimicrobial stewardship pharmacist:  []  Elenor Quinones, Pharm.D. []  Heide Guile, Pharm.D., BCPS []  Parks Neptune, Pharm.D. []  Alycia Rossetti, Pharm.D., BCPS []  [place], [place].D., BCPS, AAHIVP []  Legrand Como, Pharm.D., BCPS, AAHIVP []  Milus Glazier, Pharm.D. []  Stephens [place], [place].D. Joe Arminger, Pharm D  Positive urine culture Treated with Cephalexin, organism sensitive to the same and no further patient follow-up is required at this time.  [place], [facility] 02/26/2016, 10:50 AM

## 2016-03-02 ENCOUNTER — Other Ambulatory Visit: Payer: Self-pay | Admitting: *Deleted

## 2016-03-02 MED ORDER — CLONAZEPAM 1 MG PO TABS: 1.0000 mg | ORAL_TABLET | Freq: Every evening | ORAL | 1 refills | Status: DC | PRN

## 2016-03-02 NOTE — Telephone Encounter (Signed)
Called.  Trazodone ineffective.  He is aware of increased fall risk.  Will go back to clonapin.

## 2016-04-01 DIAGNOSIS — Z961 Presence of intraocular lens: Secondary | ICD-10-CM | POA: Diagnosis not present

## 2016-04-01 DIAGNOSIS — H02403 Unspecified ptosis of bilateral eyelids: Secondary | ICD-10-CM | POA: Diagnosis not present

## 2016-04-01 DIAGNOSIS — H2512 Age-related nuclear cataract, left eye: Secondary | ICD-10-CM | POA: Diagnosis not present

## 2016-05-16 ENCOUNTER — Other Ambulatory Visit: Payer: Self-pay | Admitting: Family Medicine

## 2016-05-21 DIAGNOSIS — K219 Gastro-esophageal reflux disease without esophagitis: Secondary | ICD-10-CM | POA: Diagnosis not present

## 2016-05-21 DIAGNOSIS — H25811 Combined forms of age-related cataract, right eye: Secondary | ICD-10-CM | POA: Diagnosis not present

## 2016-05-21 DIAGNOSIS — Z7901 Long term (current) use of anticoagulants: Secondary | ICD-10-CM | POA: Diagnosis not present

## 2016-05-21 DIAGNOSIS — H25812 Combined forms of age-related cataract, left eye: Secondary | ICD-10-CM | POA: Diagnosis not present

## 2016-06-08 ENCOUNTER — Telehealth: Payer: Self-pay | Admitting: Family Medicine

## 2016-06-08 DIAGNOSIS — I712 Thoracic aortic aneurysm, without rupture, unspecified: Secondary | ICD-10-CM

## 2016-06-08 NOTE — Telephone Encounter (Signed)
Pt needs a f/u CT scan scheduled. ep

## 2016-06-08 NOTE — Telephone Encounter (Signed)
Needs CT FU of thoracic aneurism and lung nodule.  Ordered.  Also ordered BMP due to need for dye.

## 2016-06-09 ENCOUNTER — Other Ambulatory Visit: Payer: Medicare Other

## 2016-06-09 DIAGNOSIS — I712 Thoracic aortic aneurysm, without rupture, unspecified: Secondary | ICD-10-CM

## 2016-06-10 LAB — BASIC METABOLIC PANEL
BUN / CREAT RATIO: 15 (ref 10–24)
BUN: 17 mg/dL (ref 8–27)
CALCIUM: 9.4 mg/dL (ref 8.6–10.2)
CHLORIDE: 106 mmol/L (ref 96–106)
CO2: 20 mmol/L (ref 18–29)
Creatinine, Ser: 1.12 mg/dL (ref 0.76–1.27)
GFR, EST AFRICAN AMERICAN: 73 mL/min/{1.73_m2} (ref >59)
GFR, EST NON AFRICAN AMERICAN: 63 mL/min/{1.73_m2} (ref >59)
Glucose: 97 mg/dL (ref 65–99)
Potassium: 4.6 mmol/L (ref 3.5–5.2)
Sodium: 144 mmol/L (ref 134–144)

## 2016-06-11 ENCOUNTER — Ambulatory Visit
Admission: RE | Admit: 2016-06-11 | Discharge: 2016-06-11 | Disposition: A | Discharge status: Home / Self Care | Payer: Medicare Other | Source: Ambulatory Visit | Attending: Family Medicine | Admitting: Family Medicine

## 2016-06-11 ENCOUNTER — Other Ambulatory Visit: Payer: Self-pay | Admitting: Family Medicine

## 2016-06-11 DIAGNOSIS — I712 Thoracic aortic aneurysm, without rupture, unspecified: Secondary | ICD-10-CM

## 2016-06-11 DIAGNOSIS — I714 Abdominal aortic aneurysm, without rupture, unspecified: Secondary | ICD-10-CM | POA: Insufficient documentation

## 2016-06-11 MED ORDER — IOPAMIDOL (ISOVUE-370) INJECTION 76%
80.0000 mL | Freq: Once | INTRAVENOUS | Status: AC | PRN
  Administered 2016-06-11: 80 mL via INTRAVENOUS

## 2016-06-22 ENCOUNTER — Encounter (HOSPITAL_COMMUNITY): Payer: Self-pay

## 2016-06-22 ENCOUNTER — Inpatient Hospital Stay (HOSPITAL_COMMUNITY)
Admission: EM | Admit: 2016-06-22 | Discharge: 2016-06-25 | DRG: 872 | Disposition: A | Discharge status: Home / Self Care | Payer: Medicare Other | Attending: Family Medicine | Admitting: Family Medicine

## 2016-06-22 ENCOUNTER — Emergency Department (HOSPITAL_COMMUNITY): Payer: Medicare Other

## 2016-06-22 DIAGNOSIS — B9620 Unspecified Escherichia coli [E. coli] as the cause of diseases classified elsewhere: Secondary | ICD-10-CM | POA: Diagnosis not present

## 2016-06-22 DIAGNOSIS — I714 Abdominal aortic aneurysm, without rupture: Secondary | ICD-10-CM | POA: Diagnosis not present

## 2016-06-22 DIAGNOSIS — J181 Lobar pneumonia, unspecified organism: Secondary | ICD-10-CM

## 2016-06-22 DIAGNOSIS — R509 Fever, unspecified: Secondary | ICD-10-CM | POA: Diagnosis not present

## 2016-06-22 DIAGNOSIS — I712 Thoracic aortic aneurysm, without rupture: Secondary | ICD-10-CM | POA: Diagnosis not present

## 2016-06-22 DIAGNOSIS — G4700 Insomnia, unspecified: Secondary | ICD-10-CM | POA: Diagnosis not present

## 2016-06-22 DIAGNOSIS — N400 Enlarged prostate without lower urinary tract symptoms: Secondary | ICD-10-CM | POA: Diagnosis present

## 2016-06-22 DIAGNOSIS — H9110 Presbycusis, unspecified ear: Secondary | ICD-10-CM | POA: Diagnosis present

## 2016-06-22 DIAGNOSIS — Z87891 Personal history of nicotine dependence: Secondary | ICD-10-CM

## 2016-06-22 DIAGNOSIS — R6520 Severe sepsis without septic shock: Secondary | ICD-10-CM | POA: Diagnosis not present

## 2016-06-22 DIAGNOSIS — M549 Dorsalgia, unspecified: Secondary | ICD-10-CM | POA: Diagnosis present

## 2016-06-22 DIAGNOSIS — N179 Acute kidney failure, unspecified: Secondary | ICD-10-CM | POA: Diagnosis present

## 2016-06-22 DIAGNOSIS — Z86010 Personal history of colonic polyps: Secondary | ICD-10-CM | POA: Diagnosis not present

## 2016-06-22 DIAGNOSIS — E7800 Pure hypercholesterolemia, unspecified: Secondary | ICD-10-CM | POA: Diagnosis not present

## 2016-06-22 DIAGNOSIS — B965 Pseudomonas (aeruginosa) (mallei) (pseudomallei) as the cause of diseases classified elsewhere: Secondary | ICD-10-CM | POA: Diagnosis not present

## 2016-06-22 DIAGNOSIS — A4150 Gram-negative sepsis, unspecified: Principal | ICD-10-CM | POA: Diagnosis present

## 2016-06-22 DIAGNOSIS — Z79899 Other long term (current) drug therapy: Secondary | ICD-10-CM

## 2016-06-22 DIAGNOSIS — A419 Sepsis, unspecified organism: Secondary | ICD-10-CM

## 2016-06-22 DIAGNOSIS — J9811 Atelectasis: Secondary | ICD-10-CM | POA: Diagnosis not present

## 2016-06-22 DIAGNOSIS — Z7901 Long term (current) use of anticoagulants: Secondary | ICD-10-CM | POA: Diagnosis not present

## 2016-06-22 DIAGNOSIS — R0902 Hypoxemia: Secondary | ICD-10-CM | POA: Diagnosis not present

## 2016-06-22 DIAGNOSIS — I872 Venous insufficiency (chronic) (peripheral): Secondary | ICD-10-CM | POA: Diagnosis present

## 2016-06-22 DIAGNOSIS — E785 Hyperlipidemia, unspecified: Secondary | ICD-10-CM | POA: Diagnosis not present

## 2016-06-22 DIAGNOSIS — Z87440 Personal history of urinary (tract) infections: Secondary | ICD-10-CM | POA: Diagnosis not present

## 2016-06-22 DIAGNOSIS — N390 Urinary tract infection, site not specified: Secondary | ICD-10-CM

## 2016-06-22 DIAGNOSIS — G2581 Restless legs syndrome: Secondary | ICD-10-CM | POA: Diagnosis present

## 2016-06-22 DIAGNOSIS — J189 Pneumonia, unspecified organism: Secondary | ICD-10-CM

## 2016-06-22 DIAGNOSIS — E860 Dehydration: Secondary | ICD-10-CM | POA: Diagnosis not present

## 2016-06-22 DIAGNOSIS — Z888 Allergy status to other drugs, medicaments and biological substances status: Secondary | ICD-10-CM | POA: Diagnosis not present

## 2016-06-22 DIAGNOSIS — Z86718 Personal history of other venous thrombosis and embolism: Secondary | ICD-10-CM

## 2016-06-22 DIAGNOSIS — R109 Unspecified abdominal pain: Secondary | ICD-10-CM | POA: Diagnosis not present

## 2016-06-22 DIAGNOSIS — R079 Chest pain, unspecified: Secondary | ICD-10-CM | POA: Diagnosis not present

## 2016-06-22 LAB — I-STAT CHEM 8, ED
BUN: 26 mg/dL — AB (ref 6–20)
Calcium, Ion: 1.09 mmol/L — ABNORMAL LOW (ref 1.15–1.40)
Chloride: 107 mmol/L (ref 101–111)
Creatinine, Ser: 1.1 mg/dL (ref 0.61–1.24)
Glucose, Bld: 134 mg/dL — ABNORMAL HIGH (ref 65–99)
HEMATOCRIT: 46 % (ref 39.0–52.0)
Hemoglobin: 15.6 g/dL (ref 13.0–17.0)
POTASSIUM: 3.9 mmol/L (ref 3.5–5.1)
Sodium: 138 mmol/L (ref 135–145)
TCO2: 19 mmol/L (ref 0–100)

## 2016-06-22 LAB — CBC WITH DIFFERENTIAL/PLATELET
Basophils Absolute: 0 10*3/uL (ref 0.0–0.1)
Basophils Relative: 0 %
EOS ABS: 0 10*3/uL (ref 0.0–0.7)
EOS PCT: 0 %
HCT: 46.9 % (ref 39.0–52.0)
Hemoglobin: 15.8 g/dL (ref 13.0–17.0)
LYMPHS ABS: 0.2 10*3/uL — AB (ref 0.7–4.0)
Lymphocytes Relative: 6 %
MCH: 32 pg (ref 26.0–34.0)
MCHC: 33.7 g/dL (ref 30.0–36.0)
MCV: 94.9 fL (ref 78.0–100.0)
MONOS PCT: 10 %
Monocytes Absolute: 0.4 10*3/uL (ref 0.1–1.0)
Neutro Abs: 3.1 10*3/uL (ref 1.7–7.7)
Neutrophils Relative %: 84 %
PLATELETS: 154 10*3/uL (ref 150–400)
RBC: 4.94 MIL/uL (ref 4.22–5.81)
RDW: 13.5 % (ref 11.5–15.5)
WBC: 3.7 10*3/uL — AB (ref 4.0–10.5)

## 2016-06-22 LAB — URINALYSIS, ROUTINE W REFLEX MICROSCOPIC
BILIRUBIN URINE: NEGATIVE
Glucose, UA: NEGATIVE mg/dL
Hgb urine dipstick: NEGATIVE
Ketones, ur: NEGATIVE mg/dL
NITRITE: POSITIVE — AB
Protein, ur: NEGATIVE mg/dL
SPECIFIC GRAVITY, URINE: 1.016 (ref 1.005–1.030)
Squamous Epithelial / HPF: NONE SEEN
pH: 6 (ref 5.0–8.0)

## 2016-06-22 LAB — I-STAT CG4 LACTIC ACID, ED: LACTIC ACID, VENOUS: 3.57 mmol/L — AB (ref 0.5–1.9)

## 2016-06-22 LAB — I-STAT TROPONIN, ED: Troponin i, poc: 0 ng/mL (ref 0.00–0.08)

## 2016-06-22 LAB — COMPREHENSIVE METABOLIC PANEL
ALT: 32 U/L (ref 17–63)
ANION GAP: 13 (ref 5–15)
AST: 57 U/L — ABNORMAL HIGH (ref 15–41)
Albumin: 3.7 g/dL (ref 3.5–5.0)
Alkaline Phosphatase: 62 U/L (ref 38–126)
BUN: 24 mg/dL — ABNORMAL HIGH (ref 6–20)
CALCIUM: 8.8 mg/dL — AB (ref 8.9–10.3)
CHLORIDE: 105 mmol/L (ref 101–111)
CO2: 19 mmol/L — AB (ref 22–32)
Creatinine, Ser: 1.37 mg/dL — ABNORMAL HIGH (ref 0.61–1.24)
GFR calc non Af Amer: 48 mL/min — ABNORMAL LOW (ref >60)
GFR, EST AFRICAN AMERICAN: 56 mL/min — AB (ref >60)
Glucose, Bld: 135 mg/dL — ABNORMAL HIGH (ref 65–99)
Potassium: 3.9 mmol/L (ref 3.5–5.1)
SODIUM: 137 mmol/L (ref 135–145)
Total Bilirubin: 1.3 mg/dL — ABNORMAL HIGH (ref 0.3–1.2)
Total Protein: 6.2 g/dL — ABNORMAL LOW (ref 6.5–8.1)

## 2016-06-22 LAB — MRSA PCR SCREENING: MRSA BY PCR: NEGATIVE

## 2016-06-22 LAB — LACTIC ACID, PLASMA
LACTIC ACID, VENOUS: 1.3 mmol/L (ref 0.5–1.9)
LACTIC ACID, VENOUS: 2.6 mmol/L — AB (ref 0.5–1.9)
LACTIC ACID, VENOUS: 0.9 mmol/L (ref 0.5–1.9)

## 2016-06-22 LAB — LIPASE, BLOOD: Lipase: 37 U/L (ref 11–51)

## 2016-06-22 MED ORDER — ATORVASTATIN CALCIUM 10 MG PO TABS
10.0000 mg | ORAL_TABLET | Freq: Every day | ORAL | Status: DC
  Administered 2016-06-22: 10 mg via ORAL
  Filled 2016-06-22: qty 1

## 2016-06-22 MED ORDER — FOLIC ACID 1 MG PO TABS
1.0000 mg | ORAL_TABLET | Freq: Every day | ORAL | Status: DC
  Administered 2016-06-22 – 2016-06-25 (×4): 1 mg via ORAL
  Filled 2016-06-22 (×4): qty 1

## 2016-06-22 MED ORDER — ADULT MULTIVITAMIN W/MINERALS CH
1.0000 | ORAL_TABLET | Freq: Every day | ORAL | Status: DC
  Administered 2016-06-22 – 2016-06-25 (×4): 1 via ORAL
  Filled 2016-06-22 (×4): qty 1

## 2016-06-22 MED ORDER — SODIUM CHLORIDE 0.9 % IV BOLUS (SEPSIS): 1000.0000 mL | Freq: Once | INTRAVENOUS | Status: DC

## 2016-06-22 MED ORDER — SODIUM CHLORIDE 0.9% FLUSH
3.0000 mL | Freq: Two times a day (BID) | INTRAVENOUS | Status: DC
  Administered 2016-06-22 – 2016-06-25 (×5): 3 mL via INTRAVENOUS

## 2016-06-22 MED ORDER — DEXTROSE 5 % IV SOLN
500.0000 mg | Freq: Once | INTRAVENOUS | Status: AC
  Administered 2016-06-22: 500 mg via INTRAVENOUS
  Filled 2016-06-22: qty 500

## 2016-06-22 MED ORDER — DEXTROSE 5 % IV SOLN: 250.0000 mg | INTRAVENOUS | Status: DC

## 2016-06-22 MED ORDER — ACETAMINOPHEN 325 MG PO TABS
650.0000 mg | ORAL_TABLET | Freq: Four times a day (QID) | ORAL | Status: DC | PRN
Start: 2016-06-22 — End: 2016-06-25
  Administered 2016-06-22: 650 mg via ORAL
  Filled 2016-06-22: qty 2

## 2016-06-22 MED ORDER — DEXTROSE 5 % IV SOLN: 1.0000 g | INTRAVENOUS | Status: DC

## 2016-06-22 MED ORDER — SODIUM CHLORIDE 0.9 % IV SOLN
INTRAVENOUS | Status: DC
  Administered 2016-06-22: 15:00:00 via INTRAVENOUS

## 2016-06-22 MED ORDER — DEXTROSE 5 % IV SOLN
2.0000 g | Freq: Once | INTRAVENOUS | Status: AC
  Administered 2016-06-22: 2 g via INTRAVENOUS
  Filled 2016-06-22: qty 2

## 2016-06-22 MED ORDER — SODIUM CHLORIDE 0.9 % IV BOLUS (SEPSIS)
1000.0000 mL | Freq: Once | INTRAVENOUS | Status: AC
  Administered 2016-06-22: 1000 mL via INTRAVENOUS

## 2016-06-22 MED ORDER — PREDNISOLONE ACETATE 1 % OP SUSP
1.0000 [drp] | Freq: Three times a day (TID) | OPHTHALMIC | Status: DC
  Filled 2016-06-22: qty 1

## 2016-06-22 MED ORDER — LORAZEPAM 1 MG PO TABS: 1.0000 mg | ORAL_TABLET | Freq: Four times a day (QID) | ORAL | Status: AC | PRN

## 2016-06-22 MED ORDER — TRAZODONE HCL 50 MG PO TABS
50.0000 mg | ORAL_TABLET | Freq: Every evening | ORAL | Status: DC | PRN
  Administered 2016-06-22 – 2016-06-24 (×3): 50 mg via ORAL
  Filled 2016-06-22 (×3): qty 1

## 2016-06-22 MED ORDER — VITAMIN B-1 100 MG PO TABS
100.0000 mg | ORAL_TABLET | Freq: Every day | ORAL | Status: DC
  Administered 2016-06-22 – 2016-06-25 (×4): 100 mg via ORAL
  Filled 2016-06-22 (×3): qty 1

## 2016-06-22 MED ORDER — THIAMINE HCL 100 MG/ML IJ SOLN: 100.0000 mg | Freq: Every day | INTRAMUSCULAR | Status: DC

## 2016-06-22 MED ORDER — PIPERACILLIN-TAZOBACTAM 3.375 G IVPB
3.3750 g | Freq: Three times a day (TID) | INTRAVENOUS | Status: DC
  Administered 2016-06-22 – 2016-06-25 (×9): 3.375 g via INTRAVENOUS
  Filled 2016-06-22 (×10): qty 50

## 2016-06-22 MED ORDER — IOPAMIDOL (ISOVUE-370) INJECTION 76%
INTRAVENOUS | Status: AC
  Administered 2016-06-22: 100 mL via INTRAVENOUS
  Filled 2016-06-22: qty 100

## 2016-06-22 MED ORDER — ONDANSETRON HCL 4 MG/2ML IJ SOLN
4.0000 mg | Freq: Once | INTRAMUSCULAR | Status: AC
  Administered 2016-06-22: 4 mg via INTRAVENOUS
  Filled 2016-06-22: qty 2

## 2016-06-22 MED ORDER — RIVAROXABAN 20 MG PO TABS
20.0000 mg | ORAL_TABLET | Freq: Every day | ORAL | Status: DC
  Administered 2016-06-22 – 2016-06-24 (×3): 20 mg via ORAL
  Filled 2016-06-22 (×3): qty 1

## 2016-06-22 MED ORDER — LORAZEPAM 2 MG/ML IJ SOLN: 1.0000 mg | Freq: Four times a day (QID) | INTRAMUSCULAR | Status: AC | PRN

## 2016-06-22 MED ORDER — TAMSULOSIN HCL 0.4 MG PO CAPS
0.4000 mg | ORAL_CAPSULE | Freq: Every day | ORAL | Status: DC
  Administered 2016-06-22 – 2016-06-25 (×4): 0.4 mg via ORAL
  Filled 2016-06-22 (×4): qty 1

## 2016-06-22 MED ORDER — VANCOMYCIN HCL 10 G IV SOLR
1250.0000 mg | Freq: Two times a day (BID) | INTRAVENOUS | Status: DC
  Administered 2016-06-22 – 2016-06-23 (×2): 1250 mg via INTRAVENOUS
  Filled 2016-06-22 (×3): qty 1250

## 2016-06-22 MED ORDER — POLYETHYLENE GLYCOL 3350 17 G PO PACK: 17.0000 g | PACK | Freq: Every day | ORAL | Status: DC | PRN

## 2016-06-22 MED ORDER — ACETAMINOPHEN 650 MG RE SUPP: 650.0000 mg | Freq: Four times a day (QID) | RECTAL | Status: DC | PRN

## 2016-06-22 NOTE — ED Triage Notes (Signed)
Pt transported from home by EMS with c/o lower mid back pain onset 0330 while sleeping. Pt has hx of AAA, per EMS pt appeared very "ashen", and tremulous.  pt is on xarelto for DVT's.  Pt denies abdominal pain, denies CP, denies SHOB. 128/90, CBG 154, 94% RA, 97.

## 2016-06-22 NOTE — ED Notes (Signed)
Taken to CT at this time. 

## 2016-06-22 NOTE — ED Notes (Signed)
Pt became hypotensive, diaphoretic, when sitting on side of bed.  Pt repositioned flat, new bolus given.

## 2016-06-22 NOTE — Progress Notes (Signed)
CRITICAL VALUE ALERT  Critical value received:  Lactic Acid 2.6  Date of notification:  06/22/16  Time of notification:  1615  Critical value read back:Yes.    Nurse who received alert:  Verlin Grills  MD notified (1st page):  Family Medicine Pager  Time of first page:  [phone]  Also notified MD of post void residual. RN bladder scanned 450-560 after pt voided 268ml.

## 2016-06-22 NOTE — Progress Notes (Signed)
Pharmacy Antibiotic Note  Ryan [name] is a 76 y.o. male admitted on 06/22/2016 with UTI.  Pharmacy has been consulted for Ceftriaxone dosing. WBC 3.7.   Plan: -Ceftriaxone 1g IV q24h -F/U urine culture for directed therapy  Height: 6\' 4"  (193 cm) Weight: 200 lb (90.7 kg) IBW/kg (Calculated) : 86.8  Temp (24hrs), Avg:102.7 F (39.3 C), Min:102.7 F (39.3 C), Max:102.7 F (39.3 C)   Recent Labs Lab 06/22/16 0457 06/22/16 0511 06/22/16 0512  WBC 3.7*  --   --   CREATININE  --   --  1.10  LATICACIDVEN  --  3.57*  --     Estimated Creatinine Clearance: 69 mL/min (by C-G formula based on SCr of 1.1 mg/dL).    Allergies  Allergen Reactions  . Simvastatin Itching    REACTION: Low level CK elevation  . Warfarin Sodium Rash    Narda Bonds 06/22/2016 5:58 AM

## 2016-06-22 NOTE — H&P (Signed)
[facility] Admission History and Physical Service Pager: [phone]  Patient name: Ryan [name] Medical record number: [id number] Date of birth: [birth year] Age: 76 y.o. Gender: male  Primary Care Provider: Zenia Resides, MD Consultants: None Code Status: Full  Chief Complaint: "shaking" and "Back pain"  Assessment and Plan: Ryan [name] is a 76 y.o. male presenting with shaking and back pain consistent with UTI and sepsis. PMH is significant for DVTs with lifelong anticoagulation, BPH, hyperlipidemia, insomnia, thoracic aortic aneurysm/abdominal aortic aneurysm, restless leg syndrome, polyarthralgia.  # Sepsis: WBC 3.7, tachypnea, temperature 102.27F. Likely secondary to urinary tract infection. Responded well to IV fluid. Orthostatic symptoms in the ED. Lactic acid WNL. VS abnormalities initially concerning for complications of AAA >> CTA noted minimal change in thoracic AA and AAA. I-STAT troponin negative. EKG showed old anteroseptal T-wave abnormality otherwise unremarkable. - Admit to stepdown under the care of family medicine teaching service; attending physician Dr. Ardelia Mems - S/P 4 L normal saline bolus in the ED - Ceftriaxone and azithromycin initiated in ED >> will continue - IVF 150 mL/h NS - Tylenol when necessary for fever - Blood cultures: Pending - Urine cultures: Pending  # UTI: Initial urinalysis revealed positive nitrite, moderate leukocyte, many bacteria. Patient has had UTIs in the past, most likely secondary to BPH. There is a high likelihood the patient's back pain and low abdominal pain could be related to this. Patient denied dysuria or hematuria on admission. - Urine culture: Pending - Post void residual: Pending - Continue IV fluids - Ceftriaxone as above  # AKI: Stage I. Patient has an increase in creatinine to 1.37 (baseline ~1.04). Likely prerenal due to dehydration. Patient endorsed increased alcohol use over the past  24 hours, without other fluid replacement. - IV fluids as above  - Follow-up with CMP   # BPH: Prior urinary tract infection as well as current UTI resulting in sepsis. Patient is on home Flomax. Denies dysuria but endorses weakened urinary stream.  - Continue Flomax at home dose  - I's & O's  # Concern for pneumonia: Some concern for pneumonia on CTA. Findings also consistent with atelectasis. Patient denies any recent cough or sputum production. No recent shortness of breath. CXR was unremarkable.  - Patient started on azithromycin in the ED; will continue azithromycin while febrile  - Strong consideration for discontinuing if patient continues to have no reports of cough/sputum production/SOB and once patient is afebrile. - Incentive spirometry PRN  # Concern for alcoholism: Patient endorsed regular alcohol use. 2-3 beers per day. Increased alcohol use over the past 24 hours. Prior episode of "dehydration" around Christmas time which patient reported was likely due to alcohol use as well.  - CIWA protocol - Last drink: 06/21/16 PM  # History of DVT: Patient has been on Xarelto daily for many years due to 2 separate episodes of unprovoked DVTs - Continue Xarelto 20 mg daily   # Thoracic aortic aneurysm/abdominal aortic aneurysm - CTA obtained in ED showed no acute changes; outpatient follow-up as appropriate  # Hyperlipidemia - Continue home statin   # Insomnia - Holding home Klonopin - Continue home trazodone   FEN/GI: Heart healthy diet; IVF 19ml/hr Prophylaxis: Full anticoagulation with Xarelto   Disposition: Home when medically stable  History of Present Illness:  Ryan [name] is a 76 y.o. male presenting with back pain and shaking. Patient states that he woke up this morning around 3 AM due to these symptoms and  he could not control them. He initially thought he was just dehydrated as he had had a significant amount of alcohol to drink the day prior. After a period of  time when his symptoms did not seem as though they could be controlled he and his wife decided to contact EMS, and he was brought to the hospital.  In the ED he was provided a significant amount of IV fluids. Vital signs and lab results indicated sepsis and UTI. Patient's back pain improved upon presentation to ED however he states that he had some new onset lower quadrant abdominal pain at that time. Patient states that after receiving "a few bags" of IV fluid he began to feel significantly better. Patient had one episode of presyncope after trying to stand once he began feeling better. Patient did not fall or lose consciousness, but felt weak and slightly lightheaded. Patient states that he was informed by the ED that he had "a UTI". He states that he has had this in the past but never this severe.  At no point during this period did patient experience any additional symptoms with the exception of some feelings of "sweatiness" and "feverishness". He denied any chest pain or shortness of breath. He denied any headache, vision changes, nausea, vomiting, or diarrhea. No weakness, numbness, or paresthesias. He denied any dysuria or hematuria. No penile discharge. No scrotal swelling or pain.  Review Of Systems: Per HPI  ROS  Patient Active Problem List   Diagnosis Date Noted  . Sepsis ([place]) 06/22/2016  . AAA (abdominal aortic aneurysm) without rupture ([place]) 06/11/2016  . Syncope 02/14/2016  . Thoracic aortic aneurysm without rupture ([facility]) 02/14/2016  . Insomnia 02/13/2016  . Rotator cuff syndrome of right shoulder 10/18/2015  . Polyarthralgia 10/17/2015  . Infection of urinary tract 08/07/2015  . Presbycusis 07/16/2015  . Headache 11/14/2014  . Routine general medical examination at a health care facility 06/23/2012  . Metatarsalgia 05/06/2011  . Tear of meniscus of left knee 09/16/2010  . Benign prostatic hyperplasia 06/05/2010  . Bradycardia 07/05/2009  . Malaise and fatigue 07/05/2009  .  COLONIC POLYPS 04/06/2008  . DEGENERATIVE JOINT DISEASE, AXIAL SKELETON 06/10/2006  . Pure hypercholesterolemia 04/08/2006  . RESTLESS LEGS SYNDROME 04/08/2006  . History of DVT (deep vein thrombosis) 04/08/2006  . VENOUS INSUFFICIENCY, CHRONIC 04/08/2006  . BACK PAIN, LOW 04/08/2006    Past Medical History: Past Medical History:  Diagnosis Date  . Arthritis   . BPH (benign prostatic hyperplasia)   . DVT (deep venous thrombosis) ([place])    x2 "Xarelto use"  . Hyperlipidemia     Past Surgical History: Past Surgical History:  Procedure Laterality Date  . APPENDECTOMY    . CATARACT EXTRACTION Right   . COLONOSCOPY W/ POLYPECTOMY     multiple  . COLONOSCOPY WITH PROPOFOL N/A 10/30/2014   Procedure: COLONOSCOPY WITH PROPOFOL;  Surgeon: Garlan Fair, MD;  Location: [facility] ENDOSCOPY;  Service: Endoscopy;  Laterality: N/A;  . EYE SURGERY     retinal repair bilateral  . HERNIA REPAIR     BIH repair child  . KNEE ARTHROSCOPY Left   . SEPTOPLASTY    . TONSILLECTOMY      Social History: Social History  Substance Use Topics  . Smoking status: Former Smoker    Quit date: 02/10/1980  . Smokeless tobacco: Never Used  . Alcohol use 7.0 oz/week    14 Standard drinks or equivalent per week     Comment:  daily   Additional  social history: none  Please also refer to relevant sections of EMR.  Family History: Family History  Problem Relation Age of Onset  . Alzheimer's disease Mother   . Arthritis Mother   . Arthritis Father   . Cancer Sister        breast  . Cancer Brother        melanoma  . Stroke Paternal Grandfather    Allergies and Medications: Allergies  Allergen Reactions  . Simvastatin Itching    REACTION: Low level CK elevation  . Warfarin Sodium Rash   No current facility-administered medications on file prior to encounter.    Current Outpatient Prescriptions on File Prior to Encounter  Medication Sig Dispense Refill  . Glucosamine-Chondroit-Vit C-Mn  (GLUCOSAMINE-CHONDROITIN) TABS Take 1 tablet by mouth daily.    . Multiple Vitamin (MULTIVITAMIN WITH MINERALS) TABS tablet Take 1 tablet by mouth daily.    . tamsulosin (FLOMAX) 0.4 MG CAPS capsule TAKE (1) CAPSULE DAILY. 90 capsule 3  . XARELTO 20 MG TABS tablet TAKE 1 TABLET ONCE DAILY WITH SUPPER. 90 tablet 3  . clonazePAM (KLONOPIN) 1 MG tablet Take 1 tablet (1 mg total) by mouth at bedtime as needed. for sleep (Patient not taking: Reported on 06/22/2016) 90 tablet 1    Objective: BP 98/63   Pulse (!) 50   Temp (!) 102.7 F (39.3 C) (Rectal)   Resp (!) 21   Ht 6\' 4"  (1.93 m)   Wt 200 lb (90.7 kg)   SpO2 96%   BMI 24.34 kg/m  Exam: General -- oriented x3, pleasant and cooperative. Flushed but well appearing overall. HEENT -- Head is normocephalic. PERRLA. EOMI. Ears, nose and throat were benign. Neck -- supple; no bruits. Integument -- intact. No rash, erythema, or ecchymoses.  Chest -- good expansion. Lungs clear to auscultation. Cardiac -- RRR. No murmurs noted.  Abdomen -- soft, slight tenderness to LLQ/RLQ with deep palpation. No masses palpable. Bowel sounds present. CVA tenderness noted bilaterally CNS -- cranial nerves II through XII grossly intact. 2+ reflexes bilaterally. Extremeties - no tenderness or effusions noted. ROM good. 5/5 bilateral strength. Dorsalis pedis pulses present and symmetrical.    Labs and Imaging: CBC BMET   Recent Labs Lab 06/22/16 0457 06/22/16 0512  WBC 3.7*  --   HGB 15.8 15.6  HCT 46.9 46.0  PLT 154  --     Recent Labs Lab 06/22/16 0457 06/22/16 0512  NA 137 138  K 3.9 3.9  CL 105 107  CO2 19*  --   BUN 24* 26*  CREATININE 1.37* 1.10  GLUCOSE 135* 134*  CALCIUM 8.8*  --      CTA chest/abdomen/pelvis (5/14) IMPRESSION: CT angiogram chest: Ascending thoracic aortic diameter measures 4.7 x 4.5 cm, unchanged from recent study. No thoracic aortic dissection evident. Ascending thoracic aortic aneurysm. Recommend  semi-annual imaging followup by CTA or MRA and referral to cardiothoracic surgery if not already obtained. This recommendation follows 2010 ACCF/AHA/AATS/ACR/ASA/SCA/SCAI/SIR/STS/SVM Guidelines for the Diagnosis and Management of Patients With Thoracic Aortic Disease. Circulation. 2010; 121: N277-O242.  Scattered areas of atherosclerotic calcification including foci of coronary artery calcification.  Bibasilar atelectasis with questionable early pneumonia posterior segment right lower lobe.  No adenopathy.  Small hiatal hernia.  CT angiogram abdomen and pelvis: Distal abdominal aortic aneurysm which arises just inferior to the inferior mesenteric artery and terminates above the bifurcation. Aneurysm measures 3.5 x 3.2 cm in diameter and extends over just under 5 cm of length. Recommend followup by  ultrasound in 2 years.    Ryan [name], Ryan Pearson, MD 06/22/2016, 11:29 AM PGY-3, [place] Intern pager: [phone], text pages welcome

## 2016-06-22 NOTE — ED Provider Notes (Addendum)
[facility] DEPT Provider Note   CSN: [id number] Arrival date & time: 06/22/16  0439     History   Chief Complaint Chief Complaint  Patient presents with  . Back Pain    HPI Ryan [name] is a 76 y.o. male.  Patient presents to the emergency department for evaluation of abdominal pain. Patient reports that he was awakened at 2 AM with pain in the center of his abdomen. He was able to go back to sleep, but woke up an hour later with worse pain. Patient comes to the ER by EMS. He reports that his pain resolved just prior to arrival at the ER. Patient has a history of AAA as well as DVT, currently on Xarelto. He has not had any chest pain or shortness of breath.      Past Medical History:  Diagnosis Date  . Arthritis   . BPH (benign prostatic hyperplasia)   . DVT (deep venous thrombosis) ([place])    x2 "Xarelto use"  . Hyperlipidemia     Patient Active Problem List   Diagnosis Date Noted  . AAA (abdominal aortic aneurysm) without rupture ([place]) 06/11/2016  . Syncope 02/14/2016  . Thoracic aortic aneurysm without rupture ([place]) 02/14/2016  . Insomnia 02/13/2016  . Rotator cuff syndrome of right shoulder 10/18/2015  . Polyarthralgia 10/17/2015  . Infection of urinary tract 08/07/2015  . Presbycusis 07/16/2015  . Headache 11/14/2014  . Routine general medical examination at a health care facility 06/23/2012  . Metatarsalgia 05/06/2011  . Tear of meniscus of left knee 09/16/2010  . Benign prostatic hyperplasia 06/05/2010  . Bradycardia 07/05/2009  . Malaise and fatigue 07/05/2009  . COLONIC POLYPS 04/06/2008  . DEGENERATIVE JOINT DISEASE, AXIAL SKELETON 06/10/2006  . Pure hypercholesterolemia 04/08/2006  . RESTLESS LEGS SYNDROME 04/08/2006  . History of DVT (deep vein thrombosis) 04/08/2006  . VENOUS INSUFFICIENCY, CHRONIC 04/08/2006  . BACK PAIN, LOW 04/08/2006    Past Surgical History:  Procedure Laterality Date  . APPENDECTOMY    . CATARACT EXTRACTION Right     . COLONOSCOPY W/ POLYPECTOMY     multiple  . COLONOSCOPY WITH PROPOFOL N/A 10/30/2014   Procedure: COLONOSCOPY WITH PROPOFOL;  Surgeon: Garlan Fair, MD;  Location: [facility] ENDOSCOPY;  Service: Endoscopy;  Laterality: N/A;  . EYE SURGERY     retinal repair bilateral  . HERNIA REPAIR     BIH repair child  . KNEE ARTHROSCOPY Left   . SEPTOPLASTY    . TONSILLECTOMY         Home Medications    Prior to Admission medications   Medication Sig Start Date End Date Taking? Authorizing Provider  clonazePAM (KLONOPIN) 1 MG tablet Take 1 tablet (1 mg total) by mouth at bedtime as needed. for sleep 03/02/16   Zenia Resides, MD  Glucosamine-Chondroit-Vit C-Mn (GLUCOSAMINE-CHONDROITIN) TABS Take 1 tablet by mouth daily.    [provider]  Multiple Vitamin (MULTIVITAMIN WITH MINERALS) TABS tablet Take 1 tablet by mouth daily.    [provider]  tamsulosin (FLOMAX) 0.4 MG CAPS capsule TAKE (1) CAPSULE DAILY. 12/17/15   Zenia Resides, MD  XARELTO 20 MG TABS tablet TAKE 1 TABLET ONCE DAILY WITH SUPPER. 05/18/16   Zenia Resides, MD    Family History Family History  Problem Relation Age of Onset  . Alzheimer's disease Mother   . Arthritis Mother   . Arthritis Father   . Cancer Sister        breast  . Cancer  Brother        melanoma  . Stroke Paternal Grandfather     Social History Social History  Substance Use Topics  . Smoking status: Former Smoker    Quit date: 02/10/1980  . Smokeless tobacco: Never Used  . Alcohol use 7.0 oz/week    14 Standard drinks or equivalent per week     Comment:  daily     Allergies   Simvastatin and Warfarin sodium   Review of Systems Review of Systems  Respiratory: Negative for shortness of breath.   Cardiovascular: Negative for chest pain.  Gastrointestinal: Positive for abdominal pain.  All other systems reviewed and are negative.    Physical Exam Updated Vital Signs BP 104/62   Pulse 72   Temp (!) 102.7 F (39.3  C) (Rectal)   Resp (!) 25   Ht 6\' 4"  (1.93 m)   Wt 200 lb (90.7 kg)   SpO2 91%   BMI 24.34 kg/m   Physical Exam  Constitutional: He is oriented to person, place, and time. He appears well-developed and well-nourished. No distress.  HENT:  Head: Normocephalic and atraumatic.  Right Ear: Hearing normal.  Left Ear: Hearing normal.  Nose: Nose normal.  Mouth/Throat: Oropharynx is clear and moist and mucous membranes are normal.  Eyes: Conjunctivae and EOM are normal. Pupils are equal, round, and reactive to light.  Neck: Normal range of motion. Neck supple.  Cardiovascular: Regular rhythm, S1 normal and S2 normal.  Exam reveals no gallop and no friction rub.   No murmur heard. Pulmonary/Chest: Effort normal and breath sounds normal. No respiratory distress. He exhibits no tenderness.  Abdominal: Soft. Normal appearance and bowel sounds are normal. There is no hepatosplenomegaly. There is tenderness in the epigastric area. There is no rebound, no guarding, no tenderness at McBurney's point and negative Murphy's sign. No hernia.  Musculoskeletal: Normal range of motion.  Neurological: He is alert and oriented to person, place, and time. He has normal strength. No cranial nerve deficit or sensory deficit. Coordination normal. GCS eye subscore is 4. GCS verbal subscore is 5. GCS motor subscore is 6.  Skin: Skin is warm, dry and intact. No rash noted. No cyanosis.  Psychiatric: He has a normal mood and affect. His speech is normal and behavior is normal. Thought content normal.  Nursing note and vitals reviewed.    ED Treatments / Results  Labs (all labs ordered are listed, but only abnormal results are displayed) Labs Reviewed  CBC WITH DIFFERENTIAL/PLATELET - Abnormal; Notable for the following:       Result Value   WBC 3.7 (*)    Lymphs Abs 0.2 (*)    All other components within normal limits  COMPREHENSIVE METABOLIC PANEL - Abnormal; Notable for the following:    CO2 19 (*)     Glucose, Bld 135 (*)    BUN 24 (*)    Creatinine, Ser 1.37 (*)    Calcium 8.8 (*)    Total Protein 6.2 (*)    AST 57 (*)    Total Bilirubin 1.3 (*)    GFR calc non Af Amer 48 (*)    GFR calc Af Amer 56 (*)    All other components within normal limits  URINALYSIS, ROUTINE W REFLEX MICROSCOPIC - Abnormal; Notable for the following:    APPearance HAZY (*)    Nitrite POSITIVE (*)    Leukocytes, UA MODERATE (*)    Bacteria, UA MANY (*)    All other components within  normal limits  I-STAT CG4 LACTIC ACID, ED - Abnormal; Notable for the following:    Lactic Acid, Venous 3.57 (*)    All other components within normal limits  I-STAT CHEM 8, ED - Abnormal; Notable for the following:    BUN 26 (*)    Glucose, Bld 134 (*)    Calcium, Ion 1.09 (*)    All other components within normal limits  CULTURE, BLOOD (ROUTINE X 2)  CULTURE, BLOOD (ROUTINE X 2)  URINE CULTURE  LIPASE, BLOOD  LACTIC ACID, PLASMA  LACTIC ACID, PLASMA  I-STAT TROPOININ, ED    EKG  EKG Interpretation  Date/Time:  Monday Jun 22 2016 04:44:10 EDT Ventricular Rate:  88 PR Interval:    QRS Duration: 88 QT Interval:  351 QTC Calculation: 425 R Axis:   27 Text Interpretation:  Sinus rhythm Anteroseptal infarct, age indeterminate Confirmed by POLLINA  MD, CHRISTOPHER [phone]) on 06/22/2016 4:51:40 AM       Radiology Dg Chest Port 1 View  Result Date: 06/22/2016 CLINICAL DATA:  Fever today. EXAM: PORTABLE CHEST 1 VIEW COMPARISON:  Chest CT 06/11/2016, radiographs 02/04/2016 FINDINGS: Unchanged heart size and mediastinal contours. Atherosclerosis of the aortic arch. Lower lung volumes from prior exam. No pulmonary edema, consolidation, pleural fluid or pneumothorax. Grossly stable osseous structures. IMPRESSION: No acute abnormality.  No evidence of pneumonia. Electronically Signed   By: Jeb Levering M.D.   On: 06/22/2016 06:02   Ct Angio Chest/abd/pel For Dissection W And/or Wo Contrast  Result Date:  06/22/2016 CLINICAL DATA:  Persistent chest and abdomen pain with extension to back EXAM: CT ANGIOGRAPHY CHEST, ABDOMEN AND PELVIS TECHNIQUE: Initially, axial CT images were obtained through the chest without intravenous contrast maternal administration. Multidetector CT imaging through the chest, abdomen and pelvis was performed using the standard protocol during bolus administration of intravenous contrast. Multiplanar reconstructed images and MIPs were obtained and reviewed to evaluate the vascular anatomy. CONTRAST:  100 mL Isovue 370 nonionic COMPARISON:  Chest CT angiogram Jun 11, 2016 ; chest radiograph Jun 22, 2016 FINDINGS: CTA CHEST FINDINGS Cardiovascular: No aortic hematoma seen on the precontrast study. The ascending thoracic aorta has a diameter measured at 4.7 x 4.5 cm, unchanged from recent study. There is no demonstrable thoracic aortic dissection. Visualized great vessels appear unremarkable. Scattered foci of atherosclerotic calcification are noted in the aorta as well as scattered foci of coronary artery calcification. Heart is upper normal in size. There is no pericardial effusion evident. Mediastinum/Nodes: No thyroid lesions are evident. There is no appreciable thoracic adenopathy. There is a focal hiatal hernia. Lungs/Pleura: There is patchy bibasilar atelectatic change. There is equivocal early consolidation in the posterior segment of the right lower lobe. Lungs elsewhere clear. No pleural effusion or pleural thickening evident. Musculoskeletal: There is degenerative change in thoracic spine. There are no evident blastic or lytic bone lesions. Review of the MIP images confirms the above findings. CTA ABDOMEN AND PELVIS FINDINGS VASCULAR Aorta: There is focal aneurysmal dilatation in the distal aorta inferior to the renal arteries which terminates above the bifurcation. This aneurysm extends over a distance superior inferior of just under 5 cm. This aneurysm has a maximum transverse diameter  of 3.5 x 3.2 cm. There is no abdominal aortic dissection. There is calcification in the abdominal aorta without hemodynamically significant obstruction. There is no periaortic fluid. Celiac: There is mild calcification at the origin of the celiac artery without hemodynamically significant obstruction. No aneurysm or dissection of this vessel or its  major branches. Major branches appear patent. SMA: There is calcification at the origin of the superior mesenteric artery without hemodynamically significant obstruction. No aneurysm or dissection of this vessel or its major branches. Major branches patent. Renals: Single renal artery is noted on each side. There is relatively mild atherosclerotic calcification at the origin of each renal artery with less than 50% diameter stenosis in these areas. There is no aneurysm or dissection in the renal arteries or its branches. No fibromuscular dysplasia evident. Major renal artery branches appear patent. IMA: Inferior mesenteric artery is diminutive but patent. No aneurysm or dissection. The abdominal aortic aneurysm arises immediately inferior to the inferior mesenteric artery. Inflow: There is moderate calcification in the proximal common iliac arteries, somewhat more on the right than on the left without hemodynamically significant obstruction penis moderate calcification each hypogastric artery. There is calcification in both common femoral arteries with slightly less than 50% diameter stenosis in the proximal common femoral arteries bilaterally. Mild calcification is noted in the proximal superficial femoral arteries without hemodynamically significant obstruction. No aneurysm or dissection. Veins: No obvious venous abnormality within the limitations of this arterial phase study. Review of the MIP images confirms the above findings. NON-VASCULAR Hepatobiliary: Liver measures 22.0 cm in length. No focal liver lesions are evident. Gallbladder wall is not appreciably  thickened. There is no biliary duct dilatation. Pancreas: No pancreatic mass or inflammatory focus. Spleen: No splenic lesions are evident. Adrenals/Urinary Tract: Adrenals appear normal bilaterally. Kidneys bilaterally show no evident mass or hydronephrosis on either side. There is an extrarenal pelvis on each side, an anatomic variant. There is no renal or ureteral calculus on either side. Urinary bladder is midline without wall thickening. Stomach/Bowel: There is no bowel wall or mesenteric thickening. No bowel obstruction. No free air or portal venous air. Lymphatic: There is no appreciable adenopathy in the abdomen or pelvis. Reproductive: Prostate is mildly prominent with seminal vesicles normal in size and contour. No pelvic mass evident. Other: Appendix absent. No ascites or abscess in the abdomen or pelvis. There is a small ventral hernia containing only fat. There is fat in each inguinal ring. Musculoskeletal: There is degenerative change in the lumbar spine. There are no blastic or lytic bone lesions. There is no intramuscular or abdominal wall lesion. Review of the MIP images confirms the above findings. IMPRESSION: CT angiogram chest: Ascending thoracic aortic diameter measures 4.7 x 4.5 cm, unchanged from recent study. No thoracic aortic dissection evident. Ascending thoracic aortic aneurysm. Recommend semi-annual imaging followup by CTA or MRA and referral to cardiothoracic surgery if not already obtained. This recommendation follows 2010 ACCF/AHA/AATS/ACR/ASA/SCA/SCAI/SIR/STS/SVM Guidelines for the Diagnosis and Management of Patients With Thoracic Aortic Disease. Circulation. 2010; 121: Z610-R604. Scattered areas of atherosclerotic calcification including foci of coronary artery calcification. Bibasilar atelectasis with questionable early pneumonia posterior segment right lower lobe. No adenopathy. Small hiatal hernia. CT angiogram abdomen and pelvis: Distal abdominal aortic aneurysm which arises  just inferior to the inferior mesenteric artery and terminates above the bifurcation. Aneurysm measures 3.5 x 3.2 cm in diameter and extends over just under 5 cm of length. Recommend followup by ultrasound in 2 years. This recommendation follows ACR consensus guidelines: White Paper of the ACR Incidental Findings Committee II on Vascular Findings. J Am Coll Radiol 2013; 10:789-794. Areas of atherosclerotic calcification at multiple sites without hemodynamically significant obstruction. No aneurysm or dissection noted in major pelvic or mesenteric arterial vessels. Prominent liver without focal lesion. No bowel obstruction.  No abscess.  Appendix absent.  Prostate prominent; advise correlation with PSA. Small ventral hernia.  Fat noted in each inguinal ring.  1 Electronically Signed   By: Lowella Grip III M.D.   On: 06/22/2016 07:45    Procedures Procedures (including critical care time)  Medications Ordered in ED Medications  sodium chloride 0.9 % bolus 1,000 mL (1,000 mLs Intravenous Not Given 06/22/16 0611)    And  sodium chloride 0.9 % bolus 1,000 mL (1,000 mLs Intravenous New Bag/Given 06/22/16 0608)    And  sodium chloride 0.9 % bolus 1,000 mL (1,000 mLs Intravenous New Bag/Given 06/22/16 0650)  cefTRIAXone (ROCEPHIN) 1 g in dextrose 5 % 50 mL IVPB (not administered)  azithromycin (ZITHROMAX) 500 mg in dextrose 5 % 250 mL IVPB (not administered)  ondansetron (ZOFRAN) injection 4 mg (4 mg Intravenous Given 06/22/16 0453)  sodium chloride 0.9 % bolus 1,000 mL (0 mLs Intravenous Stopped 06/22/16 0610)  cefTRIAXone (ROCEPHIN) 2 g in dextrose 5 % 50 mL IVPB (0 g Intravenous Stopped 06/22/16 0640)  iopamidol (ISOVUE-370) 76 % injection (100 mLs Intravenous Contrast Given 06/22/16 0659)     Initial Impression / Assessment and Plan / ED Course  I have reviewed the triage vital signs and the nursing notes.  Pertinent labs & imaging results that were available during my care of the patient were  reviewed by me and considered in my medical decision making (see chart for details).     Patient presents to the emergency department with complaints of back pain followed by abdominal pain. Patient reportedly started to have pain in the center of his back that awakened him and kept him up. Pain then moved to his epigastric area and was persistent. Pain became severe. EMS reported the patient was in distress upon their arrival, but by the time he came to the ER his pain had resolved. Patient was then found to be febrile and hypotensive. He was initiated on treatment for sepsis, origin in the urine. Patient does have thoracic and abdominal aortic aneurysm. He underwent CT angiography of chest, abdomen and pelvis to further evaluate. No evidence of dissection or rupture. There is evidence of pneumonia on CT that was not seen on x-ray. Zithromax added to the Rocephin already ordered. Patient will be admitted for further management.  CRITICAL CARE Performed by: Orpah Greek   Total critical care time: 30 minutes  Critical care time was exclusive of separately billable procedures and treating other patients.  Critical care was necessary to treat or prevent imminent or life-threatening deterioration.  Critical care was time spent personally by me on the following activities: development of treatment plan with patient and/or surrogate as well as nursing, discussions with consultants, evaluation of patient's response to treatment, examination of patient, obtaining history from patient or surrogate, ordering and performing treatments and interventions, ordering and review of laboratory studies, ordering and review of radiographic studies, pulse oximetry and re-evaluation of patient's condition.   Final Clinical Impressions(s) / ED Diagnoses   Final diagnoses:  Sepsis, due to unspecified organism [facility])  Urinary tract infection without hematuria, site unspecified  Community acquired pneumonia  of right lower lobe of lung [facility])    New Prescriptions New Prescriptions   No medications on file     Orpah Greek, MD 06/22/16 5956    Orpah Greek, MD 06/22/16 [phone]

## 2016-06-22 NOTE — Progress Notes (Signed)
Pharmacy Antibiotic Note  Ryan [name] is a 76 y.o. male admitted on 06/22/2016 with sepsis.  Pharmacy has been consulted for vancomycin and zosyn dosing.  Previously on ceftriaxone and azithromycin.  Cultures pending.  Plan: 1. Vancomycin 1250 mg IV q 12 hrs. 2. Zosyn 3.375g IV q 8 hrs. 3. F/u renal function, cultures and clinical course. 4. Narrow antibiotics as able.  Height: 6\' 4"  (193 cm) Weight: 200 lb (90.7 kg) IBW/kg (Calculated) : 86.8  Temp (24hrs), Avg:102.7 F (39.3 C), Min:102.7 F (39.3 C), Max:102.7 F (39.3 C)   Recent Labs Lab 06/22/16 0457 06/22/16 0511 06/22/16 0512 06/22/16 0815  WBC 3.7*  --   --   --   CREATININE 1.37*  --  1.10  --   LATICACIDVEN  --  3.57*  --  1.3    Estimated Creatinine Clearance: 69 mL/min (by C-G formula based on SCr of 1.1 mg/dL).    Allergies  Allergen Reactions  . Simvastatin Itching    REACTION: Low level CK elevation  . Warfarin Sodium Rash    Antimicrobials this admission:  Azith 5/14 x 1  Ceftriaxone 5/14 x 1 Vanc 5/14>> ** Zosyn 5/14>> **  Dose adjustments this admission:    Microbiology results:  5/14 BCx x 2: 5/14 UCx:  Sputum: *  5/14 MRSA PCR: neg   Thank you for allowing pharmacy to be a part of this patient's care.  Uvaldo Rising, BCPS  Clinical Pharmacist Pager [phone]  06/22/2016 2:49 PM

## 2016-06-23 ENCOUNTER — Encounter (HOSPITAL_COMMUNITY): Payer: Self-pay | Admitting: *Deleted

## 2016-06-23 LAB — BLOOD CULTURE ID PANEL (REFLEXED)
Acinetobacter baumannii: NOT DETECTED
Acinetobacter baumannii: NOT DETECTED
CANDIDA GLABRATA: NOT DETECTED
CANDIDA KRUSEI: NOT DETECTED
CARBAPENEM RESISTANCE: NOT DETECTED
CARBAPENEM RESISTANCE: NOT DETECTED
Candida albicans: NOT DETECTED
Candida albicans: NOT DETECTED
Candida glabrata: NOT DETECTED
Candida krusei: NOT DETECTED
Candida parapsilosis: NOT DETECTED
Candida parapsilosis: NOT DETECTED
Candida tropicalis: NOT DETECTED
Candida tropicalis: NOT DETECTED
ENTEROBACTERIACEAE SPECIES: DETECTED — AB
ENTEROCOCCUS SPECIES: NOT DETECTED
Enterobacter cloacae complex: NOT DETECTED
Enterobacter cloacae complex: NOT DETECTED
Enterobacteriaceae species: NOT DETECTED
Enterococcus species: NOT DETECTED
Escherichia coli: DETECTED — AB
Escherichia coli: NOT DETECTED
HAEMOPHILUS INFLUENZAE: NOT DETECTED
Haemophilus influenzae: NOT DETECTED
KLEBSIELLA OXYTOCA: NOT DETECTED
KLEBSIELLA OXYTOCA: NOT DETECTED
KLEBSIELLA PNEUMONIAE: NOT DETECTED
Klebsiella pneumoniae: NOT DETECTED
LISTERIA MONOCYTOGENES: NOT DETECTED
Listeria monocytogenes: NOT DETECTED
Neisseria meningitidis: NOT DETECTED
Neisseria meningitidis: NOT DETECTED
PROTEUS SPECIES: NOT DETECTED
PSEUDOMONAS AERUGINOSA: DETECTED — AB
PSEUDOMONAS AERUGINOSA: NOT DETECTED
Proteus species: NOT DETECTED
SERRATIA MARCESCENS: NOT DETECTED
SERRATIA MARCESCENS: NOT DETECTED
STAPHYLOCOCCUS AUREUS BCID: NOT DETECTED
STAPHYLOCOCCUS AUREUS BCID: NOT DETECTED
STAPHYLOCOCCUS SPECIES: NOT DETECTED
STAPHYLOCOCCUS SPECIES: NOT DETECTED
STREPTOCOCCUS AGALACTIAE: NOT DETECTED
STREPTOCOCCUS AGALACTIAE: NOT DETECTED
STREPTOCOCCUS PNEUMONIAE: NOT DETECTED
STREPTOCOCCUS PYOGENES: NOT DETECTED
STREPTOCOCCUS PYOGENES: NOT DETECTED
STREPTOCOCCUS SPECIES: NOT DETECTED
Streptococcus pneumoniae: NOT DETECTED
Streptococcus species: NOT DETECTED

## 2016-06-23 LAB — COMPREHENSIVE METABOLIC PANEL
ALBUMIN: 3.1 g/dL — AB (ref 3.5–5.0)
ALT: 33 U/L (ref 17–63)
ANION GAP: 8 (ref 5–15)
AST: 36 U/L (ref 15–41)
Alkaline Phosphatase: 47 U/L (ref 38–126)
BUN: 14 mg/dL (ref 6–20)
CO2: 20 mmol/L — AB (ref 22–32)
Calcium: 8.3 mg/dL — ABNORMAL LOW (ref 8.9–10.3)
Chloride: 109 mmol/L (ref 101–111)
Creatinine, Ser: 1.2 mg/dL (ref 0.61–1.24)
GFR calc Af Amer: >60 mL/min (ref >60)
GFR calc non Af Amer: 57 mL/min — ABNORMAL LOW (ref >60)
GLUCOSE: 121 mg/dL — AB (ref 65–99)
POTASSIUM: 4 mmol/L (ref 3.5–5.1)
SODIUM: 137 mmol/L (ref 135–145)
Total Bilirubin: 1.1 mg/dL (ref 0.3–1.2)
Total Protein: 5.6 g/dL — ABNORMAL LOW (ref 6.5–8.1)

## 2016-06-23 LAB — CBC
HEMATOCRIT: 44.1 % (ref 39.0–52.0)
HEMOGLOBIN: 14.5 g/dL (ref 13.0–17.0)
MCH: 31.4 pg (ref 26.0–34.0)
MCHC: 32.9 g/dL (ref 30.0–36.0)
MCV: 95.5 fL (ref 78.0–100.0)
Platelets: 131 10*3/uL — ABNORMAL LOW (ref 150–400)
RBC: 4.62 MIL/uL (ref 4.22–5.81)
RDW: 13.5 % (ref 11.5–15.5)
WBC: 7.3 10*3/uL (ref 4.0–10.5)

## 2016-06-23 LAB — BASIC METABOLIC PANEL
Anion gap: 8 (ref 5–15)
BUN: 11 mg/dL (ref 6–20)
CALCIUM: 8.9 mg/dL (ref 8.9–10.3)
CHLORIDE: 108 mmol/L (ref 101–111)
CO2: 22 mmol/L (ref 22–32)
CREATININE: 1.06 mg/dL (ref 0.61–1.24)
GFR calc Af Amer: >60 mL/min (ref >60)
GFR calc non Af Amer: >60 mL/min (ref >60)
Glucose, Bld: 95 mg/dL (ref 65–99)
Potassium: 4 mmol/L (ref 3.5–5.1)
Sodium: 138 mmol/L (ref 135–145)

## 2016-06-23 NOTE — Discharge Summary (Signed)
[facility] Discharge Summary  Patient name: Ryan [name] Medical record number: [id number] Date of birth: 1940-11-20 Age: 76 y.o. Gender: male Date of Admission: 06/22/2016  Date of Discharge: 06/25/16 Admitting Physician: Leeanne Rio, MD  Primary Care Provider: Zenia Resides, MD Consultants: none  Indication for Hospitalization: sepsis  Discharge Diagnoses/Problem List:  UTI AKI, resolved BPH History of DVTs Thoracic aortic aneurysm/abdominal aortic aneurysm HLD Insomnia  Disposition: home  Discharge Condition: improved  Discharge Exam: see progress note from day of discharge  Brief Hospital Course:  Patient presented with rigors and back pain, vitals suggestive of sepsis with tachypnea, fever. Urine suggestive of UTI. In the emergency room patient was resuscitated with IV fluids, which significantly improved vital signs. Lactic acid initially elevated, responded well to fluids and normalized. Creatinine was elevated on initial presentation, likely due to urinary retention as well as prerenal etiology. One post void residual was about 500 mL, however repeats were normal. Patient was maintained on his home Flomax throughout admission. Suspect BPH had some role in urinary retention causing this UTI. Initially started on ceftriaxone and azithromycin by the emergency department for possible pneumonia, but was changed to vancomycin and Zosyn by admitting team due to persistent hypotension. Urine and blood cultures resulted with Escherichia coli. Vancomycin was discontinued. Subsequently, blood cultures resulted with Pseudomonas. Patient was maintained on Zosyn until sensitivities resulted for both Pseudomonas and Escherichia coli. Both were sensitive to Cipro. Patient was afebrile and well-appearing for multiple days prior to discharge, and was discharged with a total of 10 day course of antibiotics with close follow-up.  Issues for Follow Up:   1. Please recheck pending repeat blood cultures to ensure they are negative.  2. Patient has not been taking his Lipitor - he can't recall why it was stopped. Allergy lists low level CK elevation. If statin is indicated, may want to discuss this with the patient.  3. Initial post void residual elevated, but patient able to void on his own afterwards. Continued home flomax. Urology referral may be warranted if BPH is not well controlled on Flomax.  4. May consider rechecking BMP. Cr appropriately trending down to 1.06 on day prior to discharge from initial value of 1.36 Baseline is 1.04.  Significant Procedures: none  Significant Labs and Imaging:   Recent Labs Lab 06/22/16 0457 06/22/16 0512 06/23/16 0535 06/24/16 0822  WBC 3.7*  --  7.3 4.7  HGB 15.8 15.6 14.5 15.0  HCT 46.9 46.0 44.1 43.3  PLT 154  --  131* 121*    Recent Labs Lab 06/22/16 0457 06/22/16 0512 06/23/16 0535 06/23/16 1644  NA 137 138 137 138  K 3.9 3.9 4.0 4.0  CL 105 107 109 108  CO2 19*  --  20* 22  GLUCOSE 135* 134* 121* 95  BUN 24* 26* 14 11  CREATININE 1.37* 1.10 1.20 1.06  CALCIUM 8.8*  --  8.3* 8.9  ALKPHOS 62  --  47  --   AST 57*  --  36  --   ALT 32  --  33  --   ALBUMIN 3.7  --  3.1*  --     Results/Tests Pending at Time of Discharge: repeat blood cultures  Discharge Medications:  Allergies as of 06/25/2016      Reactions   Simvastatin Itching   REACTION: Low level CK elevation   Warfarin Sodium Rash      Medication List    STOP taking these medications  atorvastatin 10 MG tablet Commonly known as:  LIPITOR   clonazePAM 1 MG tablet Commonly known as:  KLONOPIN   prednisoLONE acetate 1 % ophthalmic suspension Commonly known as:  PRED FORTE     TAKE these medications   acetaminophen 325 MG tablet Commonly known as:  TYLENOL Take 650 mg by mouth every 6 (six) hours as needed for mild pain.   ciprofloxacin 500 MG tablet Commonly known as:  CIPRO Take 1 tablet (500 mg  total) by mouth 2 (two) times daily.   Glucosamine-Chondroitin Tabs Take 1 tablet by mouth daily.   multivitamin with minerals Tabs tablet Take 1 tablet by mouth daily.   tamsulosin 0.4 MG Caps capsule Commonly known as:  FLOMAX TAKE (1) CAPSULE DAILY.   traZODone 50 MG tablet Commonly known as:  DESYREL Take 50 mg by mouth at bedtime as needed for sleep.   XARELTO 20 MG Tabs tablet Generic drug:  rivaroxaban TAKE 1 TABLET ONCE DAILY WITH SUPPER.       Discharge Instructions: Please refer to Patient Instructions section of EMR for full details.  Patient was counseled important signs and symptoms that should prompt return to medical care, changes in medications, dietary instructions, activity restrictions, and follow up appointments.   Follow-Up Appointments: Follow-up Information    Hensel, Jamal Collin, MD. Go on 07/01/2016.   Specialty:  Family Medicine Why:  3pm hospital follow up with Dr. Huntley Dec information: [place] [address] [phone]           Sela Hilding, MD 06/25/2016, 11:52 AM PGY-1, [place]

## 2016-06-23 NOTE — Progress Notes (Signed)
PHARMACY - PHYSICIAN COMMUNICATION CRITICAL VALUE ALERT - BLOOD CULTURE IDENTIFICATION (BCID)  Results for orders placed or performed during the hospital encounter of 06/22/16  Blood Culture ID Panel (Reflexed) (Collected: 06/22/2016  5:18 AM)  Result Value Ref Range   Enterococcus species NOT DETECTED NOT DETECTED   Listeria monocytogenes NOT DETECTED NOT DETECTED   Staphylococcus species NOT DETECTED NOT DETECTED   Staphylococcus aureus NOT DETECTED NOT DETECTED   Streptococcus species NOT DETECTED NOT DETECTED   Streptococcus agalactiae NOT DETECTED NOT DETECTED   Streptococcus pneumoniae NOT DETECTED NOT DETECTED   Streptococcus pyogenes NOT DETECTED NOT DETECTED   Acinetobacter baumannii NOT DETECTED NOT DETECTED   Enterobacteriaceae species DETECTED (A) NOT DETECTED   Enterobacter cloacae complex NOT DETECTED NOT DETECTED   Escherichia coli DETECTED (A) NOT DETECTED   Klebsiella oxytoca NOT DETECTED NOT DETECTED   Klebsiella pneumoniae NOT DETECTED NOT DETECTED   Proteus species NOT DETECTED NOT DETECTED   Serratia marcescens NOT DETECTED NOT DETECTED   Carbapenem resistance NOT DETECTED NOT DETECTED   Haemophilus influenzae NOT DETECTED NOT DETECTED   Neisseria meningitidis NOT DETECTED NOT DETECTED   Pseudomonas aeruginosa NOT DETECTED NOT DETECTED   Candida albicans NOT DETECTED NOT DETECTED   Candida glabrata NOT DETECTED NOT DETECTED   Candida krusei NOT DETECTED NOT DETECTED   Candida parapsilosis NOT DETECTED NOT DETECTED   Candida tropicalis NOT DETECTED NOT DETECTED    Name of physician (or Provider) Contacted: K Schorr ([facility])  Changes to prescribed antibiotics required: Recommended to continue Zosyn and DC Vancomycin   Narda Bonds 06/23/2016  6:03 AM

## 2016-06-23 NOTE — Progress Notes (Signed)
PHARMACY - PHYSICIAN COMMUNICATION CRITICAL VALUE ALERT - BLOOD CULTURE IDENTIFICATION (BCID)  Results for orders placed or performed during the hospital encounter of 06/22/16  Blood Culture ID Panel (Reflexed) (Collected: 06/22/2016  5:18 AM)  Result Value Ref Range   Enterococcus species NOT DETECTED NOT DETECTED   Listeria monocytogenes NOT DETECTED NOT DETECTED   Staphylococcus species NOT DETECTED NOT DETECTED   Staphylococcus aureus NOT DETECTED NOT DETECTED   Streptococcus species NOT DETECTED NOT DETECTED   Streptococcus agalactiae NOT DETECTED NOT DETECTED   Streptococcus pneumoniae NOT DETECTED NOT DETECTED   Streptococcus pyogenes NOT DETECTED NOT DETECTED   Acinetobacter baumannii NOT DETECTED NOT DETECTED   Enterobacteriaceae species DETECTED (A) NOT DETECTED   Enterobacter cloacae complex NOT DETECTED NOT DETECTED   Escherichia coli DETECTED (A) NOT DETECTED   Klebsiella oxytoca NOT DETECTED NOT DETECTED   Klebsiella pneumoniae NOT DETECTED NOT DETECTED   Proteus species NOT DETECTED NOT DETECTED   Serratia marcescens NOT DETECTED NOT DETECTED   Carbapenem resistance NOT DETECTED NOT DETECTED   Haemophilus influenzae NOT DETECTED NOT DETECTED   Neisseria meningitidis NOT DETECTED NOT DETECTED   Pseudomonas aeruginosa NOT DETECTED NOT DETECTED   Candida albicans NOT DETECTED NOT DETECTED   Candida glabrata NOT DETECTED NOT DETECTED   Candida krusei NOT DETECTED NOT DETECTED   Candida parapsilosis NOT DETECTED NOT DETECTED   Candida tropicalis NOT DETECTED NOT DETECTED    Name of physician (or Provider) Contacted: [facility]  Changes to prescribed antibiotics required:  most recent blood cultures with gram negative rods but presumptive Pseudomonas aeruginosa per BCID/micro lab. Continue Zosyn and await final c/s.    Vincenza Hews, PharmD, BCPS 06/23/2016, 12:11 PM    Duayne Cal 06/23/2016  12:07 PM

## 2016-06-23 NOTE — Progress Notes (Signed)
Family Medicine Teaching Service Daily Progress Note Intern Pager: [phone]  Patient name: Ryan [name] Medical record number: [id number] Date of birth: [birth year] Age: 76 y.o. Gender: male  Primary Care Provider: Zenia Resides, MD Consultants: none Code Status: FULL  Pt Overview and Major Events to Date:  5/14 admitted with sepsis 2/2 urinary source 5/15 blood culture with E coli and enterobacter  Assessment and Plan: MASAYOSHI COUZENS is a 76 y.o. male presenting with shaking and back pain consistent with UTI and sepsis. PMH is significant for DVTs with lifelong anticoagulation, BPH, hyperlipidemia, insomnia, thoracic aortic aneurysm/abdominal aortic aneurysm, restless leg syndrome, polyarthralgia.  UTI: Initial presentation consistent with sepsis, VSS now after 24 hour on abx and fluid resuscitation. Urine culture sent, blood culture with Enterobacteriaceae and E coli overnight, vancomycin d/c'd.  - discontinue IVF  - Urine culture: Pending - await blood culture sensititivies - continue zosyn, d/c vanc  MWN:[id number] on admit 1.37 (baseline ~1.04). Decrease to 1.2 today with copious fluid resuscitation. Expect some component of prerenal and post renal etiologies with dehydration and BPH.  - monitor daily BMP - measure post void residual - consider foley if PVR significant  BPH: Prior urinary tract infection as well as current UTI resulting in sepsis. Patient is on home Flomax. Denies dysuria but endorses weakened urinary stream.  - Continue Flomax at home dose  - I's & O's  Concern for alcoholism: Patient endorsed regular alcohol use. 2-3 beers per day. Increased alcohol use over the past 24 hours. Prior episode of "dehydration" around Christmas time which patient reported was likely due to alcohol use as well.  - CIWA protocol (0 so far) - Last drink: 06/21/16 PM  History of DVT: Patient has been on Xarelto daily for many years due to 2 separate episodes of unprovoked  DVTs - Continue Xarelto 20 mg daily   Thoracic aortic aneurysm/abdominal aortic aneurysm - CTA obtained in ED showed no acute changes; outpatient follow-up as appropriate  Hyperlipidemia - Continue home statin   Insomnia - Holding home Klonopin - Continue home trazodone  FEN/GI: Heart healthy diet Prophylaxis: Full anticoagulation with Xarelto   Disposition: transfer to tele   Subjective:  Patient feels much better this morning, endorses frequent urination overnight. Wants to go home, did not sleep well.   Objective: Temp:  [98.3 F (36.8 C)-99.7 F (37.6 C)] 98.5 F (36.9 C) (05/15 0400) Pulse Rate:  [46-91] 59 (05/15 0603) Resp:  [14-27] 17 (05/15 0603) BP: (55-139)/(32-81) 112/71 (05/15 0603) SpO2:  [88 %-98 %] 91 % (05/15 0603) Weight:  [210 lb 12.2 oz (95.6 kg)] 210 lb 12.2 oz (95.6 kg) (05/14 1424) Physical Exam: General: Well appearing male resting in chair in NAD.  Cardiovascular: bradycardic, regular rhythm, no murmur Respiratory: CTAB, easy WOB Abdomen: SNTND, +BS Extremities: no edema, warm  Laboratory:  Recent Labs Lab 06/22/16 0457 06/22/16 0512 06/23/16 0535  WBC 3.7*  --  7.3  HGB 15.8 15.6 14.5  HCT 46.9 46.0 44.1  PLT 154  --  131*    Recent Labs Lab 06/22/16 0457 06/22/16 0512 06/23/16 0535  NA 137 138 137  K 3.9 3.9 4.0  CL 105 107 109  CO2 19*  --  20*  BUN 24* 26* 14  CREATININE 1.37* 1.10 1.20  CALCIUM 8.8*  --  8.3*  PROT 6.2*  --  5.6*  BILITOT 1.3*  --  1.1  ALKPHOS 62  --  47  ALT 32  --  33  AST 57*  --  36  GLUCOSE 135* 134* 121*    Lactic acid 3.57>1.3>2.6>0.9  Imaging/Diagnostic Tests: Ct Angio Chest W/cm &/or Wo Cm  Result Date: 06/11/2016 CLINICAL DATA:  f/u thoracic aneurysm /former smoker No surgery No hx of ca No injury EXAM: CT ANGIOGRAPHY CHEST WITH CONTRAST TECHNIQUE: Multidetector CT imaging of the chest was performed using the standard protocol during bolus administration of intravenous contrast.  Multiplanar CT image reconstructions and MIPs were obtained to evaluate the vascular anatomy. Creatinine was obtained on site at [place] at [address] Results: Creatinine 1.1 mg/dL.  GFR 64. CONTRAST:  80 mL of Isovue 370 intravenous contrast COMPARISON:  02/04/2016 FINDINGS: Cardiovascular: The ascending thoracic aorta is dilated to 4.7 cm, previously measured 4.9 cm, with the difference in measurement technique only. No aortic dissection. Minor atherosclerotic disease noted along the arch and descending thoracic aorta and origin of the left subclavian artery. No significant stenosis. Heart is normal in size. There are mild coronary artery calcifications. Pulmonary arteries are normal in caliber. The Mediastinum/Nodes: No enlarged mediastinal, hilar, or axillary lymph nodes. Thyroid gland, trachea, and esophagus demonstrate no significant findings. Lungs/Pleura: Minor dependent lower lobe subsegmental atelectasis. Lungs otherwise clear. No pleural effusion. No pneumothorax. Upper Abdomen: No acute findings. There is dilation of the infrarenal abdominal aorta, which is incompletely imaged. It measures at least 3.3 cm in anterior-posterior dimension. Musculoskeletal: No fracture or acute finding. No osteoblastic or osteolytic lesions. Review of the MIP images confirms the above findings. IMPRESSION: 1. Stable aneurysm of the ascending thoracic aorta measuring 4.7 cm. No aortic dissection. Minor atherosclerotic disease noted along the thoracic aorta. No significant stenosis. Ascending thoracic aortic aneurysm. Recommend semi-annual imaging followup by CTA or MRA and referral to cardiothoracic surgery if not already obtained. This recommendation follows 2010 ACCF/AHA/AATS/ACR/ASA/SCA/SCAI/SIR/STS/SVM Guidelines for the Diagnosis and Management of Patients With Thoracic Aortic Disease. Circulation. 2010; 121: e266-e369 2. 10 mm ground-glass opacity noted in the right upper lobe on the prior study  has resolved. There is minor dependent lower lobe subsegmental atelectasis, improved from the prior study. Lungs are otherwise clear. 3. Partly imaged aneurysm of the infrarenal abdominal aorta measuring at least 3.3 cm. Recommend follow-up ultrasound or CTA of the abdominal aorta for more comprehensive evaluation. Electronically Signed   By: Lajean Manes M.D.   On: 06/11/2016 09:07   Dg Chest Port 1 View  Result Date: 06/22/2016 CLINICAL DATA:  Fever today. EXAM: PORTABLE CHEST 1 VIEW COMPARISON:  Chest CT 06/11/2016, radiographs 02/04/2016 FINDINGS: Unchanged heart size and mediastinal contours. Atherosclerosis of the aortic arch. Lower lung volumes from prior exam. No pulmonary edema, consolidation, pleural fluid or pneumothorax. Grossly stable osseous structures. IMPRESSION: No acute abnormality.  No evidence of pneumonia. Electronically Signed   By: Jeb Levering M.D.   On: 06/22/2016 06:02   Ct Angio Chest/abd/pel For Dissection W And/or Wo Contrast  Result Date: 06/22/2016 CLINICAL DATA:  Persistent chest and abdomen pain with extension to back EXAM: CT ANGIOGRAPHY CHEST, ABDOMEN AND PELVIS TECHNIQUE: Initially, axial CT images were obtained through the chest without intravenous contrast maternal administration. Multidetector CT imaging through the chest, abdomen and pelvis was performed using the standard protocol during bolus administration of intravenous contrast. Multiplanar reconstructed images and MIPs were obtained and reviewed to evaluate the vascular anatomy. CONTRAST:  100 mL Isovue 370 nonionic COMPARISON:  Chest CT angiogram Jun 11, 2016 ; chest radiograph Jun 22, 2016 FINDINGS: CTA CHEST FINDINGS Cardiovascular: No aortic hematoma seen  on the precontrast study. The ascending thoracic aorta has a diameter measured at 4.7 x 4.5 cm, unchanged from recent study. There is no demonstrable thoracic aortic dissection. Visualized great vessels appear unremarkable. Scattered foci of  atherosclerotic calcification are noted in the aorta as well as scattered foci of coronary artery calcification. Heart is upper normal in size. There is no pericardial effusion evident. Mediastinum/Nodes: No thyroid lesions are evident. There is no appreciable thoracic adenopathy. There is a focal hiatal hernia. Lungs/Pleura: There is patchy bibasilar atelectatic change. There is equivocal early consolidation in the posterior segment of the right lower lobe. Lungs elsewhere clear. No pleural effusion or pleural thickening evident. Musculoskeletal: There is degenerative change in thoracic spine. There are no evident blastic or lytic bone lesions. Review of the MIP images confirms the above findings. CTA ABDOMEN AND PELVIS FINDINGS VASCULAR Aorta: There is focal aneurysmal dilatation in the distal aorta inferior to the renal arteries which terminates above the bifurcation. This aneurysm extends over a distance superior inferior of just under 5 cm. This aneurysm has a maximum transverse diameter of 3.5 x 3.2 cm. There is no abdominal aortic dissection. There is calcification in the abdominal aorta without hemodynamically significant obstruction. There is no periaortic fluid. Celiac: There is mild calcification at the origin of the celiac artery without hemodynamically significant obstruction. No aneurysm or dissection of this vessel or its major branches. Major branches appear patent. SMA: There is calcification at the origin of the superior mesenteric artery without hemodynamically significant obstruction. No aneurysm or dissection of this vessel or its major branches. Major branches patent. Renals: Single renal artery is noted on each side. There is relatively mild atherosclerotic calcification at the origin of each renal artery with less than 50% diameter stenosis in these areas. There is no aneurysm or dissection in the renal arteries or its branches. No fibromuscular dysplasia evident. Major renal artery branches  appear patent. IMA: Inferior mesenteric artery is diminutive but patent. No aneurysm or dissection. The abdominal aortic aneurysm arises immediately inferior to the inferior mesenteric artery. Inflow: There is moderate calcification in the proximal common iliac arteries, somewhat more on the right than on the left without hemodynamically significant obstruction penis moderate calcification each hypogastric artery. There is calcification in both common femoral arteries with slightly less than 50% diameter stenosis in the proximal common femoral arteries bilaterally. Mild calcification is noted in the proximal superficial femoral arteries without hemodynamically significant obstruction. No aneurysm or dissection. Veins: No obvious venous abnormality within the limitations of this arterial phase study. Review of the MIP images confirms the above findings. NON-VASCULAR Hepatobiliary: Liver measures 22.0 cm in length. No focal liver lesions are evident. Gallbladder wall is not appreciably thickened. There is no biliary duct dilatation. Pancreas: No pancreatic mass or inflammatory focus. Spleen: No splenic lesions are evident. Adrenals/Urinary Tract: Adrenals appear normal bilaterally. Kidneys bilaterally show no evident mass or hydronephrosis on either side. There is an extrarenal pelvis on each side, an anatomic variant. There is no renal or ureteral calculus on either side. Urinary bladder is midline without wall thickening. Stomach/Bowel: There is no bowel wall or mesenteric thickening. No bowel obstruction. No free air or portal venous air. Lymphatic: There is no appreciable adenopathy in the abdomen or pelvis. Reproductive: Prostate is mildly prominent with seminal vesicles normal in size and contour. No pelvic mass evident. Other: Appendix absent. No ascites or abscess in the abdomen or pelvis. There is a small ventral hernia containing only fat. There is fat  in each inguinal ring. Musculoskeletal: There is  degenerative change in the lumbar spine. There are no blastic or lytic bone lesions. There is no intramuscular or abdominal wall lesion. Review of the MIP images confirms the above findings. IMPRESSION: CT angiogram chest: Ascending thoracic aortic diameter measures 4.7 x 4.5 cm, unchanged from recent study. No thoracic aortic dissection evident. Ascending thoracic aortic aneurysm. Recommend semi-annual imaging followup by CTA or MRA and referral to cardiothoracic surgery if not already obtained. This recommendation follows 2010 ACCF/AHA/AATS/ACR/ASA/SCA/SCAI/SIR/STS/SVM Guidelines for the Diagnosis and Management of Patients With Thoracic Aortic Disease. Circulation. 2010; 121: Q197-J883. Scattered areas of atherosclerotic calcification including foci of coronary artery calcification. Bibasilar atelectasis with questionable early pneumonia posterior segment right lower lobe. No adenopathy. Small hiatal hernia. CT angiogram abdomen and pelvis: Distal abdominal aortic aneurysm which arises just inferior to the inferior mesenteric artery and terminates above the bifurcation. Aneurysm measures 3.5 x 3.2 cm in diameter and extends over just under 5 cm of length. Recommend followup by ultrasound in 2 years. This recommendation follows ACR consensus guidelines: White Paper of the ACR Incidental Findings Committee II on Vascular Findings. J Am Coll Radiol 2013; 10:789-794. Areas of atherosclerotic calcification at multiple sites without hemodynamically significant obstruction. No aneurysm or dissection noted in major pelvic or mesenteric arterial vessels. Prominent liver without focal lesion. No bowel obstruction.  No abscess.  Appendix absent. Prostate prominent; advise correlation with PSA. Small ventral hernia.  Fat noted in each inguinal ring.  1 Electronically Signed   By: Lowella Grip III M.D.   On: 06/22/2016 07:45    Sela Hilding, MD 06/23/2016, 7:23 AM PGY-1, [place]  Intern pager: [phone], text pages welcome

## 2016-06-23 NOTE — Progress Notes (Signed)
Social visit from PCP.  Ryan [name], the [facility] family and I are grateful for the excellent care the inpatient team has provided.  He is feeling much better from his now well documented gram negative sepsis.  I suspect he has had significant, chronic asymptomatic urinary retention.  Part of follow up will be with uro to be certain we have optimally treated.  He had very abrupt onset of symptoms, which makes early recognition and treatment more difficult.  Fortunately, he is intelligent and has resources.  He will follow up with whatever we recommend.

## 2016-06-24 ENCOUNTER — Encounter: Payer: Medicare Other | Admitting: Cardiothoracic Surgery

## 2016-06-24 LAB — CBC
HEMATOCRIT: 43.3 % (ref 39.0–52.0)
Hemoglobin: 15 g/dL (ref 13.0–17.0)
MCH: 32.5 pg (ref 26.0–34.0)
MCHC: 34.6 g/dL (ref 30.0–36.0)
MCV: 93.7 fL (ref 78.0–100.0)
Platelets: 121 10*3/uL — ABNORMAL LOW (ref 150–400)
RBC: 4.62 MIL/uL (ref 4.22–5.81)
RDW: 13.5 % (ref 11.5–15.5)
WBC: 4.7 10*3/uL (ref 4.0–10.5)

## 2016-06-24 LAB — URINE CULTURE

## 2016-06-24 NOTE — Progress Notes (Addendum)
Family Medicine Teaching Service Daily Progress Note Intern Pager: [phone]  Patient name: PRADYUN ISHMAN Medical record number: [id number] Date of birth: 1940/03/15 Age: 76 y.o. Gender: male  Primary Care Provider: Zenia Resides, MD Consultants: none Code Status: FULL  Pt Overview and Major Events to Date:  5/14 admitted with sepsis 2/2 urinary source 5/15 blood culture with E coli and Pseudomonas  5/16 E coli pan sensitive, awaiting Pseudomonas sensitivities  Assessment and Plan: CHINMAY SQUIER is a 76 y.o. male presenting with shaking and back pain consistent with UTI and sepsis. PMH is significant for DVTs with lifelong anticoagulation, BPH, hyperlipidemia, insomnia, thoracic aortic aneurysm/abdominal aortic aneurysm, restless leg syndrome, polyarthralgia.  UTI: Initial presentation consistent with sepsis, VSS now after abx and fluid resuscitation. Urine culture sent, blood culture with Enterobacteriaceae and E coli as well as Pseudomonas. Discussed Pseudomonas with Dr. Linus Salmons, who thought it could be a contaminant.  - Urine culture with E coli (pan sensitive)  - await blood culture Pseudomonas sensitivities pending - continue zosyn  AKI: Creatinine on admit 1.37 (baseline ~1.04). 1.2 > 1.06. Expect some component of prerenal and post renal etiologies with dehydration and BPH.  - monitor daily BMP  BPH: Prior urinary tract infection as well as current UTI resulting in sepsis. Patient is on home Flomax. Denies dysuria but endorses weakened urinary stream.  - Continue Flomax at home dose  - I's & O's  Concern for alcoholism: Patient endorsed regular alcohol use. 2-3 beers per day. Increased alcohol use over the past 24 hours. Prior episode of "dehydration" around Christmas time which patient reported was likely due to alcohol use as well.  - CIWA protocol (0 so far) - Last drink: 06/21/16 PM  History of DVT: Patient has been on Xarelto daily for many years due to 2  separate episodes of unprovoked DVTs - Continue Xarelto 20 mg daily   Thoracic aortic aneurysm/abdominal aortic aneurysm - CTA obtained in ED showed no acute changes; outpatient follow-up as appropriate  Hyperlipidemia - Continue home statin   Insomnia - Holding home Klonopin - Continue home trazodone  FEN/GI: Heart healthy diet Prophylaxis: Full anticoagulation with Xarelto   Disposition: transfer to tele   Subjective:  Patient feels well. He and his wife have questions about blood cultures and when they can be discharged.   Objective: Temp:  [97.8 F (36.6 C)-98.8 F (37.1 C)] 98.4 F (36.9 C) (05/16 0620) Pulse Rate:  [48-59] 59 (05/16 0620) Resp:  [17-27] 17 (05/16 0620) BP: (104-159)/(64-83) 132/72 (05/16 0620) SpO2:  [92 %-98 %] 95 % (05/16 2595) Physical Exam: General: Well appearing male resting in bed in NAD.  Cardiovascular: bradycardic, regular rhythm, no murmur Respiratory: CTAB, easy WOB Abdomen: SNTND, +BS Extremities: no edema, warm  Laboratory:  Recent Labs Lab 06/22/16 0457 06/22/16 0512 06/23/16 0535  WBC 3.7*  --  7.3  HGB 15.8 15.6 14.5  HCT 46.9 46.0 44.1  PLT 154  --  131*    Recent Labs Lab 06/22/16 0457 06/22/16 0512 06/23/16 0535 06/23/16 1644  NA 137 138 137 138  K 3.9 3.9 4.0 4.0  CL 105 107 109 108  CO2 19*  --  20* 22  BUN 24* 26* 14 11  CREATININE 1.37* 1.10 1.20 1.06  CALCIUM 8.8*  --  8.3* 8.9  PROT 6.2*  --  5.6*  --   BILITOT 1.3*  --  1.1  --   ALKPHOS 62  --  47  --  ALT 32  --  33  --   AST 57*  --  36  --   GLUCOSE 135* 134* 121* 95    Lactic acid 3.57>1.3>2.6>0.9  Imaging/Diagnostic Tests: Ct Angio Chest W/cm &/or Wo Cm  Result Date: 06/11/2016 CLINICAL DATA:  f/u thoracic aneurysm /former smoker No surgery No hx of ca No injury EXAM: CT ANGIOGRAPHY CHEST WITH CONTRAST TECHNIQUE: Multidetector CT imaging of the chest was performed using the standard protocol during bolus administration of  intravenous contrast. Multiplanar CT image reconstructions and MIPs were obtained to evaluate the vascular anatomy. Creatinine was obtained on site at [facility] [place] at [address] Results: Creatinine 1.1 mg/dL.  GFR 64. CONTRAST:  80 mL of Isovue 370 intravenous contrast COMPARISON:  02/04/2016 FINDINGS: Cardiovascular: The ascending thoracic aorta is dilated to 4.7 cm, previously measured 4.9 cm, with the difference in measurement technique only. No aortic dissection. Minor atherosclerotic disease noted along the arch and descending thoracic aorta and origin of the left subclavian artery. No significant stenosis. Heart is normal in size. There are mild coronary artery calcifications. Pulmonary arteries are normal in caliber. The Mediastinum/Nodes: No enlarged mediastinal, hilar, or axillary lymph nodes. Thyroid gland, trachea, and esophagus demonstrate no significant findings. Lungs/Pleura: Minor dependent lower lobe subsegmental atelectasis. Lungs otherwise clear. No pleural effusion. No pneumothorax. Upper Abdomen: No acute findings. There is dilation of the infrarenal abdominal aorta, which is incompletely imaged. It measures at least 3.3 cm in anterior-posterior dimension. Musculoskeletal: No fracture or acute finding. No osteoblastic or osteolytic lesions. Review of the MIP images confirms the above findings. IMPRESSION: 1. Stable aneurysm of the ascending thoracic aorta measuring 4.7 cm. No aortic dissection. Minor atherosclerotic disease noted along the thoracic aorta. No significant stenosis. Ascending thoracic aortic aneurysm. Recommend semi-annual imaging followup by CTA or MRA and referral to cardiothoracic surgery if not already obtained. This recommendation follows 2010 ACCF/AHA/AATS/ACR/ASA/SCA/SCAI/SIR/STS/SVM Guidelines for the Diagnosis and Management of Patients With Thoracic Aortic Disease. Circulation. 2010; 121: e266-e369 2. 10 mm ground-glass opacity noted in the right upper lobe  on the prior study has resolved. There is minor dependent lower lobe subsegmental atelectasis, improved from the prior study. Lungs are otherwise clear. 3. Partly imaged aneurysm of the infrarenal abdominal aorta measuring at least 3.3 cm. Recommend follow-up ultrasound or CTA of the abdominal aorta for more comprehensive evaluation. Electronically Signed   By: Lajean Manes M.D.   On: 06/11/2016 09:07   Dg Chest Port 1 View  Result Date: 06/22/2016 CLINICAL DATA:  Fever today. EXAM: PORTABLE CHEST 1 VIEW COMPARISON:  Chest CT 06/11/2016, radiographs 02/04/2016 FINDINGS: Unchanged heart size and mediastinal contours. Atherosclerosis of the aortic arch. Lower lung volumes from prior exam. No pulmonary edema, consolidation, pleural fluid or pneumothorax. Grossly stable osseous structures. IMPRESSION: No acute abnormality.  No evidence of pneumonia. Electronically Signed   By: Jeb Levering M.D.   On: 06/22/2016 06:02   Ct Angio Chest/abd/pel For Dissection W And/or Wo Contrast  Result Date: 06/22/2016 CLINICAL DATA:  Persistent chest and abdomen pain with extension to back EXAM: CT ANGIOGRAPHY CHEST, ABDOMEN AND PELVIS TECHNIQUE: Initially, axial CT images were obtained through the chest without intravenous contrast maternal administration. Multidetector CT imaging through the chest, abdomen and pelvis was performed using the standard protocol during bolus administration of intravenous contrast. Multiplanar reconstructed images and MIPs were obtained and reviewed to evaluate the vascular anatomy. CONTRAST:  100 mL Isovue 370 nonionic COMPARISON:  Chest CT angiogram Jun 11, 2016 ;  chest radiograph Jun 22, 2016 FINDINGS: CTA CHEST FINDINGS Cardiovascular: No aortic hematoma seen on the precontrast study. The ascending thoracic aorta has a diameter measured at 4.7 x 4.5 cm, unchanged from recent study. There is no demonstrable thoracic aortic dissection. Visualized great vessels appear unremarkable. Scattered  foci of atherosclerotic calcification are noted in the aorta as well as scattered foci of coronary artery calcification. Heart is upper normal in size. There is no pericardial effusion evident. Mediastinum/Nodes: No thyroid lesions are evident. There is no appreciable thoracic adenopathy. There is a focal hiatal hernia. Lungs/Pleura: There is patchy bibasilar atelectatic change. There is equivocal early consolidation in the posterior segment of the right lower lobe. Lungs elsewhere clear. No pleural effusion or pleural thickening evident. Musculoskeletal: There is degenerative change in thoracic spine. There are no evident blastic or lytic bone lesions. Review of the MIP images confirms the above findings. CTA ABDOMEN AND PELVIS FINDINGS VASCULAR Aorta: There is focal aneurysmal dilatation in the distal aorta inferior to the renal arteries which terminates above the bifurcation. This aneurysm extends over a distance superior inferior of just under 5 cm. This aneurysm has a maximum transverse diameter of 3.5 x 3.2 cm. There is no abdominal aortic dissection. There is calcification in the abdominal aorta without hemodynamically significant obstruction. There is no periaortic fluid. Celiac: There is mild calcification at the origin of the celiac artery without hemodynamically significant obstruction. No aneurysm or dissection of this vessel or its major branches. Major branches appear patent. SMA: There is calcification at the origin of the superior mesenteric artery without hemodynamically significant obstruction. No aneurysm or dissection of this vessel or its major branches. Major branches patent. Renals: Single renal artery is noted on each side. There is relatively mild atherosclerotic calcification at the origin of each renal artery with less than 50% diameter stenosis in these areas. There is no aneurysm or dissection in the renal arteries or its branches. No fibromuscular dysplasia evident. Major renal artery  branches appear patent. IMA: Inferior mesenteric artery is diminutive but patent. No aneurysm or dissection. The abdominal aortic aneurysm arises immediately inferior to the inferior mesenteric artery. Inflow: There is moderate calcification in the proximal common iliac arteries, somewhat more on the right than on the left without hemodynamically significant obstruction penis moderate calcification each hypogastric artery. There is calcification in both common femoral arteries with slightly less than 50% diameter stenosis in the proximal common femoral arteries bilaterally. Mild calcification is noted in the proximal superficial femoral arteries without hemodynamically significant obstruction. No aneurysm or dissection. Veins: No obvious venous abnormality within the limitations of this arterial phase study. Review of the MIP images confirms the above findings. NON-VASCULAR Hepatobiliary: Liver measures 22.0 cm in length. No focal liver lesions are evident. Gallbladder wall is not appreciably thickened. There is no biliary duct dilatation. Pancreas: No pancreatic mass or inflammatory focus. Spleen: No splenic lesions are evident. Adrenals/Urinary Tract: Adrenals appear normal bilaterally. Kidneys bilaterally show no evident mass or hydronephrosis on either side. There is an extrarenal pelvis on each side, an anatomic variant. There is no renal or ureteral calculus on either side. Urinary bladder is midline without wall thickening. Stomach/Bowel: There is no bowel wall or mesenteric thickening. No bowel obstruction. No free air or portal venous air. Lymphatic: There is no appreciable adenopathy in the abdomen or pelvis. Reproductive: Prostate is mildly prominent with seminal vesicles normal in size and contour. No pelvic mass evident. Other: Appendix absent. No ascites or abscess in the abdomen  or pelvis. There is a small ventral hernia containing only fat. There is fat in each inguinal ring. Musculoskeletal: There  is degenerative change in the lumbar spine. There are no blastic or lytic bone lesions. There is no intramuscular or abdominal wall lesion. Review of the MIP images confirms the above findings. IMPRESSION: CT angiogram chest: Ascending thoracic aortic diameter measures 4.7 x 4.5 cm, unchanged from recent study. No thoracic aortic dissection evident. Ascending thoracic aortic aneurysm. Recommend semi-annual imaging followup by CTA or MRA and referral to cardiothoracic surgery if not already obtained. This recommendation follows 2010 ACCF/AHA/AATS/ACR/ASA/SCA/SCAI/SIR/STS/SVM Guidelines for the Diagnosis and Management of Patients With Thoracic Aortic Disease. Circulation. 2010; 121: Q583-M621. Scattered areas of atherosclerotic calcification including foci of coronary artery calcification. Bibasilar atelectasis with questionable early pneumonia posterior segment right lower lobe. No adenopathy. Small hiatal hernia. CT angiogram abdomen and pelvis: Distal abdominal aortic aneurysm which arises just inferior to the inferior mesenteric artery and terminates above the bifurcation. Aneurysm measures 3.5 x 3.2 cm in diameter and extends over just under 5 cm of length. Recommend followup by ultrasound in 2 years. This recommendation follows ACR consensus guidelines: White Paper of the ACR Incidental Findings Committee II on Vascular Findings. J Am Coll Radiol 2013; 10:789-794. Areas of atherosclerotic calcification at multiple sites without hemodynamically significant obstruction. No aneurysm or dissection noted in major pelvic or mesenteric arterial vessels. Prominent liver without focal lesion. No bowel obstruction.  No abscess.  Appendix absent. Prostate prominent; advise correlation with PSA. Small ventral hernia.  Fat noted in each inguinal ring.  1 Electronically Signed   By: Lowella Grip III M.D.   On: 06/22/2016 07:45    Sela Hilding, MD 06/24/2016, 7:31 AM PGY-1, [place]  Intern pager: [phone], text pages welcome

## 2016-06-25 LAB — CULTURE, BLOOD (ROUTINE X 2)
SPECIAL REQUESTS: ADEQUATE
Special Requests: ADEQUATE

## 2016-06-25 MED ORDER — CIPROFLOXACIN HCL 500 MG PO TABS: 500.0000 mg | ORAL_TABLET | Freq: Two times a day (BID) | ORAL | 0 refills | Status: AC

## 2016-06-25 MED ORDER — CIPROFLOXACIN HCL 500 MG PO TABS
500.0000 mg | ORAL_TABLET | Freq: Two times a day (BID) | ORAL | Status: DC
  Administered 2016-06-25: 500 mg via ORAL
  Filled 2016-06-25: qty 1

## 2016-06-25 NOTE — Progress Notes (Signed)
Transitions of Care Pharmacy Note  Plan:  Educated on new cipro prescription and continuation of PTA Xarelto  Outpatient follow-up: clinical picture, cultures if needed  --------------------------------------------- Ryan [name] is an 76 y.o. male who presents with a chief complaint UTI/Sepsis. In anticipation of discharge, pharmacy has reviewed this patient's prior to admission medication history, as well as current inpatient medications listed per the [facility].  Current medication indications, dosing, frequency, and notable side effects reviewed with patient. patient verbalized understanding of current inpatient medication regimen and is aware that the After Visit Summary when presented, will represent the most accurate medication list at discharge.   Ryan [name] did not express any concerns regarding his medications. He is aware that he will need to complete his antibiotic in its entirety. Educated patient on separating this antibiotic from calcium-containing products. We also reviewed his PTA Xarelto and dicussed any concerns re: this medication.   The patient did not have any questions or concerns at the conclusion of our visit.    Assessment: Understanding of regimen: good Understanding of indications: good Potential of compliance: good   Patient instructed to contact inpatient pharmacy team with further questions or concerns if needed.    Time spent preparing for discharge counseling: 10 min  Time spent counseling patient: 15 min    Argie Ramming, PharmD Pharmacy Resident  Pager [phone] 06/25/16 3:00 PM

## 2016-06-25 NOTE — Discharge Instructions (Addendum)

## 2016-06-25 NOTE — Progress Notes (Signed)
NURSING PROGRESS NOTE  Ryan [name] [id number] Discharge Data: 06/25/2016 3:53 PM Attending Provider: Leeanne Rio, MD PQD:IYMEBR, Jamal Collin, MD     [name] to be D/C'd Home per MD order.  Discussed with the patient the After Visit Summary and all questions fully answered. All IV's discontinued with no bleeding noted. All belongings returned to patient for patient to take home.   Last Vital Signs:  Blood pressure 130/68, pulse (!) 51, temperature 99.7 F (37.6 C), temperature source Oral, resp. rate 18, height 6\' 4"  (1.93 m), weight 95.6 kg (210 lb 12.2 oz), SpO2 97 %.  Discharge Medication List Allergies as of 06/25/2016      Reactions   Simvastatin Itching   REACTION: Low level CK elevation   Warfarin Sodium Rash      Medication List    STOP taking these medications   atorvastatin 10 MG tablet Commonly known as:  LIPITOR   clonazePAM 1 MG tablet Commonly known as:  KLONOPIN   prednisoLONE acetate 1 % ophthalmic suspension Commonly known as:  PRED FORTE     TAKE these medications   acetaminophen 325 MG tablet Commonly known as:  TYLENOL Take 650 mg by mouth every 6 (six) hours as needed for mild pain.   ciprofloxacin 500 MG tablet Commonly known as:  CIPRO Take 1 tablet (500 mg total) by mouth 2 (two) times daily.   Glucosamine-Chondroitin Tabs Take 1 tablet by mouth daily.   multivitamin with minerals Tabs tablet Take 1 tablet by mouth daily.   tamsulosin 0.4 MG Caps capsule Commonly known as:  FLOMAX TAKE (1) CAPSULE DAILY.   traZODone 50 MG tablet Commonly known as:  DESYREL Take 50 mg by mouth at bedtime as needed for sleep.   XARELTO 20 MG Tabs tablet Generic drug:  rivaroxaban TAKE 1 TABLET ONCE DAILY WITH SUPPER.

## 2016-06-25 NOTE — Progress Notes (Signed)
Family Medicine Teaching Service Daily Progress Note Intern Pager: [phone]  Patient name: Ryan [name] Medical record number: [id number] Date of birth: [birth year] Age: 76 y.o. Gender: male  Primary Care Provider: Zenia Resides, MD Consultants: none Code Status: FULL  Pt Overview and Major Events to Date:  5/14 admitted with sepsis 2/2 urinary source 5/15 blood culture with E coli and Pseudomonas  5/16 E coli pan sensitive, awaiting Pseudomonas sensitivities 5/17 Pseudomonas and Ecoli both sensitive to cipro, changed to this  Assessment and Plan: Ryan [name] is a 76 y.o. male presenting with shaking and back pain consistent with UTI and sepsis. PMH is significant for DVTs with lifelong anticoagulation, BPH, hyperlipidemia, insomnia, thoracic aortic aneurysm/abdominal aortic aneurysm, restless leg syndrome, polyarthralgia.  UTI: Initial presentation consistent with sepsis, VSS now after abx and fluid resuscitation. Urine culture sent, blood culture with Enterobacteriaceae and E coli as well as Pseudomonas. Discussed Pseudomonas with Dr. Linus Salmons, who thought it could be a contaminant.  - Urine culture with E coli (pan sensitive)  - blood culture with Pseudomonas and E coli, both sensitive to cipro  - discontinue zosyn, cipro BID 500mg  x 7 more days, end date 5/23)   AKI: Creatinine on admit 1.37 (baseline ~1.04). 1.2 > 1.06. Expect some component of prerenal and post renal etiologies with dehydration and BPH.  - monitor daily BMP  BPH: Prior urinary tract infection as well as current UTI resulting in sepsis. Patient is on home Flomax. Denies dysuria but endorses weakened urinary stream.  - Continue Flomax at home dose  - I's & O's  Concern for alcoholism: Patient endorsed regular alcohol use. 2-3 beers per day. Increased alcohol use over the past 24 hours. Prior episode of "dehydration" around Christmas time which patient reported was likely due to alcohol use as well.  -  CIWA protocol (0 so far) - Last drink: 06/21/16 PM  History of DVT: Patient has been on Xarelto daily for many years due to 2 separate episodes of unprovoked DVTs - Continue Xarelto 20 mg daily   Thoracic aortic aneurysm/abdominal aortic aneurysm - CTA obtained in ED showed no acute changes; outpatient follow-up as appropriate  Hyperlipidemia - patient hasn't been taking lipitor, allergies suggest CK elevation  Insomnia - Holding home Klonopin - Continue home trazodone  FEN/GI: Heart healthy diet Prophylaxis: Full anticoagulation with Xarelto   Disposition: home today  Subjective:  Patient feels great today, excited to go home.   Objective: Temp:  [98.4 F (36.9 C)-100.2 F (37.9 C)] 99.7 F (37.6 C) (05/17 0531) Pulse Rate:  [51-56] 51 (05/17 0531) Resp:  [16-18] 18 (05/17 0531) BP: (130-153)/(68-77) 130/68 (05/17 0531) SpO2:  [96 %-98 %] 97 % (05/17 0531) Physical Exam: General: Well appearing male resting in bed in NAD.  Cardiovascular: bradycardic, regular rhythm, no murmur Respiratory: CTAB, easy WOB Abdomen: SNTND, +BS Extremities: no edema, warm  Laboratory:  Recent Labs Lab 06/22/16 0457 06/22/16 0512 06/23/16 0535 06/24/16 0822  WBC 3.7*  --  7.3 4.7  HGB 15.8 15.6 14.5 15.0  HCT 46.9 46.0 44.1 43.3  PLT 154  --  131* 121*    Recent Labs Lab 06/22/16 0457 06/22/16 0512 06/23/16 0535 06/23/16 1644  NA 137 138 137 138  K 3.9 3.9 4.0 4.0  CL 105 107 109 108  CO2 19*  --  20* 22  BUN 24* 26* 14 11  CREATININE 1.37* 1.10 1.20 1.06  CALCIUM 8.8*  --  8.3* 8.9  PROT 6.2*  --  5.6*  --   BILITOT 1.3*  --  1.1  --   ALKPHOS 62  --  47  --   ALT 32  --  33  --   AST 57*  --  36  --   GLUCOSE 135* 134* 121* 95    Lactic acid 3.57>1.3>2.6>0.9  Imaging/Diagnostic Tests: Ct Angio Chest W/cm &/or Wo Cm  Result Date: 06/11/2016 CLINICAL DATA:  f/u thoracic aneurysm /former smoker No surgery No hx of ca No injury EXAM: CT ANGIOGRAPHY CHEST  WITH CONTRAST TECHNIQUE: Multidetector CT imaging of the chest was performed using the standard protocol during bolus administration of intravenous contrast. Multiplanar CT image reconstructions and MIPs were obtained to evaluate the vascular anatomy. Creatinine was obtained on site at [facility] at [address] Results: Creatinine 1.1 mg/dL.  GFR 64. CONTRAST:  80 mL of Isovue 370 intravenous contrast COMPARISON:  02/04/2016 FINDINGS: Cardiovascular: The ascending thoracic aorta is dilated to 4.7 cm, previously measured 4.9 cm, with the difference in measurement technique only. No aortic dissection. Minor atherosclerotic disease noted along the arch and descending thoracic aorta and origin of the left subclavian artery. No significant stenosis. Heart is normal in size. There are mild coronary artery calcifications. Pulmonary arteries are normal in caliber. The Mediastinum/Nodes: No enlarged mediastinal, hilar, or axillary lymph nodes. Thyroid gland, trachea, and esophagus demonstrate no significant findings. Lungs/Pleura: Minor dependent lower lobe subsegmental atelectasis. Lungs otherwise clear. No pleural effusion. No pneumothorax. Upper Abdomen: No acute findings. There is dilation of the infrarenal abdominal aorta, which is incompletely imaged. It measures at least 3.3 cm in anterior-posterior dimension. Musculoskeletal: No fracture or acute finding. No osteoblastic or osteolytic lesions. Review of the MIP images confirms the above findings. IMPRESSION: 1. Stable aneurysm of the ascending thoracic aorta measuring 4.7 cm. No aortic dissection. Minor atherosclerotic disease noted along the thoracic aorta. No significant stenosis. Ascending thoracic aortic aneurysm. Recommend semi-annual imaging followup by CTA or MRA and referral to cardiothoracic surgery if not already obtained. This recommendation follows 2010 ACCF/AHA/AATS/ACR/ASA/SCA/SCAI/SIR/STS/SVM Guidelines for the Diagnosis and Management  of Patients With Thoracic Aortic Disease. Circulation. 2010; 121: e266-e369 2. 10 mm ground-glass opacity noted in the right upper lobe on the prior study has resolved. There is minor dependent lower lobe subsegmental atelectasis, improved from the prior study. Lungs are otherwise clear. 3. Partly imaged aneurysm of the infrarenal abdominal aorta measuring at least 3.3 cm. Recommend follow-up ultrasound or CTA of the abdominal aorta for more comprehensive evaluation. Electronically Signed   By: Lajean Manes M.D.   On: 06/11/2016 09:07   Dg Chest Port 1 View  Result Date: 06/22/2016 CLINICAL DATA:  Fever today. EXAM: PORTABLE CHEST 1 VIEW COMPARISON:  Chest CT 06/11/2016, radiographs 02/04/2016 FINDINGS: Unchanged heart size and mediastinal contours. Atherosclerosis of the aortic arch. Lower lung volumes from prior exam. No pulmonary edema, consolidation, pleural fluid or pneumothorax. Grossly stable osseous structures. IMPRESSION: No acute abnormality.  No evidence of pneumonia. Electronically Signed   By: Jeb Levering M.D.   On: 06/22/2016 06:02   Ct Angio Chest/abd/pel For Dissection W And/or Wo Contrast  Result Date: 06/22/2016 CLINICAL DATA:  Persistent chest and abdomen pain with extension to back EXAM: CT ANGIOGRAPHY CHEST, ABDOMEN AND PELVIS TECHNIQUE: Initially, axial CT images were obtained through the chest without intravenous contrast maternal administration. Multidetector CT imaging through the chest, abdomen and pelvis was performed using the standard protocol during bolus administration of intravenous contrast. Multiplanar reconstructed images and MIPs  were obtained and reviewed to evaluate the vascular anatomy. CONTRAST:  100 mL Isovue 370 nonionic COMPARISON:  Chest CT angiogram Jun 11, 2016 ; chest radiograph Jun 22, 2016 FINDINGS: CTA CHEST FINDINGS Cardiovascular: No aortic hematoma seen on the precontrast study. The ascending thoracic aorta has a diameter measured at 4.7 x 4.5 cm,  unchanged from recent study. There is no demonstrable thoracic aortic dissection. Visualized great vessels appear unremarkable. Scattered foci of atherosclerotic calcification are noted in the aorta as well as scattered foci of coronary artery calcification. Heart is upper normal in size. There is no pericardial effusion evident. Mediastinum/Nodes: No thyroid lesions are evident. There is no appreciable thoracic adenopathy. There is a focal hiatal hernia. Lungs/Pleura: There is patchy bibasilar atelectatic change. There is equivocal early consolidation in the posterior segment of the right lower lobe. Lungs elsewhere clear. No pleural effusion or pleural thickening evident. Musculoskeletal: There is degenerative change in thoracic spine. There are no evident blastic or lytic bone lesions. Review of the MIP images confirms the above findings. CTA ABDOMEN AND PELVIS FINDINGS VASCULAR Aorta: There is focal aneurysmal dilatation in the distal aorta inferior to the renal arteries which terminates above the bifurcation. This aneurysm extends over a distance superior inferior of just under 5 cm. This aneurysm has a maximum transverse diameter of 3.5 x 3.2 cm. There is no abdominal aortic dissection. There is calcification in the abdominal aorta without hemodynamically significant obstruction. There is no periaortic fluid. Celiac: There is mild calcification at the origin of the celiac artery without hemodynamically significant obstruction. No aneurysm or dissection of this vessel or its major branches. Major branches appear patent. SMA: There is calcification at the origin of the superior mesenteric artery without hemodynamically significant obstruction. No aneurysm or dissection of this vessel or its major branches. Major branches patent. Renals: Single renal artery is noted on each side. There is relatively mild atherosclerotic calcification at the origin of each renal artery with less than 50% diameter stenosis in these  areas. There is no aneurysm or dissection in the renal arteries or its branches. No fibromuscular dysplasia evident. Major renal artery branches appear patent. IMA: Inferior mesenteric artery is diminutive but patent. No aneurysm or dissection. The abdominal aortic aneurysm arises immediately inferior to the inferior mesenteric artery. Inflow: There is moderate calcification in the proximal common iliac arteries, somewhat more on the right than on the left without hemodynamically significant obstruction penis moderate calcification each hypogastric artery. There is calcification in both common femoral arteries with slightly less than 50% diameter stenosis in the proximal common femoral arteries bilaterally. Mild calcification is noted in the proximal superficial femoral arteries without hemodynamically significant obstruction. No aneurysm or dissection. Veins: No obvious venous abnormality within the limitations of this arterial phase study. Review of the MIP images confirms the above findings. NON-VASCULAR Hepatobiliary: Liver measures 22.0 cm in length. No focal liver lesions are evident. Gallbladder wall is not appreciably thickened. There is no biliary duct dilatation. Pancreas: No pancreatic mass or inflammatory focus. Spleen: No splenic lesions are evident. Adrenals/Urinary Tract: Adrenals appear normal bilaterally. Kidneys bilaterally show no evident mass or hydronephrosis on either side. There is an extrarenal pelvis on each side, an anatomic variant. There is no renal or ureteral calculus on either side. Urinary bladder is midline without wall thickening. Stomach/Bowel: There is no bowel wall or mesenteric thickening. No bowel obstruction. No free air or portal venous air. Lymphatic: There is no appreciable adenopathy in the abdomen or pelvis. Reproductive: Prostate  is mildly prominent with seminal vesicles normal in size and contour. No pelvic mass evident. Other: Appendix absent. No ascites or abscess in  the abdomen or pelvis. There is a small ventral hernia containing only fat. There is fat in each inguinal ring. Musculoskeletal: There is degenerative change in the lumbar spine. There are no blastic or lytic bone lesions. There is no intramuscular or abdominal wall lesion. Review of the MIP images confirms the above findings. IMPRESSION: CT angiogram chest: Ascending thoracic aortic diameter measures 4.7 x 4.5 cm, unchanged from recent study. No thoracic aortic dissection evident. Ascending thoracic aortic aneurysm. Recommend semi-annual imaging followup by CTA or MRA and referral to cardiothoracic surgery if not already obtained. This recommendation follows 2010 ACCF/AHA/AATS/ACR/ASA/SCA/SCAI/SIR/STS/SVM Guidelines for the Diagnosis and Management of Patients With Thoracic Aortic Disease. Circulation. 2010; 121: M355-H741. Scattered areas of atherosclerotic calcification including foci of coronary artery calcification. Bibasilar atelectasis with questionable early pneumonia posterior segment right lower lobe. No adenopathy. Small hiatal hernia. CT angiogram abdomen and pelvis: Distal abdominal aortic aneurysm which arises just inferior to the inferior mesenteric artery and terminates above the bifurcation. Aneurysm measures 3.5 x 3.2 cm in diameter and extends over just under 5 cm of length. Recommend followup by ultrasound in 2 years. This recommendation follows ACR consensus guidelines: White Paper of the ACR Incidental Findings Committee II on Vascular Findings. J Am Coll Radiol 2013; 10:789-794. Areas of atherosclerotic calcification at multiple sites without hemodynamically significant obstruction. No aneurysm or dissection noted in major pelvic or mesenteric arterial vessels. Prominent liver without focal lesion. No bowel obstruction.  No abscess.  Appendix absent. Prostate prominent; advise correlation with PSA. Small ventral hernia.  Fat noted in each inguinal ring.  1 Electronically Signed   By: Lowella Grip III M.D.   On: 06/22/2016 07:45    Sela Hilding, MD 06/25/2016, 9:56 AM PGY-1, [facility] Intern pager: [phone], text pages welcome

## 2016-06-29 LAB — CULTURE, BLOOD (ROUTINE X 2)
CULTURE: NO GROWTH
Culture: NO GROWTH
SPECIAL REQUESTS: ADEQUATE
Special Requests: ADEQUATE

## 2016-07-01 ENCOUNTER — Ambulatory Visit (INDEPENDENT_AMBULATORY_CARE_PROVIDER_SITE_OTHER): Payer: Medicare Other | Admitting: Family Medicine

## 2016-07-01 ENCOUNTER — Encounter: Payer: Self-pay | Admitting: Family Medicine

## 2016-07-01 VITALS — BP 116/74 | HR 68 | Temp 98.0°F | Ht 76.0 in | Wt 196.6 lb

## 2016-07-01 DIAGNOSIS — N400 Enlarged prostate without lower urinary tract symptoms: Secondary | ICD-10-CM

## 2016-07-01 DIAGNOSIS — N390 Urinary tract infection, site not specified: Secondary | ICD-10-CM

## 2016-07-01 DIAGNOSIS — E7800 Pure hypercholesterolemia, unspecified: Secondary | ICD-10-CM

## 2016-07-01 LAB — POCT URINALYSIS DIP (MANUAL ENTRY)
BILIRUBIN UA: NEGATIVE
BILIRUBIN UA: NEGATIVE mg/dL
Blood, UA: NEGATIVE
Glucose, UA: NEGATIVE mg/dL
LEUKOCYTES UA: NEGATIVE
Nitrite, UA: NEGATIVE
PH UA: 6.5 (ref 5.0–8.0)
PROTEIN UA: NEGATIVE mg/dL
Spec Grav, UA: 1.02 (ref 1.010–1.025)
Urobilinogen, UA: 0.2 E.U./dL

## 2016-07-01 NOTE — Patient Instructions (Signed)
I will call with lab results. Make sure you keep the appointment with Dr. Gaynelle Arabian Also keep the follow up appointments about the aneurisms. Everything looks great.

## 2016-07-02 ENCOUNTER — Ambulatory Visit
Admission: RE | Admit: 2016-07-02 | Discharge: 2016-07-02 | Disposition: A | Discharge status: Home / Self Care | Payer: Medicare Other | Source: Ambulatory Visit | Attending: Family Medicine | Admitting: Family Medicine

## 2016-07-02 DIAGNOSIS — I714 Abdominal aortic aneurysm, without rupture, unspecified: Secondary | ICD-10-CM

## 2016-07-02 LAB — LIPID PANEL
CHOL/HDL RATIO: 4.1 ratio (ref 0.0–5.0)
Cholesterol, Total: 201 mg/dL — ABNORMAL HIGH (ref 100–199)
HDL: 49 mg/dL (ref >39)
LDL CALC: 129 mg/dL — AB (ref 0–99)
TRIGLYCERIDES: 113 mg/dL (ref 0–149)
VLDL Cholesterol Cal: 23 mg/dL (ref 5–40)

## 2016-07-02 LAB — BASIC METABOLIC PANEL
BUN / CREAT RATIO: 20 (ref 10–24)
BUN: 24 mg/dL (ref 8–27)
CALCIUM: 9.7 mg/dL (ref 8.6–10.2)
CHLORIDE: 104 mmol/L (ref 96–106)
CO2: 24 mmol/L (ref 18–29)
Creatinine, Ser: 1.23 mg/dL (ref 0.76–1.27)
GFR calc non Af Amer: 56 mL/min/{1.73_m2} — ABNORMAL LOW (ref >59)
GFR, EST AFRICAN AMERICAN: 65 mL/min/{1.73_m2} (ref >59)
Glucose: 98 mg/dL (ref 65–99)
POTASSIUM: 4.6 mmol/L (ref 3.5–5.2)
Sodium: 141 mmol/L (ref 134–144)

## 2016-07-02 MED ORDER — LOVASTATIN 20 MG PO TABS: 20.0000 mg | ORAL_TABLET | Freq: Every day | ORAL | 3 refills | Status: DC

## 2016-07-02 NOTE — Progress Notes (Signed)
   Subjective:    Patient ID: Ryan [name], male    DOB: Sep 05, 1940, 76 y.o.   MRN: [id number]  HPI FU hospitalization for urosepsis with e coli.  Was not fully emptying bladder on arrival.  Now feels great.  Takes last antibiotic today.  Has appointment with urology for FU of BPD/ high post void residuals.  Has follow up appointments with surgeons to follow the thoracic and abd aortic aneurisms.  Hx of high cholesterol.  Has not tolerated statins in th past.  Now off statins.    Review of Systems     Objective:   Physical Exam VS noted Gen looks great Lungs clear Cardiac RRR without m or g Abd benign.        Assessment & Plan:

## 2016-07-02 NOTE — Assessment & Plan Note (Signed)
Resolved.  UA clear today.

## 2016-07-02 NOTE — Assessment & Plan Note (Signed)
Urology follow up for high post void residuals.

## 2016-07-02 NOTE — Assessment & Plan Note (Signed)
LDL up a bit (primary prevention)  Patient intollerant of statins in the past.  Will try again at low dose.

## 2016-07-08 ENCOUNTER — Institutional Professional Consult (permissible substitution) (INDEPENDENT_AMBULATORY_CARE_PROVIDER_SITE_OTHER): Payer: Medicare Other | Admitting: Cardiothoracic Surgery

## 2016-07-08 ENCOUNTER — Encounter: Payer: Self-pay | Admitting: Cardiothoracic Surgery

## 2016-07-08 VITALS — BP 138/75 | HR 51 | Resp 20 | Ht 76.0 in | Wt 200.0 lb

## 2016-07-08 DIAGNOSIS — I7121 Aneurysm of the ascending aorta, without rupture: Secondary | ICD-10-CM

## 2016-07-08 DIAGNOSIS — I712 Thoracic aortic aneurysm, without rupture: Secondary | ICD-10-CM

## 2016-07-08 NOTE — Progress Notes (Signed)
PCP is Hensel, Jamal Collin, MD Referring Provider is Zenia Resides, MD  Chief Complaint  Patient presents with  . Thoracic Aortic Aneurysm    Surgical eval, CTA Chest/ABD/Pelvis 06/22/16    HPI: The patient presents for evaluation of a 4.7 fusiform ascending thoracic aneurysm, asymptomatic. The patient has had 3 chest CTA since December 2017, all showing the aneurysm to measure approximately 4.7 cm. There is no mural thickening, hematoma, or evidence of penetrating ulcer. There is minimal calcification of the thoracic aorta. The aortic arch and descending thoracic aorta are of normal diameter. A recent ultrasound of the abdomen demonstrated the abdominal aorta to have a maximal diameter of approximately 3.4 cm.  The patient is a nonsmoker. The patient denies hypertension. There is no family history of thoracic or abdominal aneurysm surgery or aortic dissection.  The patient is active with bike riding and playing tennis. The patient denies history of cardiac murmur or any cardiac symptoms including palpitations and angina or CHF  The patient is on chronic xarelto for 2 episodes of DVT. Patient was recently placed on Zocor for mild dyslipidemia  Past Medical History:  Diagnosis Date  . Arthritis   . BPH (benign prostatic hyperplasia)   . DVT (deep venous thrombosis) ([place])    x2 "Xarelto use"  . Hyperlipidemia     Past Surgical History:  Procedure Laterality Date  . APPENDECTOMY    . CATARACT EXTRACTION Right   . COLONOSCOPY W/ POLYPECTOMY     multiple  . COLONOSCOPY WITH PROPOFOL N/A 10/30/2014   Procedure: COLONOSCOPY WITH PROPOFOL;  Surgeon: Garlan Fair, MD;  Location: [facility] END[facility]COPY;  Service: Endoscopy;  Laterality: N/A;  . EYE SURGERY     retinal repair bilateral  . HERNIA REPAIR     BIH repair child  . KNEE ARTHROSCOPY Left   . SEPTOPLASTY    . TONSILLECTOMY      Family History  Problem Relation Age of Onset  . Alzheimer's disease Mother   . Arthritis Mother    . Arthritis Father   . Cancer Sister        breast  . Cancer Brother        melanoma  . Stroke Paternal Grandfather     Social History Social History  Substance Use Topics  . Smoking status: Former Smoker    Quit date: 02/10/1980  . Smokeless tobacco: Never Used  . Alcohol use 7.0 oz/week    14 Standard drinks or equivalent per week     Comment:  daily    Current Outpatient Prescriptions  Medication Sig Dispense Refill  . acetaminophen (TYLENOL) 325 MG tablet Take 650 mg by mouth every 6 (six) hours as needed for mild pain.    . Glucosamine-Chondroit-Vit C-Mn (GLUCOSAMINE-CHONDROITIN) TABS Take 1 tablet by mouth daily.    Marland Kitchen lovastatin (MEVACOR) 20 MG tablet Take 1 tablet (20 mg total) by mouth at bedtime. 90 tablet 3  . Multiple Vitamin (MULTIVITAMIN WITH MINERALS) TABS tablet Take 1 tablet by mouth daily.    . tamsulosin (FLOMAX) 0.4 MG CAPS capsule TAKE (1) CAPSULE DAILY. 90 capsule 3  . traZODone (DESYREL) 50 MG tablet Take 50 mg by mouth at bedtime as needed for sleep.    Alveda Reasons 20 MG TABS tablet TAKE 1 TABLET ONCE DAILY WITH SUPPER. 90 tablet 3   No current facility-administered medications for this visit.     Allergies  Allergen Reactions  . Simvastatin Itching    REACTION: Low level CK elevation  .  Warfarin Sodium Rash    Review of Systems  Patient has had previous surgery including appendectomy bilateral inguinal hernia repair and left eye cataract surgery.  The patient denies radius thoracic surgery or trauma or pneumothorax. Patient stopped smoking 36 years ago and drinks 1-2 beers per day.  The patient apparently did have a presyncopal episode preceding one of his CT scans.  He has had no bleeding problems from the chronic anticoagulation therapy for DVT Otherwise review of systems is noncontributory  BP 138/75   Pulse (!) 51   Resp 20   Ht 6\' 4"  (1.93 m)   Wt 200 lb (90.7 kg)   SpO2 97% Comment: RA  BMI 24.34 kg/m  Physical Exam       Physical Exam  General: Very intelligent 76 year old Caucasian male well-nourished who appears healthy and no distress HEENT: Normocephalic pupils equal , dentition adequate Neck: Supple without JVD, adenopathy, or bruit Chest: Clear to auscultation, symmetrical breath sounds, no rhonchi, no tenderness             or deformity Cardiovascular: Regular rate and rhythm, 2/6 aortic stenosis murmur, no gallop, peripheral pulses             palpable in all extremities Abdomen:  Soft, nontender, no palpable mass or organomegaly Extremities: Warm, well-perfused, no clubbing cyanosis edema or tenderness,              no venous stasis changes of the legs Rectal/GU: Deferred Neuro: Grossly non--focal and symmetrical throughout Skin: Clean and dry without rash or ulceration   Diagnostic Tests: CT scan images personally reviewed and counseled with patient No change in the diameter of the descending order from December 2017  To current CT scan 6 months later. Mid ascending fusiform ascending aneurysm 4.7 cm  Impression: Asymptomatic fusiform aneurysm Will need to be followed with serial CT scans, at this point annually Risk for aortic dissection at the current size is less than 1% per year Surgery not recommended by current guidelines until diameter exceeds 5.5 cm Best therapy to prevent further dilatation aorta is continued careful blood pressure assessment and control, continued with statin therapy, and moderate exercise and weight control.  I would not anticipate that this patient would never need surgery but he should take close attention to his blood pressure as some determinations have been recorded as systolic pressure greater than 150.   The patient will return in one year with CTA of the thoracic aorta. We will also arrange for a echocardiogram to assess his aortic valve for his murmur of possible aortic stenosis.     Len Childs, MD [facility] [phone]

## 2016-07-28 DIAGNOSIS — N3000 Acute cystitis without hematuria: Secondary | ICD-10-CM | POA: Diagnosis not present

## 2016-07-28 DIAGNOSIS — N401 Enlarged prostate with lower urinary tract symptoms: Secondary | ICD-10-CM | POA: Diagnosis not present

## 2016-08-31 ENCOUNTER — Other Ambulatory Visit: Payer: Self-pay | Admitting: Family Medicine

## 2016-12-08 ENCOUNTER — Other Ambulatory Visit: Payer: Self-pay | Admitting: Family Medicine

## 2016-12-08 DIAGNOSIS — I712 Thoracic aortic aneurysm, without rupture, unspecified: Secondary | ICD-10-CM

## 2016-12-08 NOTE — Progress Notes (Signed)
email exchange. Ordered.  And you will need some blood work prior to the CT to be sure OK to give dye.  Our office will do the blood work - please call before coming in.  Someone should call about the echo and CT scheduling.  Ryan A. Andria Frames, MD Director, [facility] Direct Office # [phone] Page # [phone] Mobile # [phone]  From: [name]>  Sent: Tuesday, December 08, 2016 5:26 PM To: Hensel, Bill [email or web address]> Subject: Re: [External Email]Follow up CTScan  *Caution - External Email* If you concur, I would appreciate your assistance in scheduling.  Can you order, with the provider then contacting me for scheduling?  Best number is [phone] since we recently moved.  Thanks for your help.   Ryan [name]  On Tue, Dec 08, 2016 at 11:51 AM Hensel, Bill [email or web address]> wrote: Yes, you are correct.  Your last scan was 06/22/16.  The recommended follow up was six months.  And it was recommended that you see the thoracic surgeons, which you did.  It looks like Dr. Lucianne Lei Tright wanted you to get an echocardiogram to assess your aortic valve.  I can't see that it was done.  By my read, you need an echocardiogram now and another CT of your chest mid November.  Do you want me to go ahead and order both? Ryan [name]   Here is the full recommendation: CT angiogram chest: Ascending thoracic aortic diameter measures 4.7 x 4.5 cm, unchanged from recent study. No thoracic aortic dissection evident. Ascending thoracic aortic aneurysm. Recommend semi-annual imaging followup by CTA or MRA and referral to cardiothoracic surgery if not already obtained. This recommendation follows 2010 ACCF/AHA/AATS/ACR/ASA/SCA/SCAI/SIR/STS/SVM Guidelines for the Diagnosis and Management of Patients With Thoracic Aortic Disease. Circulation. 2010; 121: N867-E720.   Ryan A. Andria Frames, MD Director, [facility] Direct Office #  [phone] Page # [phone] Mobile # [phone]   From: [name]>  Sent: Sunday, December 06, 2016 8:21 PM To: Hensel, Bill [email or web address]> Subject: [External Email]Follow up CTScan   *Caution - External Email* I have a "reminder" on my calendar.  Do I need to get a follow CT scan?  I suppose related to the aneurysm discovered during my last bout with the UTI and Sepsis.  Please advise.  Many thanks. Ryan [name] --  [name] '[facility] [address] [place], [place]  [address] [phone] (cell) [name]

## 2017-01-02 DIAGNOSIS — J069 Acute upper respiratory infection, unspecified: Secondary | ICD-10-CM | POA: Diagnosis not present

## 2017-01-05 ENCOUNTER — Other Ambulatory Visit: Payer: Medicare Other

## 2017-01-05 DIAGNOSIS — I712 Thoracic aortic aneurysm, without rupture, unspecified: Secondary | ICD-10-CM

## 2017-01-06 ENCOUNTER — Other Ambulatory Visit: Payer: Self-pay

## 2017-01-06 ENCOUNTER — Ambulatory Visit (HOSPITAL_COMMUNITY): Payer: Medicare Other | Attending: Cardiology

## 2017-01-06 DIAGNOSIS — I712 Thoracic aortic aneurysm, without rupture, unspecified: Secondary | ICD-10-CM

## 2017-01-06 DIAGNOSIS — E785 Hyperlipidemia, unspecified: Secondary | ICD-10-CM | POA: Diagnosis not present

## 2017-01-06 DIAGNOSIS — I082 Rheumatic disorders of both aortic and tricuspid valves: Secondary | ICD-10-CM | POA: Diagnosis not present

## 2017-01-06 LAB — BASIC METABOLIC PANEL
BUN/Creatinine Ratio: 14 (ref 10–24)
BUN: 17 mg/dL (ref 8–27)
CALCIUM: 9.6 mg/dL (ref 8.6–10.2)
CO2: 24 mmol/L (ref 20–29)
Chloride: 102 mmol/L (ref 96–106)
Creatinine, Ser: 1.18 mg/dL (ref 0.76–1.27)
GFR calc Af Amer: 68 mL/min/{1.73_m2} (ref >59)
GFR, EST NON AFRICAN AMERICAN: 59 mL/min/{1.73_m2} — AB (ref >59)
Glucose: 102 mg/dL — ABNORMAL HIGH (ref 65–99)
POTASSIUM: 3.9 mmol/L (ref 3.5–5.2)
Sodium: 142 mmol/L (ref 134–144)

## 2017-01-08 ENCOUNTER — Ambulatory Visit
Admission: RE | Admit: 2017-01-08 | Discharge: 2017-01-08 | Disposition: A | Discharge status: Home / Self Care | Payer: Medicare Other | Source: Ambulatory Visit | Attending: Family Medicine | Admitting: Family Medicine

## 2017-01-08 ENCOUNTER — Other Ambulatory Visit: Payer: Self-pay | Admitting: Family Medicine

## 2017-01-08 DIAGNOSIS — I712 Thoracic aortic aneurysm, without rupture, unspecified: Secondary | ICD-10-CM

## 2017-01-08 DIAGNOSIS — I700 Atherosclerosis of aorta: Secondary | ICD-10-CM | POA: Diagnosis not present

## 2017-01-08 MED ORDER — IOPAMIDOL (ISOVUE-370) INJECTION 76%
75.0000 mL | Freq: Once | INTRAVENOUS | Status: AC | PRN
  Administered 2017-01-08: 75 mL via INTRAVENOUS

## 2017-01-11 ENCOUNTER — Other Ambulatory Visit: Payer: Self-pay

## 2017-01-11 ENCOUNTER — Encounter: Payer: Self-pay | Admitting: Family Medicine

## 2017-01-11 ENCOUNTER — Ambulatory Visit (INDEPENDENT_AMBULATORY_CARE_PROVIDER_SITE_OTHER): Payer: Medicare Other | Admitting: Internal Medicine

## 2017-01-11 DIAGNOSIS — R0981 Nasal congestion: Secondary | ICD-10-CM | POA: Diagnosis not present

## 2017-01-11 DIAGNOSIS — I350 Nonrheumatic aortic (valve) stenosis: Secondary | ICD-10-CM | POA: Insufficient documentation

## 2017-01-11 DIAGNOSIS — R911 Solitary pulmonary nodule: Secondary | ICD-10-CM | POA: Insufficient documentation

## 2017-01-11 MED ORDER — OXYMETAZOLINE HCL 0.05 % NA SOLN: 1.0000 | Freq: Two times a day (BID) | NASAL | 0 refills | Status: DC

## 2017-01-11 NOTE — Patient Instructions (Signed)
It was nice meeting you today Mr. Gayle!  For your nasal congestion, you can use Afrin nasal spray up to two times a day. DO NOT USE FOR MORE THAN THREE DAYS IN A ROW.   You can continue to use over the counter medications if you feel these are helpful.   Be sure to drink plenty of water.   If you have any questions or concerns, please feel free to call the clinic.   Be well,  Dr. Avon Gully

## 2017-01-11 NOTE — Assessment & Plan Note (Signed)
Likely viral etiology. With accompanying cough. No history of seasonal allergies, and accompanying productive cough makes allergies less likely. Lungs CTAB. No nasal abnormalities noted. Afebrile, well-hydrated, and well-appearing on exam. Will treat with Afrin. Stressed importance of using for no more than three days. Offered Tessalon perles for cough, however patient said he will continue to use OTC products if necessary as cough is not very bothersome to him.

## 2017-01-11 NOTE — Progress Notes (Signed)
   Subjective:   Patient: Ryan [name]       Birthdate: 15-Feb-1940       MRN: [id number]      HPI  SHIHEEM CORPORAN is a 76 y.o. male presenting for walk in appt for nasal congestion.   Nasal congestion Began about 12 days ago. Has tried Dayquil and Nyquil and says that has been only mildly effective. Accompanying "minor" cough that is not particularly troublesome to patient. Cough productive of yellow sputum, no blood. Denies sore throat. Denies change in appetite. Says he is drinking water but probably not enough. Denies difficulty breathing, wheezing. No fevers. No diarrhea, nausea, vomiting. No sick contacts. Got flu shot a month ago.   Smoking status reviewed. Patient is former smoker.   Review of Systems See HPI.     Objective:  Physical Exam  Constitutional: He is oriented to person, place, and time and well-developed, well-nourished, and in no distress.  HENT:  Head: Normocephalic and atraumatic.  Nose: Nose normal.  No tonsillar erythema or exudates. MMM.   Neck: Normal range of motion. Neck supple.  Cardiovascular: Normal rate, regular rhythm and normal heart sounds.  No murmur heard. Pulmonary/Chest: Effort normal and breath sounds normal. No respiratory distress. He has no wheezes.  Lymphadenopathy:    He has no cervical adenopathy.  Neurological: He is alert and oriented to person, place, and time.  Skin: Skin is dry.  Psychiatric: Affect and judgment normal.      Assessment & Plan:  Nasal congestion Likely viral etiology. With accompanying cough. No history of seasonal allergies, and accompanying productive cough makes allergies less likely. Lungs CTAB. No nasal abnormalities noted. Afebrile, well-hydrated, and well-appearing on exam. Will treat with Afrin. Stressed importance of using for no more than three days. Offered Tessalon perles for cough, however patient said he will continue to use OTC products if necessary as cough is not very bothersome to him.     Adin Hector, MD, MPH PGY-3 [facility] Pager [phone]

## 2017-01-13 ENCOUNTER — Other Ambulatory Visit: Payer: Self-pay | Admitting: Family Medicine

## 2017-01-13 DIAGNOSIS — H33312 Horseshoe tear of retina without detachment, left eye: Secondary | ICD-10-CM | POA: Diagnosis not present

## 2017-01-13 DIAGNOSIS — Z961 Presence of intraocular lens: Secondary | ICD-10-CM | POA: Diagnosis not present

## 2017-01-13 DIAGNOSIS — H26492 Other secondary cataract, left eye: Secondary | ICD-10-CM | POA: Diagnosis not present

## 2017-01-13 DIAGNOSIS — H02403 Unspecified ptosis of bilateral eyelids: Secondary | ICD-10-CM | POA: Diagnosis not present

## 2017-01-13 DIAGNOSIS — Z9842 Cataract extraction status, left eye: Secondary | ICD-10-CM | POA: Diagnosis not present

## 2017-03-09 ENCOUNTER — Encounter: Payer: Self-pay | Admitting: Family Medicine

## 2017-03-09 ENCOUNTER — Ambulatory Visit (INDEPENDENT_AMBULATORY_CARE_PROVIDER_SITE_OTHER): Payer: Medicare Other | Admitting: Family Medicine

## 2017-03-09 ENCOUNTER — Other Ambulatory Visit: Payer: Self-pay

## 2017-03-09 DIAGNOSIS — R0982 Postnasal drip: Secondary | ICD-10-CM | POA: Diagnosis not present

## 2017-03-09 MED ORDER — FLUTICASONE PROPIONATE 50 MCG/ACT NA SUSP: 2.0000 | Freq: Every day | NASAL | 6 refills | Status: DC

## 2017-03-09 MED ORDER — MONTELUKAST SODIUM 10 MG PO TABS: 10.0000 mg | ORAL_TABLET | Freq: Every day | ORAL | 3 refills | Status: DC

## 2017-03-09 NOTE — Assessment & Plan Note (Signed)
77 year old male window endorsing nasal congestion/stuffy nose/cough with slight amount of phlegm for the last 9 weeks.  History and exam most consistent with postnasal drip versus allergic rhinitis.  Discussed treatment options with patient.  Also discussed differentials including silent GERD.  Patient's lungs are clear to auscultation bilaterally and he is afebrile, very unlikely infectious etiology such as pneumonia or bronchitis. -Begin twice daily Flonase - Will start Singulair 10 mg daily -Follow-up in 2 weeks if no improvement

## 2017-03-09 NOTE — Progress Notes (Signed)
Subjective:    Patient ID: Ryan [name] , male   DOB: 22-Mar-1940 , 77 y.o..   MRN: [id number]  HPI  Ryan [name] is here for  Chief Complaint  Patient presents with  . Cold Exposure    going on for 9 weeks    1. Cough/nasal congestion/runny nose  Onset: 9 weeks ago  Course: began 9 weeks ago after cold and flu symptoms. He has had intermittent symptoms, one day he will have a cough  Worse with: Nothing identified Better with: Over-the-counter Robitussin cough  Symptoms Sputum:yes, slight phlegm Fever: no  Shortness of breath:no  Leg Swelling:no  Heart Burn or Reflux:no  Wheezing:no  Post Nasal Drip: yes   Red Flags Weight Loss:  no Immunocompromised:  no  PMH Asthma or COPD: no  PMH of Smoking: yes, smoked 30 years ago Using ACEIs: no    Review of Systems: Per HPI.   Past Medical History: Patient Active Problem List   Diagnosis Date Noted  . Post-nasal drip 03/09/2017  . Nasal congestion 01/11/2017  . Mild aortic stenosis 01/11/2017  . Solitary pulmonary nodule 01/11/2017  . AAA (abdominal aortic aneurysm) without rupture ([place]) 06/11/2016  . Syncope 02/14/2016  . Thoracic aortic aneurysm without rupture ([place]) 02/14/2016  . Insomnia 02/13/2016  . Rotator cuff syndrome of right shoulder 10/18/2015  . Polyarthralgia 10/17/2015  . Infection of urinary tract 08/07/2015  . Presbycusis 07/16/2015  . Headache 11/14/2014  . Routine general medical examination at a health care facility 06/23/2012  . Metatarsalgia 05/06/2011  . Tear of meniscus of left knee 09/16/2010  . Benign prostatic hyperplasia 06/05/2010  . Bradycardia 07/05/2009  . Malaise and fatigue 07/05/2009  . COLONIC POLYPS 04/06/2008  . DEGENERATIVE JOINT DISEASE, AXIAL SKELETON 06/10/2006  . Pure hypercholesterolemia 04/08/2006  . RESTLESS LEGS SYNDROME 04/08/2006  . History of DVT (deep vein thrombosis) 04/08/2006  . VENOUS INSUFFICIENCY, CHRONIC 04/08/2006  . BACK PAIN, LOW 04/08/2006      Medications: reviewed   Social Hx:  reports that he quit smoking about 37 years ago. he has never used smokeless tobacco.   Objective:   BP 102/60   Pulse (!) 54   Temp (!) 92 F (33.3 C)   Wt 194 lb 12.8 oz (88.4 kg)   SpO2 92%   BMI 23.71 kg/m  Physical Exam  Gen: NAD, alert, cooperative with exam, well-appearing HEENT:     Head: Normocephalic, atraumatic    Neck: No masses palpated. No goiter. No lymphadenopathy     Ears: External ears normal, no drainage.Tympanic membranes intact, normal light reflex bilaterally, no erythema or bulging    Eyes: PERRLA, EOMI, sclera white, normal conjunctiva    Nose: nasal turbinates moist and swollen    Throat: moist mucus membranes, no pharyngeal erythema, no tonsillar exudate. Airway is patent Cardiac: Regular rate and rhythm, normal S1/S2, no murmur, no edema, capillary refill brisk  Respiratory: Clear to auscultation bilaterally, no wheezes, non-labored breathing Gastrointestinal: soft, non tender, non distended, bowel sounds present Skin: no rashes, normal turgor  Neurological: no gross deficits.  Psych: good insight, normal mood and affect  Assessment & Plan:  Post-nasal drip 77 year old male window endorsing nasal congestion/stuffy nose/cough with slight amount of phlegm for the last 9 weeks.  History and exam most consistent with postnasal drip versus allergic rhinitis.  Discussed treatment options with patient.  Also discussed differentials including silent GERD.  Patient's lungs are clear to auscultation bilaterally and he is afebrile, very unlikely  infectious etiology such as pneumonia or bronchitis. -Begin twice daily Flonase - Will start Singulair 10 mg daily -Follow-up in 2 weeks if no improvement   Meds ordered this encounter  Medications  . fluticasone (FLONASE) 50 MCG/ACT nasal spray    Sig: Place 2 sprays into both nostrils daily.    Dispense:  16 g    Refill:  6  . montelukast (SINGULAIR) 10 MG tablet    Sig:  Take 1 tablet (10 mg total) by mouth at bedtime.    Dispense:  30 tablet    Refill:  3    Smitty Cords, MD [place], PGY-3

## 2017-03-09 NOTE — Patient Instructions (Signed)
Thank you for coming in today, it was so nice to see you! Today we talked about:    Cold symptoms: This is likely from allergic rhinitis/post nasal drip. I have prescribed you a medication to help with your symptoms as well as a nose spray. Use these daily as prescribed. Your lungs sound clear, no signs of pneumonia or bronchitis.   Please come to the clinic sooner if you have any shortness of breath, wheezing, or fevers.   Please follow up in 2 weeks. You can schedule this appointment at the front desk before you leave or call the clinic.  Bring in all your medications or supplements to each appointment for review.    If you have any questions or concerns, please do not hesitate to call the office at [phone]. You can also message me directly via MyChart.   Sincerely,  Smitty Cords, MD

## 2017-04-21 ENCOUNTER — Other Ambulatory Visit: Payer: Self-pay | Admitting: Family Medicine

## 2017-05-26 ENCOUNTER — Other Ambulatory Visit: Payer: Self-pay | Admitting: *Deleted

## 2017-05-26 DIAGNOSIS — I712 Thoracic aortic aneurysm, without rupture, unspecified: Secondary | ICD-10-CM

## 2017-06-08 ENCOUNTER — Telehealth: Payer: Self-pay | Admitting: *Deleted

## 2017-06-09 ENCOUNTER — Encounter: Payer: Self-pay | Admitting: Family Medicine

## 2017-06-09 ENCOUNTER — Other Ambulatory Visit: Payer: Self-pay

## 2017-06-09 ENCOUNTER — Ambulatory Visit (INDEPENDENT_AMBULATORY_CARE_PROVIDER_SITE_OTHER): Payer: Medicare Other | Admitting: Family Medicine

## 2017-06-09 DIAGNOSIS — Z7901 Long term (current) use of anticoagulants: Secondary | ICD-10-CM | POA: Diagnosis not present

## 2017-06-09 DIAGNOSIS — I714 Abdominal aortic aneurysm, without rupture, unspecified: Secondary | ICD-10-CM

## 2017-06-09 DIAGNOSIS — I712 Thoracic aortic aneurysm, without rupture, unspecified: Secondary | ICD-10-CM

## 2017-06-09 DIAGNOSIS — R911 Solitary pulmonary nodule: Secondary | ICD-10-CM | POA: Diagnosis not present

## 2017-06-09 DIAGNOSIS — D126 Benign neoplasm of colon, unspecified: Secondary | ICD-10-CM | POA: Diagnosis not present

## 2017-06-09 DIAGNOSIS — E7800 Pure hypercholesterolemia, unspecified: Secondary | ICD-10-CM | POA: Diagnosis not present

## 2017-06-09 NOTE — Patient Instructions (Addendum)
Please get your blood drawn any time after 07/01/17. Also get your blood drawn before your chest CT.  They will give you dye and will want to know your kidney. They will be checking on your thoracic aneurysm and the small spot they found last time.  Your chest aneurysm is pretty good sized and there is a decent chance you will need surgery in the next several years. You also have a small abdominal aortic aneurysm.  You are next due to an ultrasound in May of 2020.  It is small enough that it will likely never bother you. Have fun in [place]. See me if you have worrisome memory problems.   Do schedule the Chest CT with the specialist.

## 2017-06-10 ENCOUNTER — Other Ambulatory Visit (HOSPITAL_COMMUNITY): Payer: Medicare Other

## 2017-06-10 ENCOUNTER — Encounter: Payer: Self-pay | Admitting: Family Medicine

## 2017-06-10 NOTE — Assessment & Plan Note (Signed)
Check CBC 

## 2017-06-10 NOTE — Assessment & Plan Note (Signed)
Will consider risk benefit before automatically ordering colonoscopy in 2021

## 2017-06-10 NOTE — Progress Notes (Signed)
   Subjective:    Patient ID: Ryan [name], male    DOB: Jan 06, 1941, 77 y.o.   MRN: [id number]  HPI FU of several issues: 1. Cough lingered for a long time after LRI.  Now resolved. 2. Thoracic aortic aneurism.  4.7 cm.  Emphasized he needs FU q6 months. 3.  65mm new pulm nodule seen on most recent CT.  Remote smoker makes him low risk.  Will be followed with CT for TAA. 4. AAA need 2 year FU. Due may 2020. 5. Memory issues:  Per patient, seem to be normal issues with aging.  No worrisome symptoms of dementia. 6. Colon polyp follow up.  Given age and health, discussed follow up.  Had small polyp previously, but most recent colonoscopy was normal. 7. Soon due for annual cholesterol screen.    Review of Systems     Objective:   Physical Exam  Lungs clear Cardiac RRR without m or g Abd benign.        Assessment & Plan:

## 2017-06-10 NOTE — Assessment & Plan Note (Signed)
Will be seen with TAA follow up.

## 2017-06-10 NOTE — Assessment & Plan Note (Signed)
Due for annual screen. 

## 2017-06-10 NOTE — Assessment & Plan Note (Signed)
Due for FU in 2020

## 2017-06-10 NOTE — Assessment & Plan Note (Signed)
Emphasized need for FU

## 2017-07-02 ENCOUNTER — Other Ambulatory Visit: Payer: Medicare Other

## 2017-07-02 DIAGNOSIS — Z7901 Long term (current) use of anticoagulants: Secondary | ICD-10-CM | POA: Diagnosis not present

## 2017-07-02 DIAGNOSIS — E7800 Pure hypercholesterolemia, unspecified: Secondary | ICD-10-CM

## 2017-07-03 LAB — LIPID PANEL
CHOLESTEROL TOTAL: 169 mg/dL (ref 100–199)
Chol/HDL Ratio: 2.9 ratio (ref 0.0–5.0)
HDL: 59 mg/dL (ref >39)
LDL Calculated: 83 mg/dL (ref 0–99)
Triglycerides: 133 mg/dL (ref 0–149)
VLDL Cholesterol Cal: 27 mg/dL (ref 5–40)

## 2017-07-03 LAB — CMP14+EGFR
A/G RATIO: 1.8 (ref 1.2–2.2)
ALBUMIN: 4.4 g/dL (ref 3.5–4.8)
ALK PHOS: 70 IU/L (ref 39–117)
ALT: 25 IU/L (ref 0–44)
AST: 25 IU/L (ref 0–40)
BUN / CREAT RATIO: 17 (ref 10–24)
BUN: 21 mg/dL (ref 8–27)
Bilirubin Total: 0.6 mg/dL (ref 0.0–1.2)
CHLORIDE: 109 mmol/L — AB (ref 96–106)
CO2: 20 mmol/L (ref 20–29)
Calcium: 9.5 mg/dL (ref 8.6–10.2)
Creatinine, Ser: 1.25 mg/dL (ref 0.76–1.27)
GFR calc non Af Amer: 54 mL/min/{1.73_m2} — ABNORMAL LOW (ref >59)
GFR, EST AFRICAN AMERICAN: 63 mL/min/{1.73_m2} (ref >59)
GLUCOSE: 134 mg/dL — AB (ref 65–99)
Globulin, Total: 2.4 g/dL (ref 1.5–4.5)
POTASSIUM: 4.4 mmol/L (ref 3.5–5.2)
SODIUM: 144 mmol/L (ref 134–144)
TOTAL PROTEIN: 6.8 g/dL (ref 6.0–8.5)

## 2017-07-03 LAB — CBC
HEMOGLOBIN: 15.4 g/dL (ref 13.0–17.7)
Hematocrit: 47.2 % (ref 37.5–51.0)
MCH: 32.1 pg (ref 26.6–33.0)
MCHC: 32.6 g/dL (ref 31.5–35.7)
MCV: 98 fL — AB (ref 79–97)
Platelets: 208 10*3/uL (ref 150–450)
RBC: 4.8 x10E6/uL (ref 4.14–5.80)
RDW: 13.3 % (ref 12.3–15.4)
WBC: 5.1 10*3/uL (ref 3.4–10.8)

## 2017-07-06 ENCOUNTER — Ambulatory Visit (HOSPITAL_COMMUNITY): Payer: Medicare Other | Attending: Cardiology

## 2017-07-06 ENCOUNTER — Other Ambulatory Visit: Payer: Self-pay

## 2017-07-06 DIAGNOSIS — I712 Thoracic aortic aneurysm, without rupture, unspecified: Secondary | ICD-10-CM

## 2017-07-06 DIAGNOSIS — I272 Pulmonary hypertension, unspecified: Secondary | ICD-10-CM | POA: Insufficient documentation

## 2017-07-06 DIAGNOSIS — I083 Combined rheumatic disorders of mitral, aortic and tricuspid valves: Secondary | ICD-10-CM | POA: Diagnosis not present

## 2017-07-07 ENCOUNTER — Other Ambulatory Visit: Payer: Self-pay | Admitting: Family Medicine

## 2017-07-07 ENCOUNTER — Encounter: Payer: Self-pay | Admitting: Cardiothoracic Surgery

## 2017-07-07 ENCOUNTER — Other Ambulatory Visit (INDEPENDENT_AMBULATORY_CARE_PROVIDER_SITE_OTHER): Payer: Medicare Other

## 2017-07-07 ENCOUNTER — Ambulatory Visit
Admission: RE | Admit: 2017-07-07 | Discharge: 2017-07-07 | Disposition: A | Discharge status: Home / Self Care | Payer: Medicare Other | Source: Ambulatory Visit | Attending: Cardiothoracic Surgery | Admitting: Cardiothoracic Surgery

## 2017-07-07 ENCOUNTER — Other Ambulatory Visit: Payer: Self-pay

## 2017-07-07 ENCOUNTER — Ambulatory Visit: Payer: Medicare Other | Admitting: Cardiothoracic Surgery

## 2017-07-07 VITALS — BP 118/72 | HR 79 | Resp 18 | Ht 76.0 in | Wt 190.6 lb

## 2017-07-07 DIAGNOSIS — I714 Abdominal aortic aneurysm, without rupture, unspecified: Secondary | ICD-10-CM

## 2017-07-07 DIAGNOSIS — I712 Thoracic aortic aneurysm, without rupture, unspecified: Secondary | ICD-10-CM

## 2017-07-07 DIAGNOSIS — R739 Hyperglycemia, unspecified: Secondary | ICD-10-CM

## 2017-07-07 LAB — POCT GLYCOSYLATED HEMOGLOBIN (HGB A1C): Hemoglobin A1C: 5.1 % (ref 4.0–5.6)

## 2017-07-07 MED ORDER — IOPAMIDOL (ISOVUE-370) INJECTION 76%
75.0000 mL | Freq: Once | INTRAVENOUS | Status: AC | PRN
  Administered 2017-07-07: 75 mL via INTRAVENOUS

## 2017-07-07 NOTE — Progress Notes (Signed)
PCP is Hensel, Jamal Collin, MD Referring Provider is Zenia Resides, MD  Chief Complaint  Patient presents with  . Thoracic Aortic Aneurysm    yearly f/u with Chest CTA    HPI: Patient returns for one-year follow-up of a stable asymptomatic 4.6 cm fusiform ascending aneurysm.  This was first noted in early 2018.  Last CT scan was 6 months ago.  He remains asymptomatic. The CTA images are personally and independently reviewed and counseled with patient.  The aorta has no evidence of mural thickening or thrombus, no evidence of ulcerated plaque or  heavy calcification Patient denies any significant chest or upper back pain.  On the previous CT scan there is a 7 mm right middle lobe nodule which has resolved on the current CT scan.  No at risk mediastinal adenopathy noted  On the patient's last exam a systolic murmur was noted and echocardiogram has recently been performed showing no evidence of significant aortic stenosis, the valve appears tricuspid with some mild thickening which would account for the murmur.  LV function appears to be normal.  Patient remains very active and plays tennis at least 3 days a week.  He is not hypertensive.  He takes Mevacor for lipid control.  He does not smoke.   Past Medical History:  Diagnosis Date  . Arthritis   . BPH (benign prostatic hyperplasia)   . DVT (deep venous thrombosis) ([place])    x2 "Xarelto use"  . Hyperlipidemia     Past Surgical History:  Procedure Laterality Date  . APPENDECTOMY    . CATARACT EXTRACTION Right   . COLONOSCOPY W/ POLYPECTOMY     multiple  . COLONOSCOPY WITH PROPOFOL N/A 10/30/2014   Procedure: COLONOSCOPY WITH PROPOFOL;  Surgeon: Garlan Fair, MD;  Location: [facility] ENDOSCOPY;  Service: Endoscopy;  Laterality: N/A;  . EYE SURGERY     retinal repair bilateral  . HERNIA REPAIR     BIH repair child  . KNEE ARTHROSCOPY Left   . SEPTOPLASTY    . TONSILLECTOMY      Family History  Problem Relation Age of Onset   . Alzheimer's disease Mother   . Arthritis Mother   . Arthritis Father   . Cancer Sister        breast  . Cancer Brother        melanoma  . Stroke Paternal Grandfather     Social History Social History   Tobacco Use  . Smoking status: Former Smoker    Last attempt to quit: 02/10/1980    Years since quitting: 37.4  . Smokeless tobacco: Never Used  Substance Use Topics  . Alcohol use: Yes    Alcohol/week: 7.0 oz    Types: 14 Standard drinks or equivalent per week    Comment:  daily  . Drug use: No    Current Outpatient Medications  Medication Sig Dispense Refill  . acetaminophen (TYLENOL) 325 MG tablet Take 650 mg by mouth every 6 (six) hours as needed for mild pain.    . clonazePAM (KLONOPIN) 1 MG tablet 1 mg nightly.    . fluticasone (FLONASE) 50 MCG/ACT nasal spray Place 2 sprays into both nostrils daily. 16 g 6  . Glucosamine-Chondroit-Vit C-Mn (GLUCOSAMINE-CHONDROITIN) TABS Take 1 tablet by mouth daily.    Marland Kitchen lovastatin (MEVACOR) 20 MG tablet Take 1 tablet (20 mg total) by mouth at bedtime. 90 tablet 3  . montelukast (SINGULAIR) 10 MG tablet Take 1 tablet (10 mg total) by mouth at bedtime.  30 tablet 3  . Multiple Vitamin (MULTIVITAMIN WITH MINERALS) TABS tablet Take 1 tablet by mouth daily.    . tamsulosin (FLOMAX) 0.4 MG CAPS capsule TAKE (1) CAPSULE DAILY. 90 capsule 3  . traZODone (DESYREL) 50 MG tablet TAKE 1 TABLET AT BEDTIME AS NEEDED FOR SLEEP. MAY REPEAT ONCE IF STILL AWAKE. 100 tablet 3  . XARELTO 20 MG TABS tablet TAKE 1 TABLET ONCE DAILY WITH SUPPER. 90 tablet 3   No current facility-administered medications for this visit.     Allergies  Allergen Reactions  . Simvastatin Itching    REACTION: Low level CK elevation  . Warfarin Sodium Rash    Review of Systems  Weight slightly down approximately 5 pounds No syncope or headache No dental complaints or difficulty swallowing No chest pain or shortness of breath or productive cough No GI complaints or  abdominal pain No bleeding complications from Xarelto No calf swelling or tenderness  BP 118/72 (BP Location: Right Arm, Patient Position: Sitting, Cuff Size: Normal)   Pulse 79   Resp 18   Ht 6\' 4"  (1.93 m)   Wt 190 lb 9.6 oz (86.5 kg)   SpO2 95% Comment: RA  BMI 23.20 kg/m  Physical Exam      Exam    General- alert and comfortable    Neck- no JVD, no cervical adenopathy palpable, no carotid bruit   Lungs- clear without rales, wheezes   Cor- regular rate and rhythm, no murmur , gallop   Abdomen- soft, non-tender   Extremities - warm, non-tender, minimal edema   Neuro- oriented, appropriate, no focal weakness  Diagnostic Tests: CTA images personally reviewed and counseled with patient which show no change in the stable fusiform 4.6 cm ascending aneurysm  Impression: Risk of dissection remains very low, probably less than 1%.  The aorta will need to be monitored with CT scans but we will increase the interval out to 12 months.  Plan: Return in 12 months with CTA of thoracic aorta.  Otherwise continue current activities and medications   Len Childs, MD [facility] [phone]

## 2017-07-21 ENCOUNTER — Encounter: Payer: Medicare Other | Admitting: Cardiothoracic Surgery

## 2017-08-01 ENCOUNTER — Other Ambulatory Visit: Payer: Self-pay | Admitting: Family Medicine

## 2017-08-02 ENCOUNTER — Other Ambulatory Visit: Payer: Self-pay | Admitting: Family Medicine

## 2017-08-02 NOTE — Telephone Encounter (Signed)
Refill request by pharm.  Called patient: no allergies and post nasal drip has resoloved.  He knows that I refused the refill and that he no longer needs this medication.

## 2017-08-12 IMAGING — MR MR HEAD W/O CM
8 of 10 series · 38 of 48 positions shown · non-contrast
Comparison: Head CT without contrast 10/31/2014.

CLINICAL DATA: 74-year-old male with left side headaches since
colonoscopy 6 weeks ago. New daily persistent headache. Initial
encounter.

EXAM:
MRI HEAD WITHOUT CONTRAST
TECHNIQUE: Multiplanar, multiecho pulse sequences of the brain and surrounding
structures were obtained without intravenous contrast.

[Series 3: T1 · sagittal · 5.0mm · 0.47mm/px · 1 of 24 slices shown]
[im 1/24]
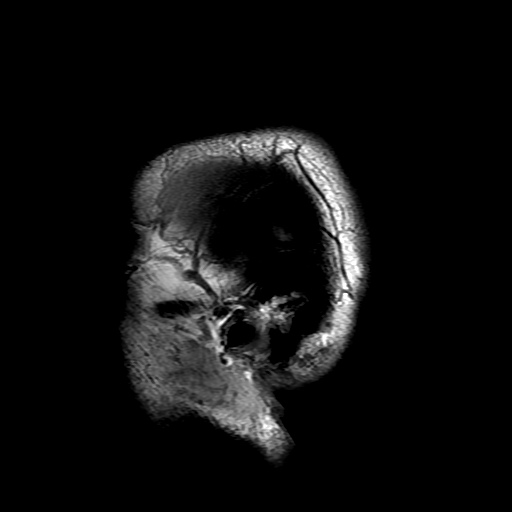

[Series 4: DWI · axial · 3.0mm · 1.09mm/px · z∈[-64,+101]mm · 11 of 112 slices shown (1 of 4)]
[im 1/112]
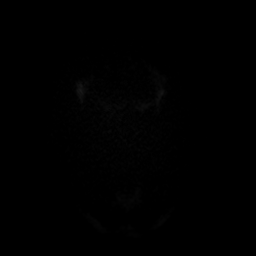
[im 12/112]
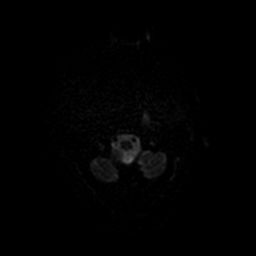
[im 23/112]
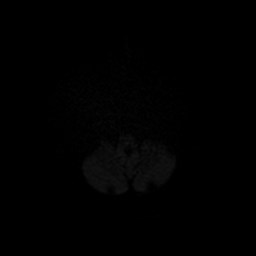
[im 34/112]
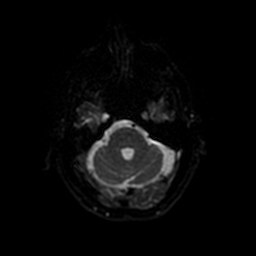
[im 45/112]
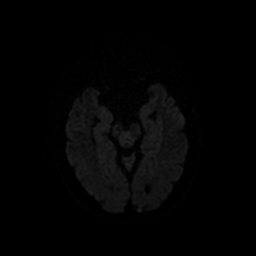
[im 56/112]
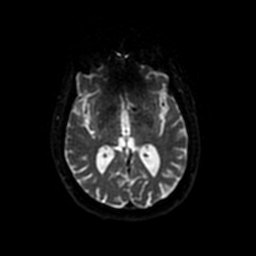
[im 67/112]
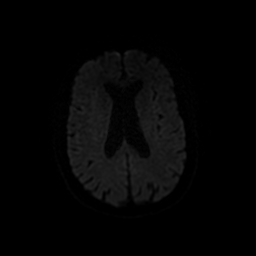
[im 78/112]
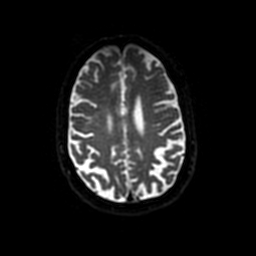
[im 89/112]
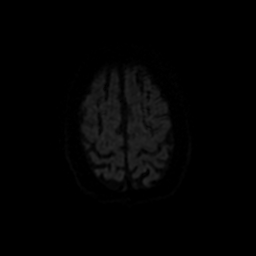
[im 100/112]
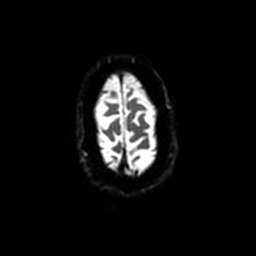
[im 112/112]
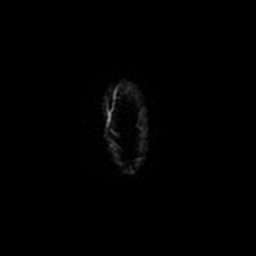

[Series 5: DWI · coronal · 5.0mm · 1.09mm/px · 7 of 72 slices shown (2 of 4)]
[im 1/72]
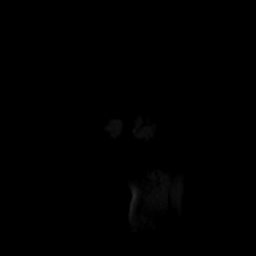
[im 12/72]
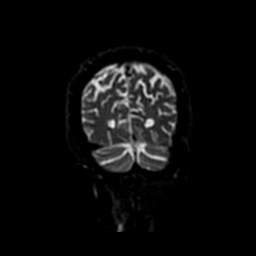
[im 24/72]
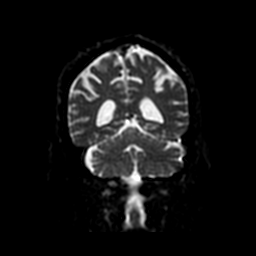
[im 36/72]
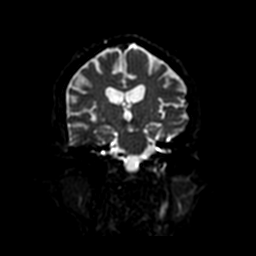
[im 48/72]
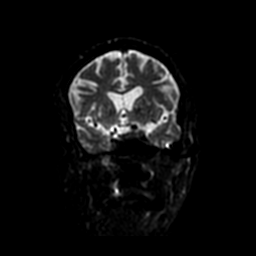
[im 60/72]
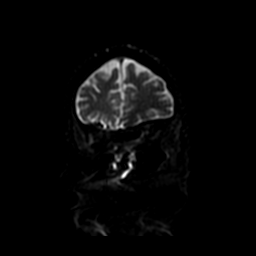
[im 72/72]
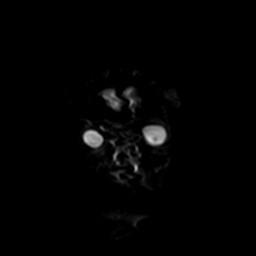

[Series 6: T2 · axial · 5.0mm · 0.45mm/px · z∈[-65,+117]mm · 3 of 29 slices shown (1 of 2)]
[im 1/29]
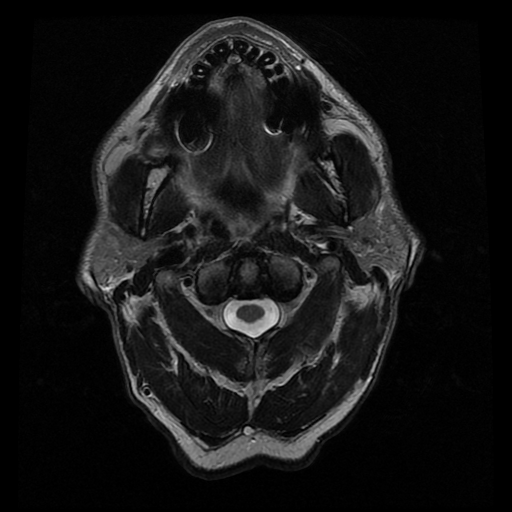
[im 15/29]
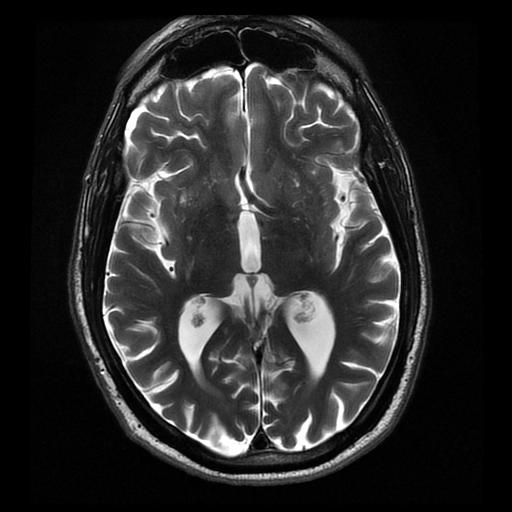
[im 29/29]
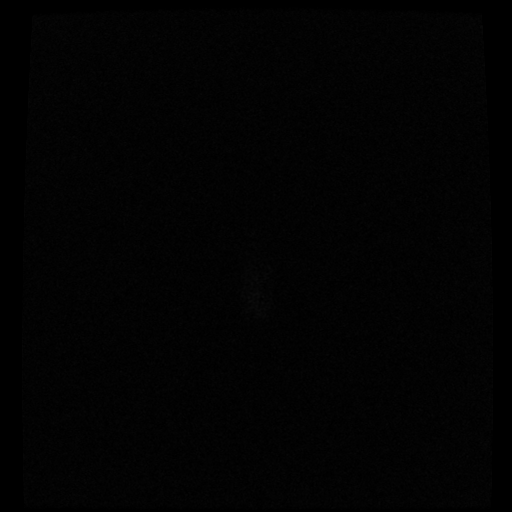

[Series 7: FLAIR · axial · 5.0mm · 0.45mm/px · z∈[-54,+115]mm · 3 of 27 slices shown]
[im 1/27]
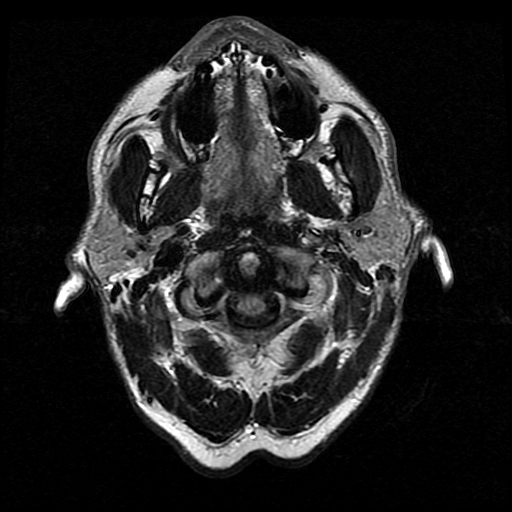
[im 14/27]
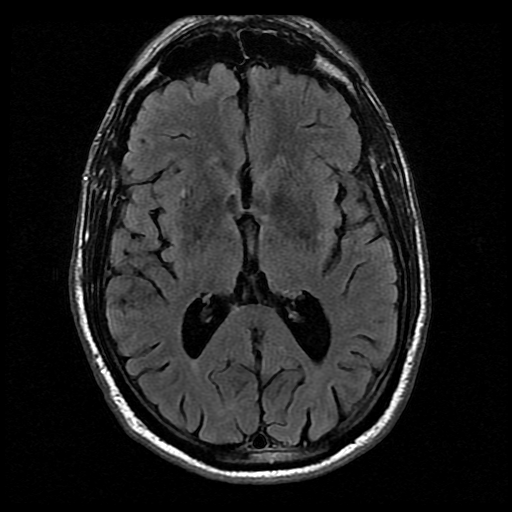
[im 27/27]
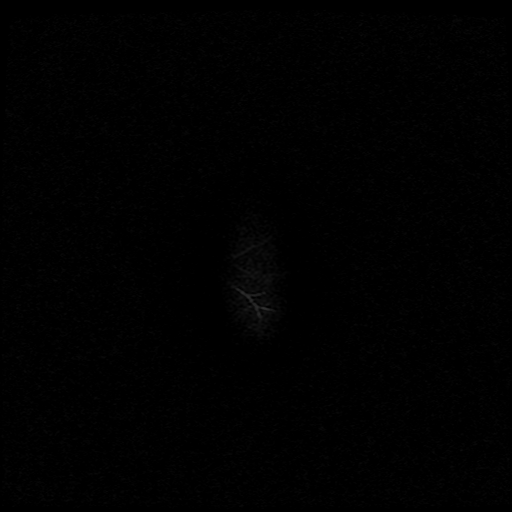

[Series 10: T2 · coronal · 5.0mm · 0.45mm/px · 3 of 29 slices shown (2 of 2)]
[im 1/29]
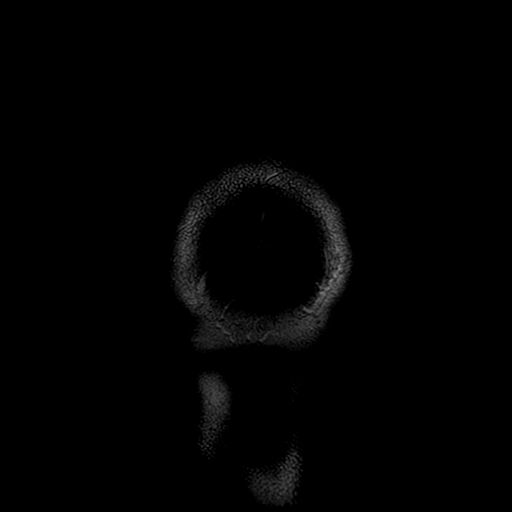
[im 15/29]
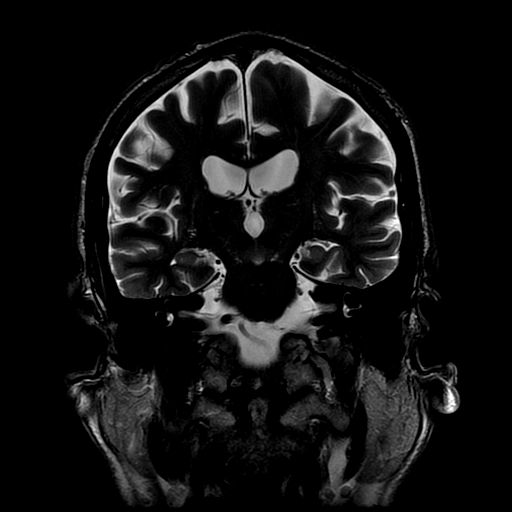
[im 29/29]
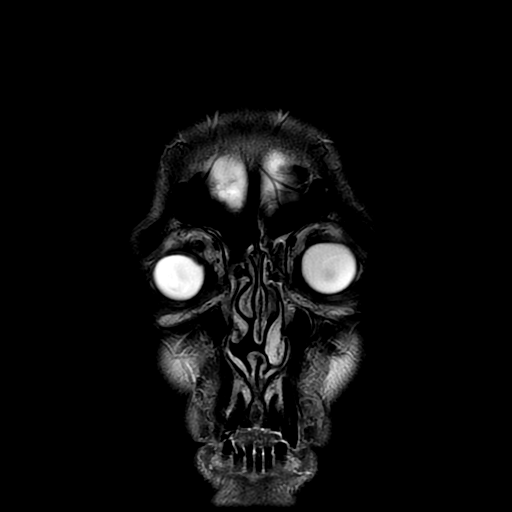

[Series 400: DWI · axial · 3.0mm · 1.09mm/px · z∈[-64,+101]mm · 6 of 56 slices shown (3 of 4)]
[im 1/56]
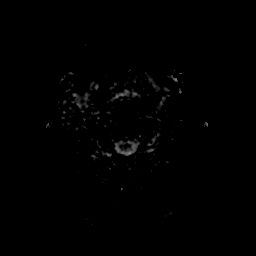
[im 12/56]
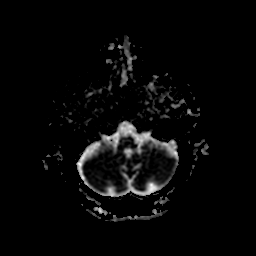
[im 23/56]
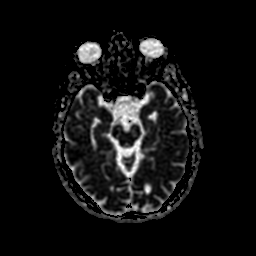
[im 34/56]
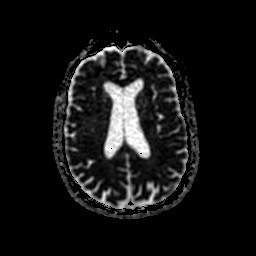
[im 45/56]
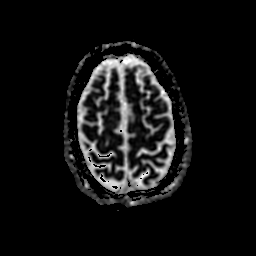
[im 56/56]
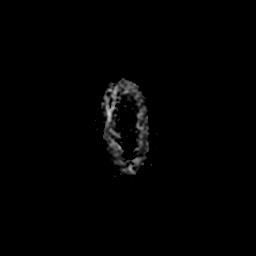

[Series 500: DWI · coronal · 5.0mm · 1.09mm/px · 4 of 36 slices shown (4 of 4)]
[im 1/36]
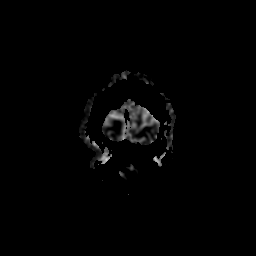
[im 12/36]
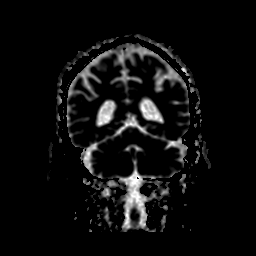
[im 24/36]
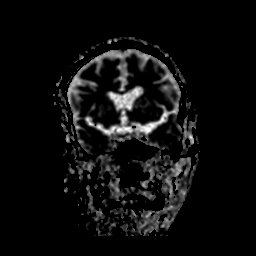
[im 36/36]
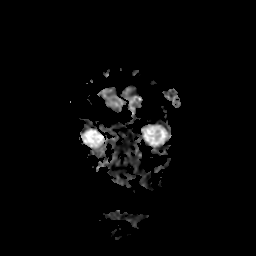

[38 of 48 positions shown; findings below may reference images not displayed]

FINDINGS: Cerebral volume is within normal limits for age. No restricted
diffusion to suggest acute infarction. No midline shift, mass
effect, evidence of mass lesion, ventriculomegaly, extra-axial
collection or acute intracranial hemorrhage. Cervicomedullary
junction and pituitary are within normal limits.

Major intracranial vascular flow voids are preserved. There is
generalized intracranial artery dolichoectasia. Azygos type ACA
anatomy (normal variant).

Gray and white matter signal is within normal limits for age
throughout the brain. No chronic cerebral blood products or
encephalomalacia identified.

Visible internal auditory structures appear normal. Mastoids are
clear. Scattered mild paranasal sinus mucosal thickening.
Postoperative changes to the right globe. Otherwise negative orbit
soft tissues. Negative scalp soft tissues negative visualized
cervical spine. Normal bone marrow signal.
IMPRESSION: Intracranial artery dolichoectasia, query chronic hypertension.
Otherwise normal for age noncontrast MRI appearance of the brain.

## 2017-08-15 ENCOUNTER — Other Ambulatory Visit: Payer: Self-pay | Admitting: Family Medicine

## 2017-08-15 DIAGNOSIS — E7800 Pure hypercholesterolemia, unspecified: Secondary | ICD-10-CM

## 2017-12-09 ENCOUNTER — Other Ambulatory Visit: Payer: Self-pay

## 2017-12-09 NOTE — Patient Outreach (Signed)
[facility]) Care Management  12/09/2017  Ryan [name] 23-Jul-1940 [id number]   Medication Adherence call to Ryan [name] spoke with patient he is still taking Lovastatin 20 mg and still has about a month he said he is taking it on a regular basis and does not need any at this time. [name] is showing past due under [place].   [facility] Direct Dial [phone]  Fax [phone] [email or web address]

## 2018-01-18 ENCOUNTER — Other Ambulatory Visit: Payer: Self-pay | Admitting: Family Medicine

## 2018-02-23 ENCOUNTER — Ambulatory Visit (INDEPENDENT_AMBULATORY_CARE_PROVIDER_SITE_OTHER): Payer: Medicare Other | Admitting: Family Medicine

## 2018-02-23 ENCOUNTER — Encounter: Payer: Self-pay | Admitting: Family Medicine

## 2018-02-23 ENCOUNTER — Other Ambulatory Visit: Payer: Self-pay

## 2018-02-23 DIAGNOSIS — F0390 Unspecified dementia without behavioral disturbance: Secondary | ICD-10-CM | POA: Insufficient documentation

## 2018-02-23 DIAGNOSIS — R413 Other amnesia: Secondary | ICD-10-CM | POA: Diagnosis not present

## 2018-02-23 MED ORDER — ZOSTER VAC RECOMB ADJUVANTED 50 MCG/0.5ML IM SUSR: 0.5000 mL | Freq: Once | INTRAMUSCULAR | 1 refills | Status: AC

## 2018-02-23 NOTE — Patient Instructions (Signed)
I will call with test results.  Remember, those tests will not give a firm answer. I will ask if you want to start a medication - aricept=doneprazol Time will tell.  Keep working together on this as a couple.   Someone will call about the CT scan of the head.

## 2018-02-24 ENCOUNTER — Encounter: Payer: Self-pay | Admitting: Family Medicine

## 2018-02-24 LAB — CMP14+EGFR
ALBUMIN: 4.4 g/dL (ref 3.5–4.8)
ALT: 18 IU/L (ref 0–44)
AST: 24 IU/L (ref 0–40)
Albumin/Globulin Ratio: 2 (ref 1.2–2.2)
Alkaline Phosphatase: 69 IU/L (ref 39–117)
BUN / CREAT RATIO: 16 (ref 10–24)
BUN: 21 mg/dL (ref 8–27)
Bilirubin Total: 0.8 mg/dL (ref 0.0–1.2)
CALCIUM: 9.4 mg/dL (ref 8.6–10.2)
CO2: 21 mmol/L (ref 20–29)
CREATININE: 1.31 mg/dL — AB (ref 0.76–1.27)
Chloride: 106 mmol/L (ref 96–106)
GFR calc Af Amer: 59 mL/min/{1.73_m2} — ABNORMAL LOW (ref >59)
GFR calc non Af Amer: 51 mL/min/{1.73_m2} — ABNORMAL LOW (ref >59)
GLUCOSE: 84 mg/dL (ref 65–99)
Globulin, Total: 2.2 g/dL (ref 1.5–4.5)
Potassium: 4.5 mmol/L (ref 3.5–5.2)
Sodium: 142 mmol/L (ref 134–144)
Total Protein: 6.6 g/dL (ref 6.0–8.5)

## 2018-02-24 LAB — TSH: TSH: 2.23 u[IU]/mL (ref 0.450–4.500)

## 2018-02-24 LAB — CBC
HEMATOCRIT: 48 % (ref 37.5–51.0)
Hemoglobin: 16.5 g/dL (ref 13.0–17.7)
MCH: 33.1 pg — AB (ref 26.6–33.0)
MCHC: 34.4 g/dL (ref 31.5–35.7)
MCV: 96 fL (ref 79–97)
Platelets: 211 10*3/uL (ref 150–450)
RBC: 4.99 x10E6/uL (ref 4.14–5.80)
RDW: 12.5 % (ref 11.6–15.4)
WBC: 6.5 10*3/uL (ref 3.4–10.8)

## 2018-02-24 LAB — RPR: RPR Ser Ql: NONREACTIVE

## 2018-02-24 NOTE — Assessment & Plan Note (Signed)
Strongly suspect early dementia.  Will check for reversible causes and likely start aricept.  Also talked about advance care planning. They already have regular and living wills.

## 2018-02-24 NOTE — Progress Notes (Signed)
Established Patient Office Visit  Subjective:  Patient ID: Ryan [name], male    DOB: [birth year]  Age: 78 y.o. MRN: [id number]  CC:  Chief Complaint  Patient presents with  . Annual Exam    HPI DONOVYN GUIDICE presents for not an annual exam. Memory concerns.  I first addressed memory concerns with him in May 2019.  Seemed minimal at the time, but he was alone.  Comes in with his wife, [name], following an email exchange, in which it is clear that he has progressive problems.  Also, has a family history of Altzheimer's dementia.  Not depressed.  He minimizes the symptoms.  Wife is more explicit.  Does best when focused on a single problem and still maintains many of his high functioning skills.  Does worst when distracted.  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   Past Medical History:  Diagnosis Date  . Arthritis   . BPH (benign prostatic hyperplasia)   . DVT (deep venous thrombosis) ([place])    x2 "Xarelto use"  . Hyperlipidemia     Past Surgical History:  Procedure Laterality Date  . APPENDECTOMY    . CATARACT EXTRACTION Right   . COLONOSCOPY W/ POLYPECTOMY     multiple  . COLONOSCOPY WITH PROPOFOL N/A 10/30/2014   Procedure: COLONOSCOPY WITH PROPOFOL;  Surgeon: Garlan Fair, MD;  Location: [facility];  Service: Endoscopy;  Laterality: N/A;  . EYE SURGERY     retinal repair bilateral  . HERNIA REPAIR     BIH repair child  . KNEE ARTHROSCOPY Left   . SEPTOPLASTY    . TONSILLECTOMY      Family History  Problem Relation Age of Onset  . Alzheimer's disease Mother   . Arthritis Mother   . Arthritis Father   . Cancer Sister        breast  . Cancer Brother        melanoma  . Stroke Paternal Grandfather     Social History   Socioeconomic History  . Marital status: Married    Spouse name: [name]  . Number of children: 6  . Years of education: 1  . Highest education  level: Not on file  Occupational History  . Occupation: retired-CFO  Social Needs  . Financial resource strain: Not on file  . Food insecurity:    Worry: Not on file    Inability: Not on file  . Transportation needs:    Medical: Not on file    Non-medical: Not on file  Tobacco Use  . Smoking status: Former Smoker    Last attempt to quit: 02/10/1980    Years since quitting: 38.0  . Smokeless tobacco: Never Used  Substance and Sexual Activity  . Alcohol use: Yes    Alcohol/week: 14.0 standard drinks    Types: 14 Standard drinks or equivalent per week    Comment:  daily  . Drug use: No  . Sexual activity: Not on file  Lifestyle  . Physical activity:    Days per week: Not on file    Minutes per session: Not on file  . Stress: Not on file  Relationships  . Social connections:    Talks on phone: Not on file    Gets together: Not on file    Attends religious service: Not on file    Active member of club or organization: Not on  file    Attends meetings of clubs or organizations: Not on file    Relationship status: Not on file  . Intimate partner violence:    Fear of current or ex partner: Not on file    Emotionally abused: Not on file    Physically abused: Not on file    Forced sexual activity: Not on file  Other Topics Concern  . Not on file  Social History Narrative   Health Care POA:    Emergency Contact: wife, [name] [phone]   End of Life Plan: Pt reports having a end of life plan and will bring copy for next visit   Who lives with you: wife, [name]   Any pets: none   Diet: Pt has a varied diet of protein, starch, vegetables   Exercise: Pt reports playing tennis and completing yard work at least 5 days per week   Seatbelts: Pt reports wearing seatbelt when in vehicles.    Sun Exposure/Protection: Pt reports wearing ball cap but no sun block   Hobbies: tennis, biking, volunteering, yard work     Outpatient Medications Prior to Visit  Medication Sig Dispense Refill   . acetaminophen (TYLENOL) 325 MG tablet Take 650 mg by mouth every 6 (six) hours as needed for mild pain.    Marland Kitchen lovastatin (MEVACOR) 20 MG tablet TAKE ONE TABLET AT BEDTIME. 90 tablet 3  . Multiple Vitamin (MULTIVITAMIN WITH MINERALS) TABS tablet Take 1 tablet by mouth daily.    . traZODone (DESYREL) 50 MG tablet TAKE 1 TABLET AT BEDTIME AS NEEDED FOR SLEEP. MAY REPEAT ONCE IF STILL AWAKE. 100 tablet 3  . XARELTO 20 MG TABS tablet TAKE 1 TABLET ONCE DAILY WITH SUPPER. 90 tablet 3  . clonazePAM (KLONOPIN) 1 MG tablet 1 mg nightly.    . fluticasone (FLONASE) 50 MCG/ACT nasal spray Place 2 sprays into both nostrils daily. 16 g 6  . Glucosamine-Chondroit-Vit C-Mn (GLUCOSAMINE-CHONDROITIN) TABS Take 1 tablet by mouth daily.    . montelukast (SINGULAIR) 10 MG tablet Take 1 tablet (10 mg total) by mouth at bedtime. 30 tablet 3  . tamsulosin (FLOMAX) 0.4 MG CAPS capsule TAKE (1) CAPSULE DAILY. 90 capsule 3   No facility-administered medications prior to visit.     Allergies  Allergen Reactions  . Simvastatin Itching    REACTION: Low level CK elevation  . Warfarin Sodium Rash    ROS Review of Systems    Objective:    Physical Exam  BP 124/66   Pulse (!) 44   Temp 97.9 F (36.6 C) (Oral)   Ht '6\' 4"'$  (1.93 m)   Wt 199 lb (90.3 kg)   SpO2 95%   BMI 24.22 kg/m  Wt Readings from Last 3 Encounters:  02/23/18 199 lb (90.3 kg)  07/07/17 190 lb 9.6 oz (86.5 kg)  06/09/17 194 lb 3.2 oz (88.1 kg)   HEENT normal Neck no masses, Lungs clear Cardiac RRR without m or g Ext no edema. NEuro, motor, sensory and gait grossly normal.    >50% of visit was counseling.  Duration of visit was 25 minutes.  There are no preventive care reminders to display for this patient.  There are no preventive care reminders to display for this patient.  Lab Results  Component Value Date   TSH 2.230 02/23/2018   Lab Results  Component Value Date   WBC 6.5 02/23/2018   HGB 16.5 02/23/2018   HCT 48.0  02/23/2018   MCV 96 02/23/2018   PLT  211 02/23/2018   Lab Results  Component Value Date   NA 142 02/23/2018   K 4.5 02/23/2018   CO2 21 02/23/2018   GLUCOSE 84 02/23/2018   BUN 21 02/23/2018   CREATININE 1.31 (H) 02/23/2018   BILITOT 0.8 02/23/2018   ALKPHOS 69 02/23/2018   AST 24 02/23/2018   ALT 18 02/23/2018   PROT 6.6 02/23/2018   ALBUMIN 4.4 02/23/2018   CALCIUM 9.4 02/23/2018   ANIONGAP 8 06/23/2016   Lab Results  Component Value Date   CHOL 169 07/02/2017   Lab Results  Component Value Date   HDL 59 07/02/2017   Lab Results  Component Value Date   LDLCALC 83 07/02/2017   Lab Results  Component Value Date   TRIG 133 07/02/2017   Lab Results  Component Value Date   CHOLHDL 2.9 07/02/2017   Lab Results  Component Value Date   HGBA1C 5.1 07/07/2017      Assessment & Plan:   Problem List Items Addressed This Visit    Memory difficulty   Relevant Orders   CBC (Completed)   CMP14+EGFR (Completed)   TSH (Completed)   RPR (Completed)   CT Head Wo Contrast      Meds ordered this encounter  Medications  . Zoster Vaccine Adjuvanted [facility]) injection    Sig: Inject 0.5 mLs into the muscle once for 1 dose.    Dispense:  0.5 mL    Refill:  1    Second dose 2-6 months after the first    Follow-up: No follow-ups on file.    Zenia Resides, MD

## 2018-02-28 ENCOUNTER — Ambulatory Visit
Admission: RE | Admit: 2018-02-28 | Discharge: 2018-02-28 | Disposition: A | Discharge status: Home / Self Care | Payer: Medicare Other | Source: Ambulatory Visit | Attending: Family Medicine | Admitting: Family Medicine

## 2018-02-28 DIAGNOSIS — R51 Headache: Secondary | ICD-10-CM | POA: Diagnosis not present

## 2018-02-28 DIAGNOSIS — R413 Other amnesia: Secondary | ICD-10-CM

## 2018-03-02 ENCOUNTER — Other Ambulatory Visit: Payer: Self-pay | Admitting: Family Medicine

## 2018-03-02 DIAGNOSIS — R413 Other amnesia: Secondary | ICD-10-CM

## 2018-03-02 MED ORDER — DONEPEZIL HCL 5 MG PO TABS: 5.0000 mg | ORAL_TABLET | Freq: Every day | ORAL | 0 refills | Status: DC

## 2018-03-02 NOTE — Progress Notes (Signed)
Copied from email correspondence  I will send in Rx for one month of 5 mg.  Provided you are not having side effects, after one month, I will increase the dose to 10 mg.  Call me before you run out of this first prescription and let me know how you are doing.    Jet Traynham A. Andria Frames, MD Direct Office # [phone] Page # [phone] Mobile # [phone]  From: [name]>  Sent: Wednesday, March 02, 2018 2:53 PM To: Athalie Newhard, Bill [email or web address]> Subject: [External Email]Re: CT results  *Caution - External email - see footer for warnings* [employer].  I'm inclined to do the meds - I have a daily routine for meds already so adding a medication should be easy.  How to proceed? [facility] is my pharmacy.   [name]  On Wed, Mar 02, 2018 at 9:19 AM Hyland Mollenkopf, Bill [email or web address]> wrote: [name] (and [name]), I have tried to call you about your CT scan results a few times.  Here is the official report, which I will follow with an explanation:   IMPRESSION: 1. Moderate global atrophy most notable parietal lobes. 2. Chronic microvascular changes. 3. No intracranial hemorrhage or CT evidence of large acute infarct. 4. Prominent atherosclerotic changes intracranial vasculature.   Recall that I did the CT looking for tumors or other brain problems that might cause memory problems.  You do not have any of those other problems.     The "atrophy" and "microvascular changes" are nonspecific and still worrisome findings.  We see those changes in both normal aging and dementia.  The more atrophy and microvascular changes we see, the more likely the diagnosis of dementia.  Still, someone can have moderate or severe atrophy and not have dementia.     Bottom line: The CT scan results don't prove anything.  It is one more bit of evidence that makes the possibility of dementia more likely - but NOT proven.     Call me and let me know if you want to start on the  medication that may help.  I think it is reasonable to prescribe and it is also reasonable to wait - so your choice.  Also call if questions.   [place]. Andria Frames, MD Direct Office # [phone] Page # [phone] Mobile # [phone]

## 2018-03-25 DIAGNOSIS — Z961 Presence of intraocular lens: Secondary | ICD-10-CM | POA: Diagnosis not present

## 2018-03-25 DIAGNOSIS — Z8669 Personal history of other diseases of the nervous system and sense organs: Secondary | ICD-10-CM | POA: Diagnosis not present

## 2018-03-25 DIAGNOSIS — H02403 Unspecified ptosis of bilateral eyelids: Secondary | ICD-10-CM | POA: Diagnosis not present

## 2018-03-25 DIAGNOSIS — H26492 Other secondary cataract, left eye: Secondary | ICD-10-CM | POA: Diagnosis not present

## 2018-03-29 ENCOUNTER — Other Ambulatory Visit: Payer: Self-pay | Admitting: Family Medicine

## 2018-03-29 MED ORDER — DONEPEZIL HCL 10 MG PO TABS: 10.0000 mg | ORAL_TABLET | Freq: Every day | ORAL | 3 refills | Status: DC

## 2018-03-29 NOTE — Progress Notes (Signed)
See email exchange copied below  [place], no adverse affects.  If you can call in the prescription to [facility], we will proceed as outlined.  [name] copied here to solicit any feedback she may have. Ryan [name]  On Tue, Mar 29, 2018 at 9:06 AM Sayeed Weatherall, Bill [email or web address]> wrote: Ryan [name],  Your check in note was specifically about the Donepezil.  I began it one month ago to see if it helped with your memory - something you should discuss with [name].   Are you having any side effects? Typically, if you are not having side effects after one month, I increase the dose from 5 mg to 10 mg daily.  Even if you have not yet seen any benefit, I would try the higher dose provided you are not having side effects.  After you have been on the higher dose of 10 mg for a month, you should check back with me to let me know if the medicine is helping.  [name] needs to let you know if she sees any benefit from the medication. Nicolette Bang A. Andria Frames, MD Direct Office # [phone] Page # [phone] Mobile # [phone]   From: [name] [email or web address]>  Sent: Tuesday, March 29, 2018 8:47 AM To: Sandee Bernath, Bill [email or web address]> Subject: [External Email]Medications   *Caution - External email - see footer for warnings* Bill, I have a note to check with you [id number]. Not sure why......current meds are Tamsulosin HCL, Trazodone,         Donepezil  HCL (no refills), Lovastatin, and Xarelto. Please advise, esp re: Donepezil HCL Many thanks. Ryan [name]  --

## 2018-05-11 ENCOUNTER — Other Ambulatory Visit: Payer: Self-pay | Admitting: *Deleted

## 2018-05-11 DIAGNOSIS — I712 Thoracic aortic aneurysm, without rupture, unspecified: Secondary | ICD-10-CM

## 2018-05-11 NOTE — Progress Notes (Unsigned)
Ct a 

## 2018-06-01 ENCOUNTER — Other Ambulatory Visit: Payer: Self-pay | Admitting: Family Medicine

## 2018-06-01 MED ORDER — DONEPEZIL HCL 10 MG PO TABS: 10.0000 mg | ORAL_TABLET | Freq: Every day | ORAL | 3 refills | Status: DC

## 2018-06-01 MED ORDER — TRAZODONE HCL 50 MG PO TABS: ORAL_TABLET | ORAL | 3 refills | Status: DC

## 2018-07-05 ENCOUNTER — Other Ambulatory Visit: Payer: Self-pay

## 2018-07-05 ENCOUNTER — Ambulatory Visit
Admission: RE | Admit: 2018-07-05 | Discharge: 2018-07-05 | Disposition: A | Discharge status: Home / Self Care | Payer: Medicare Other | Source: Ambulatory Visit | Attending: Cardiothoracic Surgery | Admitting: Cardiothoracic Surgery

## 2018-07-05 DIAGNOSIS — I712 Thoracic aortic aneurysm, without rupture, unspecified: Secondary | ICD-10-CM

## 2018-07-05 MED ORDER — IOPAMIDOL (ISOVUE-370) INJECTION 76%
75.0000 mL | Freq: Once | INTRAVENOUS | Status: AC | PRN
  Administered 2018-07-05: 75 mL via INTRAVENOUS

## 2018-07-06 ENCOUNTER — Ambulatory Visit: Payer: Medicare Other | Admitting: Cardiothoracic Surgery

## 2018-07-06 ENCOUNTER — Encounter: Payer: Self-pay | Admitting: Cardiothoracic Surgery

## 2018-07-06 VITALS — BP 121/66 | HR 84 | Temp 97.5°F | Resp 20 | Ht 76.0 in | Wt 197.0 lb

## 2018-07-06 DIAGNOSIS — I712 Thoracic aortic aneurysm, without rupture, unspecified: Secondary | ICD-10-CM

## 2018-07-06 NOTE — Progress Notes (Signed)
PCP is Hensel, Jamal Collin, MD Referring Provider is Zenia Resides, MD  Chief Complaint  Patient presents with  . Thoracic Aortic Aneurysm    1 year f/u with CTA Chest    HPI: Patient returns for scheduled one-year follow-up visit with CT scan to assess moderate fusiform ascending aneurysm.  This was first noted in 2018 at 4.6 cm.  It is asymptomatic.  The patient does not have blood pressure and is a non-smoker.  In fact is an avid tennis player-3 times a week and rides his bike as well.  CTA images performed today personally reviewed and discussed with patient.  Ascending thoracic aorta diameter remains at 4.6 cm.  There is no mural thickening or ulceration.  Minimal calcification.  Lung windows are clear.  No pleural effusion.  Past Medical History:  Diagnosis Date  . Arthritis   . BPH (benign prostatic hyperplasia)   . DVT (deep venous thrombosis) ([place])    x2 "Xarelto use"  . Hyperlipidemia     Past Surgical History:  Procedure Laterality Date  . APPENDECTOMY    . CATARACT EXTRACTION Right   . COLONOSCOPY W/ POLYPECTOMY     multiple  . COLONOSCOPY WITH PROPOFOL N/A 10/30/2014   Procedure: COLONOSCOPY WITH PROPOFOL;  Surgeon: Garlan Fair, MD;  Location: [facility];  Service: Endoscopy;  Laterality: N/A;  . EYE SURGERY     retinal repair bilateral  . HERNIA REPAIR     BIH repair child  . KNEE ARTHROSCOPY Left   . SEPTOPLASTY    . TONSILLECTOMY      Family History  Problem Relation Age of Onset  . Alzheimer's disease Mother   . Arthritis Mother   . Arthritis Father   . Cancer Sister        breast  . Cancer Brother        melanoma  . Stroke Paternal Grandfather     Social History Social History   Tobacco Use  . Smoking status: Former Smoker    Last attempt to quit: 02/10/1980    Years since quitting: 38.4  . Smokeless tobacco: Never Used  Substance Use Topics  . Alcohol use: Yes    Alcohol/week: 14.0 standard drinks    Types: 14 Standard drinks or  equivalent per week    Comment:  daily  . Drug use: No    Current Outpatient Medications  Medication Sig Dispense Refill  . acetaminophen (TYLENOL) 325 MG tablet Take 650 mg by mouth every 6 (six) hours as needed for mild pain.    Marland Kitchen donepezil (ARICEPT) 10 MG tablet Take 1 tablet (10 mg total) by mouth at bedtime. 90 tablet 3  . lovastatin (MEVACOR) 20 MG tablet TAKE ONE TABLET AT BEDTIME. 90 tablet 3  . Multiple Vitamin (MULTIVITAMIN WITH MINERALS) TABS tablet Take 1 tablet by mouth daily.    . traZODone (DESYREL) 50 MG tablet TAKE 1 TABLET AT BEDTIME AS NEEDED FOR SLEEP. MAY REPEAT ONCE IF STILL AWAKE. 100 tablet 3  . XARELTO 20 MG TABS tablet TAKE 1 TABLET ONCE DAILY WITH SUPPER. 90 tablet 3   No current facility-administered medications for this visit.     Allergies  Allergen Reactions  . Simvastatin Itching    REACTION: Low level CK elevation  . Warfarin Sodium Rash    Review of Systems  Weight stable No fever cough shortness of breath or symptoms of COVID viral syndrome No orthopnea PND Mild stable ankle edema related to venous insufficiency No chest  pain or abdominal pain  BP 121/66   Pulse 84   Temp (!) 97.5 F (36.4 C) (Skin)   Resp 20   Ht 6\' 4"  (1.93 m)   Wt 197 lb (89.4 kg)   SpO2 95% Comment: RA  BMI 23.98 kg/m  Physical Exam      Exam    General- alert and comfortable    Neck- no JVD, no cervical adenopathy palpable, no carotid bruit   Lungs- clear without rales, wheezes   Cor- regular rate and rhythm, [id number] systolic murmur , no  gallop   Abdomen- soft, non-tender   Extremities - warm, non-tender, minimal edema   Neuro- oriented, appropriate, no focal weakness   Diagnostic Tests: CTA shows stable moderate 4.6 cm fusiform aneurysm   Impression: Risk of dissection at current diameter is less than 2%.  Best treatment is continued blood pressure monitoring and surveillance scans.  We will plan on seeing the patient back with a one-year  surveillance scan next summer.  Plan: Return in 1 year with CTA of thoracic aorta.  Continue with heart healthy lifestyle and diet which were discussed in detail with patient.   Len Childs, MD [facility] [phone]

## 2018-07-08 ENCOUNTER — Other Ambulatory Visit: Payer: Self-pay | Admitting: Family Medicine

## 2018-08-20 ENCOUNTER — Other Ambulatory Visit: Payer: Self-pay | Admitting: Family Medicine

## 2018-08-20 DIAGNOSIS — E7800 Pure hypercholesterolemia, unspecified: Secondary | ICD-10-CM

## 2018-10-23 ENCOUNTER — Other Ambulatory Visit: Payer: Self-pay | Admitting: Family Medicine

## 2018-11-18 ENCOUNTER — Encounter: Payer: Self-pay | Admitting: Family Medicine

## 2018-11-18 NOTE — Progress Notes (Signed)
Email exchange with wife, Ryan [name].  Ryan [name], The worry is real - and it is the same worry you have had all along.  Ryan [name] has dementia.  The truth is harsh: Dementia is a progressive neuro-degenerative disorder for which there is no cure.  His downward decline will continue.  People vary on how slow or fast the decline is - but everyone declines.  In patients who have a good response like Ryan [name], the medicine will bump up the functional level but not stop the decline.  Ryan [name] is on the top dose of donepezil.  The is a second medicine I can add in the hope of bumping up his functional status again.  You discovered that he does best in a familiar environment and is more easily confused in an unfamiliar environment like traveling.  Routines are helpful.  Keep a close eye on his driving.  The concern is about him getting lost or getting into an accident.  The idea of UTIs causing confusion is still prevalent even though scientific evidence dose not support the association.  Come in and see me if you think it is time to try adding on the second medication. BH  From: [name]>  Sent: Thursday, November 17, 2018 4:20 PM To: Ryan [name], Ryan [name] @[email or web address]> Subject: Confidential -Ryan [name]  *Caution - External email - see footer for warnings* The good news is that Ryan [name] is still paying tennis a minimum of 3*week. I have not seen him on the tennis court, but he is still going, so it must be at least OK. He is also doing yard things-gardrns, etc. 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  Also,  his day to day memory issues are manageable. I have developed coping mechanisms around them. His driving is still excellent.  I am writing you because of the following that have occurred in the last week or so: 1) Ryan [name]'s walking pace has slowed. He does not keep up with me anymore and I stop to let him catch up. He also  sways/steps a bit side to side instead of just straight ahead. 2)He received a call and wanted to call that person. Got confused between phone and email. Never could figure it out.Finally gave it to me to make the call. 3) Going to and coming home from [place], every 15-20 minutes, he questioned whether the gps had [place] on the right route. I kept reassuring him. 4) while we were there, he could not remember how he turned on the shower and could not figure put how to turn it off. Yelled for me (scared me) and I ran to him and turned it off.  What do you think? Is my concern valid? Any action necessary?Might he have a UTI with no obvious symptoms? His only fluid intake continues to be coffee, beer, and wine. Almost no water.   Thank you for your guidance.  Ryan [name] Partner Consultant [place]  [phone] Sent from my Iphone

## 2019-01-23 ENCOUNTER — Other Ambulatory Visit: Payer: Self-pay

## 2019-01-23 NOTE — Patient Outreach (Signed)
[facility]) Care Management  01/23/2019  COULTON KOH March 11, 1940 [id number]   Medication Adherence call to Mr. [name] HIPPA Compliant Voice message left with a call back number. Mr. Weidert is showing past due on Lovastatin 20 mg under [place].   [place] Management Direct Dial [phone]  Fax [phone] [email or web address]

## 2019-01-24 ENCOUNTER — Other Ambulatory Visit: Payer: Self-pay

## 2019-01-24 NOTE — Patient Outreach (Signed)
[facility]) Care Management  01/24/2019  ULAS METZKER August 03, 1940 [id number]   Medication Adherence call to Mr. [name] HIPPA Compliant Voice message left with a call back number. Mr. Cannady is showing past due on Lovastatin 20 mg under [place].  [place] Management Direct Dial [phone]  Fax [phone] [email or web address]

## 2019-01-31 ENCOUNTER — Other Ambulatory Visit: Payer: Self-pay | Admitting: Family Medicine

## 2019-01-31 DIAGNOSIS — E7800 Pure hypercholesterolemia, unspecified: Secondary | ICD-10-CM

## 2019-02-01 NOTE — Telephone Encounter (Signed)
2nd request. Jazmin Hartsell,CMA  

## 2019-02-08 ENCOUNTER — Other Ambulatory Visit: Payer: Self-pay | Admitting: Family Medicine

## 2019-02-08 DIAGNOSIS — E7800 Pure hypercholesterolemia, unspecified: Secondary | ICD-10-CM

## 2019-02-08 MED ORDER — LOVASTATIN 20 MG PO TABS: 20.0000 mg | ORAL_TABLET | Freq: Every day | ORAL | 3 refills | Status: DC

## 2019-02-21 ENCOUNTER — Other Ambulatory Visit: Payer: Self-pay | Admitting: Family Medicine

## 2019-03-20 DIAGNOSIS — Z961 Presence of intraocular lens: Secondary | ICD-10-CM | POA: Diagnosis not present

## 2019-03-20 DIAGNOSIS — Z8669 Personal history of other diseases of the nervous system and sense organs: Secondary | ICD-10-CM | POA: Diagnosis not present

## 2019-03-20 DIAGNOSIS — H26492 Other secondary cataract, left eye: Secondary | ICD-10-CM | POA: Diagnosis not present

## 2019-05-10 ENCOUNTER — Encounter: Payer: Self-pay | Admitting: Family Medicine

## 2019-05-10 ENCOUNTER — Other Ambulatory Visit: Payer: Self-pay

## 2019-05-10 ENCOUNTER — Ambulatory Visit (INDEPENDENT_AMBULATORY_CARE_PROVIDER_SITE_OTHER): Payer: Medicare Other | Admitting: Family Medicine

## 2019-05-10 DIAGNOSIS — Z7901 Long term (current) use of anticoagulants: Secondary | ICD-10-CM

## 2019-05-10 DIAGNOSIS — I712 Thoracic aortic aneurysm, without rupture, unspecified: Secondary | ICD-10-CM

## 2019-05-10 DIAGNOSIS — F0390 Unspecified dementia without behavioral disturbance: Secondary | ICD-10-CM | POA: Diagnosis not present

## 2019-05-10 DIAGNOSIS — E7800 Pure hypercholesterolemia, unspecified: Secondary | ICD-10-CM

## 2019-05-10 DIAGNOSIS — Z0000 Encounter for general adult medical examination without abnormal findings: Secondary | ICD-10-CM

## 2019-05-10 NOTE — Patient Instructions (Signed)
I am very pleased that you are only having a slow decline No changes I will send a letter with the blood test results. I will work with you about when or if to do another echocardiogram for the mild aortic stenosis.  Symptoms that would make me worry would be chest pain, passing out, easy fatigability.   Things go in phases.  You don't need any additional medicine now.  Stay in touch - especially if any big changes and we can revisit the issue of adding medicines.

## 2019-05-11 ENCOUNTER — Encounter: Payer: Self-pay | Admitting: Family Medicine

## 2019-05-11 LAB — CMP14+EGFR
ALT: 14 IU/L (ref 0–44)
AST: 21 IU/L (ref 0–40)
Albumin/Globulin Ratio: 2 (ref 1.2–2.2)
Albumin: 4.6 g/dL (ref 3.7–4.7)
Alkaline Phosphatase: 69 IU/L (ref 39–117)
BUN/Creatinine Ratio: 16 (ref 10–24)
BUN: 21 mg/dL (ref 8–27)
Bilirubin Total: 0.7 mg/dL (ref 0.0–1.2)
CO2: 26 mmol/L (ref 20–29)
Calcium: 9.5 mg/dL (ref 8.6–10.2)
Chloride: 104 mmol/L (ref 96–106)
Creatinine, Ser: 1.28 mg/dL — ABNORMAL HIGH (ref 0.76–1.27)
GFR calc Af Amer: 61 mL/min/{1.73_m2} (ref >59)
GFR calc non Af Amer: 53 mL/min/{1.73_m2} — ABNORMAL LOW (ref >59)
Globulin, Total: 2.3 g/dL (ref 1.5–4.5)
Glucose: 89 mg/dL (ref 65–99)
Potassium: 4.7 mmol/L (ref 3.5–5.2)
Sodium: 144 mmol/L (ref 134–144)
Total Protein: 6.9 g/dL (ref 6.0–8.5)

## 2019-05-11 LAB — CBC
Hematocrit: 47.9 % (ref 37.5–51.0)
Hemoglobin: 16.4 g/dL (ref 13.0–17.7)
MCH: 33.1 pg — ABNORMAL HIGH (ref 26.6–33.0)
MCHC: 34.2 g/dL (ref 31.5–35.7)
MCV: 97 fL (ref 79–97)
Platelets: 194 10*3/uL (ref 150–450)
RBC: 4.95 x10E6/uL (ref 4.14–5.80)
RDW: 12.1 % (ref 11.6–15.4)
WBC: 6.5 10*3/uL (ref 3.4–10.8)

## 2019-05-11 LAB — LIPID PANEL
Chol/HDL Ratio: 2.5 ratio (ref 0.0–5.0)
Cholesterol, Total: 176 mg/dL (ref 100–199)
HDL: 70 mg/dL (ref >39)
LDL Chol Calc (NIH): 78 mg/dL (ref 0–99)
Triglycerides: 167 mg/dL — ABNORMAL HIGH (ref 0–149)
VLDL Cholesterol Cal: 28 mg/dL (ref 5–40)

## 2019-05-11 NOTE — Assessment & Plan Note (Signed)
Thankfully this seems slowly progressive.

## 2019-05-11 NOTE — Progress Notes (Signed)
    SUBJECTIVE:   CHIEF COMPLAINT / HPI:   Annual exam and follow up of dementia.   Ryan [name] comes in with wife and they do a great job collaborating for the history.  Physically, he is doing well and maintains an active lifestyle.  Plays tennis three days per week.  Eats healthy.  Maintains his weight nicely.  He hasn't slowed down much at all.  Mentally, he does OK.  Wife clearly describes difficulty in converting short term to long term memory.  Still doing most things he did previously.  He drives safely and has not gotten lost.  He can no longer due the taxes (significant in that he was the [facility] for [facility].)  Family all notice the decline.  We all rejoice that it is slow (His wife's sister is a point in contrast.  She has dementia with a rapid decline.)   He is up to date on screening and testing.  He has a healthy lifestyle as above.  Hx of multiple DVT.  Remains on Xarelto.  No bleeding noted.  PERTINENT  PMH / PSH: Denies CP, SOB, rash, worrisome skin lesions, headache, bleeding or leg swelling.  No change in bowel, bladder appetite.  OBJECTIVE:   BP 122/72   Pulse (!) 53   Ht 6\' 4"  (1.93 m)   Wt 197 lb 9.6 oz (89.6 kg)   SpO2 99%   BMI 24.05 kg/m   Lungs clear Cardiac RRR without m or g Abd benign Ext no edema. Neuro - carries on a cogent converstation.  Often defers to wife.  No focal numbness or weakness.  ASSESSMENT/PLAN:   Routine general medical examination at a health care facility Healthy male with slowly progressive dementia.  No at risk habits or behaviors.  Pure hypercholesterolemia Check labs  Unspecified dementia without behavioral disturbance ([place]) Thankfully this seems slowly progressive.  Thoracic aortic aneurysm without rupture ([place]) Followed by [facility].  Patient has no progression.  Most recent scan 07/2018     Zenia Resides, MD [place]

## 2019-05-11 NOTE — Assessment & Plan Note (Signed)
Followed by [facility].  Patient has no progression.  Most recent scan 07/2018

## 2019-05-11 NOTE — Assessment & Plan Note (Signed)
Healthy male with slowly progressive dementia.  No at risk habits or behaviors.

## 2019-05-11 NOTE — Assessment & Plan Note (Signed)
Check labs 

## 2019-05-17 ENCOUNTER — Other Ambulatory Visit: Payer: Self-pay | Admitting: Family Medicine

## 2019-05-17 DIAGNOSIS — F0390 Unspecified dementia without behavioral disturbance: Secondary | ICD-10-CM

## 2019-05-17 MED ORDER — MEMANTINE HCL 5 MG PO TABS: 5.0000 mg | ORAL_TABLET | Freq: Two times a day (BID) | ORAL | 1 refills | Status: DC

## 2019-07-27 ENCOUNTER — Other Ambulatory Visit: Payer: Self-pay | Admitting: Cardiothoracic Surgery

## 2019-07-27 DIAGNOSIS — R911 Solitary pulmonary nodule: Secondary | ICD-10-CM

## 2019-07-27 DIAGNOSIS — I712 Thoracic aortic aneurysm, without rupture, unspecified: Secondary | ICD-10-CM

## 2019-08-16 ENCOUNTER — Ambulatory Visit: Payer: Medicare Other | Admitting: Cardiothoracic Surgery

## 2019-08-23 ENCOUNTER — Encounter: Payer: Self-pay | Admitting: Cardiothoracic Surgery

## 2019-08-23 ENCOUNTER — Ambulatory Visit
Admission: RE | Admit: 2019-08-23 | Discharge: 2019-08-23 | Disposition: A | Discharge status: Home / Self Care | Payer: Medicare Other | Source: Ambulatory Visit | Attending: Cardiothoracic Surgery | Admitting: Cardiothoracic Surgery

## 2019-08-23 ENCOUNTER — Ambulatory Visit: Payer: Medicare Other | Admitting: Cardiothoracic Surgery

## 2019-08-23 ENCOUNTER — Other Ambulatory Visit: Payer: Self-pay

## 2019-08-23 VITALS — BP 136/73 | HR 45 | Temp 97.7°F | Resp 20 | Ht 76.0 in | Wt 191.8 lb

## 2019-08-23 DIAGNOSIS — I712 Thoracic aortic aneurysm, without rupture, unspecified: Secondary | ICD-10-CM

## 2019-08-23 DIAGNOSIS — I700 Atherosclerosis of aorta: Secondary | ICD-10-CM | POA: Diagnosis not present

## 2019-08-23 DIAGNOSIS — I358 Other nonrheumatic aortic valve disorders: Secondary | ICD-10-CM | POA: Diagnosis not present

## 2019-08-23 DIAGNOSIS — I2510 Atherosclerotic heart disease of native coronary artery without angina pectoris: Secondary | ICD-10-CM | POA: Diagnosis not present

## 2019-08-23 MED ORDER — IOPAMIDOL (ISOVUE-370) INJECTION 76%
75.0000 mL | Freq: Once | INTRAVENOUS | Status: AC | PRN
  Administered 2019-08-23: 75 mL via INTRAVENOUS

## 2019-08-23 NOTE — Progress Notes (Signed)
PCP is Hensel, Jamal Collin, MD Referring Provider is Zenia Resides, MD  Chief Complaint  Patient presents with  . Thoracic Aortic Aneurysm    1 year f/u with CTA Chest    HPI: Patient presents for annual office visit with CTA to evaluate an ascending thoracic fusiform aneurysm.  He was first noted in 2017 and measured 4.5 cm in diameter.  The aorta has remained the same diameter since then and he remains asymptomatic.  An echo of his aortic valve in 2018 was unremarkable. Patient has normal blood pressure.  Mains very active and plays tennis.  Reviewed the CTA scan personally which shows a stable 4.5 cm fusiform ascending aorta with smooth border, no ulceration or mural thrombus.  Past Medical History:  Diagnosis Date  . Arthritis   . BPH (benign prostatic hyperplasia)   . DVT (deep venous thrombosis) ([place])    x2 "Xarelto use"  . Hyperlipidemia     Past Surgical History:  Procedure Laterality Date  . APPENDECTOMY    . CATARACT EXTRACTION Right   . COLONOSCOPY W/ POLYPECTOMY     multiple  . COLONOSCOPY WITH PROPOFOL N/A 10/30/2014   Procedure: COLONOSCOPY WITH PROPOFOL;  Surgeon: Garlan Fair, MD;  Location: [facility];  Service: Endoscopy;  Laterality: N/A;  . EYE SURGERY     retinal repair bilateral  . HERNIA REPAIR     BIH repair child  . KNEE ARTHROSCOPY Left   . SEPTOPLASTY    . TONSILLECTOMY      Family History  Problem Relation Age of Onset  . Alzheimer's disease Mother   . Arthritis Mother   . Arthritis Father   . Cancer Sister        breast  . Cancer Brother        melanoma  . Stroke Paternal Grandfather     Social History Social History   Tobacco Use  . Smoking status: Former Smoker    Quit date: 02/10/1980    Years since quitting: 39.5  . Smokeless tobacco: Never Used  Substance Use Topics  . Alcohol use: Yes    Alcohol/week: 14.0 standard drinks    Types: 14 Standard drinks or equivalent per week    Comment:  daily  . Drug use: No     Current Outpatient Medications  Medication Sig Dispense Refill  . acetaminophen (TYLENOL) 325 MG tablet Take 650 mg by mouth every 6 (six) hours as needed for mild pain.    Marland Kitchen donepezil (ARICEPT) 10 MG tablet TAKE ONE TABLET AT BEDTIME. 30 tablet 0  . lovastatin (MEVACOR) 20 MG tablet Take 1 tablet (20 mg total) by mouth at bedtime. 90 tablet 3  . memantine (NAMENDA) 5 MG tablet Take 1 tablet (5 mg total) by mouth 2 (two) times daily. 60 tablet 1  . tamsulosin (FLOMAX) 0.4 MG CAPS capsule TAKE (1) CAPSULE DAILY. 90 capsule 3  . traZODone (DESYREL) 50 MG tablet TAKE 1 TABLET AT BEDTIME AS NEEDED FOR SLEEP. MAY REPEAT ONCE IF STILL AWAKE. 100 tablet 3  . XARELTO 20 MG TABS tablet TAKE 1 TABLET ONCE DAILY WITH SUPPER. 90 tablet 3   No current facility-administered medications for this visit.    Allergies  Allergen Reactions  . Simvastatin Itching    REACTION: Low level CK elevation  . Warfarin Sodium Rash    Review of Systems  No bleeding problems from Xarelto Double vaccinated for COVID-19 No edema chest pain shortness of breath orthopnea or PND Weight is  stable No fevers or night sweats  BP 136/73 (BP Location: Left Arm, Patient Position: Sitting, Cuff Size: Normal)   Pulse (!) 45   Temp 97.7 F (36.5 C)   Resp 20   Ht 6\' 4"  (1.93 m)   Wt 191 lb 12.8 oz (87 kg)   SpO2 95% Comment: RA  BMI 23.35 kg/m  Physical Exam      Exam    General- alert and comfortable    Neck- no JVD, no cervical adenopathy palpable, no carotid bruit   Lungs- clear without rales, wheezes   Cor- regular rate and rhythm, soft 1/6 systolic murmur , no  gallop   Abdomen- soft, non-tender   Extremities - warm, non-tender, minimal edema   Neuro- oriented, appropriate, no focal weakness   Diagnostic Tests: CTA with results as noted above.  Stable 4.5 cm fusiform aneurysm.  No change since 2017.  Impression: Continue current heart healthy lifestyle.  We will plan on extending his interval until  the neck scan out to 18 months since the aortic diameter is main stable for several years.  Plan: Repeat CTA in 18 months.   Len Childs, MD [facility] [phone]

## 2019-09-09 ENCOUNTER — Other Ambulatory Visit: Payer: Self-pay | Admitting: Family Medicine

## 2019-09-23 ENCOUNTER — Other Ambulatory Visit: Payer: Self-pay | Admitting: Family Medicine

## 2019-09-30 ENCOUNTER — Other Ambulatory Visit: Payer: Self-pay | Admitting: Family Medicine

## 2019-09-30 DIAGNOSIS — F0390 Unspecified dementia without behavioral disturbance: Secondary | ICD-10-CM

## 2019-10-05 ENCOUNTER — Ambulatory Visit: Payer: Medicare Other

## 2019-10-17 ENCOUNTER — Ambulatory Visit: Payer: Medicare Other | Attending: Internal Medicine

## 2019-10-17 DIAGNOSIS — Z23 Encounter for immunization: Secondary | ICD-10-CM

## 2019-10-17 NOTE — Progress Notes (Signed)
   Covid-19 Vaccination Clinic  Name:  Ryan [name]    MRN: [id number] DOB: 01/23/1941  10/17/2019  Mr. Casten was observed post Covid-19 immunization for 30 minutes based on pre-vaccination screening without incident. He was provided with Vaccine Information Sheet and instruction to access the V-Safe system.   Mr. Neis was instructed to call 911 with any severe reactions post vaccine: Marland Kitchen Difficulty breathing  . Swelling of face and throat  . A fast heartbeat  . A bad rash all over body  . Dizziness and weakness

## 2019-11-07 ENCOUNTER — Other Ambulatory Visit: Payer: Self-pay | Admitting: Family Medicine

## 2020-03-26 ENCOUNTER — Other Ambulatory Visit: Payer: Self-pay | Admitting: Family Medicine

## 2020-03-26 DIAGNOSIS — E7800 Pure hypercholesterolemia, unspecified: Secondary | ICD-10-CM

## 2020-04-17 ENCOUNTER — Other Ambulatory Visit: Payer: Self-pay | Admitting: Family Medicine

## 2020-07-29 DIAGNOSIS — H26492 Other secondary cataract, left eye: Secondary | ICD-10-CM | POA: Diagnosis not present

## 2020-07-29 DIAGNOSIS — Z961 Presence of intraocular lens: Secondary | ICD-10-CM | POA: Diagnosis not present

## 2020-08-07 DIAGNOSIS — F5101 Primary insomnia: Secondary | ICD-10-CM | POA: Diagnosis not present

## 2020-08-07 DIAGNOSIS — R062 Wheezing: Secondary | ICD-10-CM | POA: Diagnosis not present

## 2020-08-07 DIAGNOSIS — E785 Hyperlipidemia, unspecified: Secondary | ICD-10-CM | POA: Diagnosis not present

## 2020-08-07 DIAGNOSIS — U071 COVID-19: Secondary | ICD-10-CM | POA: Diagnosis not present

## 2020-08-07 DIAGNOSIS — R058 Other specified cough: Secondary | ICD-10-CM | POA: Diagnosis not present

## 2020-09-16 DIAGNOSIS — R051 Acute cough: Secondary | ICD-10-CM | POA: Diagnosis not present

## 2020-09-16 DIAGNOSIS — R0781 Pleurodynia: Secondary | ICD-10-CM | POA: Diagnosis not present

## 2020-09-16 DIAGNOSIS — Z8616 Personal history of COVID-19: Secondary | ICD-10-CM | POA: Diagnosis not present

## 2020-09-16 DIAGNOSIS — R0989 Other specified symptoms and signs involving the circulatory and respiratory systems: Secondary | ICD-10-CM | POA: Diagnosis not present

## 2020-09-21 ENCOUNTER — Other Ambulatory Visit: Payer: Self-pay | Admitting: Family Medicine

## 2020-09-30 ENCOUNTER — Other Ambulatory Visit: Payer: Self-pay | Admitting: Family Medicine

## 2020-11-25 ENCOUNTER — Other Ambulatory Visit: Payer: Self-pay | Admitting: Family Medicine

## 2020-11-25 DIAGNOSIS — F0390 Unspecified dementia without behavioral disturbance: Secondary | ICD-10-CM

## 2020-11-26 DIAGNOSIS — R053 Chronic cough: Secondary | ICD-10-CM | POA: Diagnosis not present

## 2020-11-26 DIAGNOSIS — Z8679 Personal history of other diseases of the circulatory system: Secondary | ICD-10-CM | POA: Diagnosis not present

## 2020-11-26 DIAGNOSIS — Z8616 Personal history of COVID-19: Secondary | ICD-10-CM | POA: Diagnosis not present

## 2020-11-26 DIAGNOSIS — Z7689 Persons encountering health services in other specified circumstances: Secondary | ICD-10-CM | POA: Diagnosis not present

## 2020-12-05 DIAGNOSIS — I714 Abdominal aortic aneurysm, without rupture, unspecified: Secondary | ICD-10-CM | POA: Diagnosis not present

## 2020-12-25 ENCOUNTER — Other Ambulatory Visit: Payer: Self-pay | Admitting: Nurse Practitioner

## 2020-12-25 DIAGNOSIS — I719 Aortic aneurysm of unspecified site, without rupture: Secondary | ICD-10-CM

## 2020-12-29 ENCOUNTER — Other Ambulatory Visit: Payer: Self-pay | Admitting: Family Medicine

## 2021-01-07 DIAGNOSIS — R410 Disorientation, unspecified: Secondary | ICD-10-CM | POA: Diagnosis not present

## 2021-01-07 DIAGNOSIS — Z8679 Personal history of other diseases of the circulatory system: Secondary | ICD-10-CM | POA: Diagnosis not present

## 2021-01-07 DIAGNOSIS — Z87448 Personal history of other diseases of urinary system: Secondary | ICD-10-CM | POA: Diagnosis not present

## 2021-01-07 DIAGNOSIS — E785 Hyperlipidemia, unspecified: Secondary | ICD-10-CM | POA: Diagnosis not present

## 2021-01-08 DIAGNOSIS — M25571 Pain in right ankle and joints of right foot: Secondary | ICD-10-CM | POA: Diagnosis not present

## 2021-01-08 DIAGNOSIS — E559 Vitamin D deficiency, unspecified: Secondary | ICD-10-CM | POA: Diagnosis not present

## 2021-01-08 DIAGNOSIS — Z7689 Persons encountering health services in other specified circumstances: Secondary | ICD-10-CM | POA: Diagnosis not present

## 2021-01-08 DIAGNOSIS — Z86718 Personal history of other venous thrombosis and embolism: Secondary | ICD-10-CM | POA: Diagnosis not present

## 2021-01-08 DIAGNOSIS — R2241 Localized swelling, mass and lump, right lower limb: Secondary | ICD-10-CM | POA: Diagnosis not present

## 2021-01-08 DIAGNOSIS — Z713 Dietary counseling and surveillance: Secondary | ICD-10-CM | POA: Diagnosis not present

## 2021-01-08 DIAGNOSIS — E7800 Pure hypercholesterolemia, unspecified: Secondary | ICD-10-CM | POA: Diagnosis not present

## 2021-01-08 DIAGNOSIS — R944 Abnormal results of kidney function studies: Secondary | ICD-10-CM | POA: Diagnosis not present

## 2021-01-10 DIAGNOSIS — I82441 Acute embolism and thrombosis of right tibial vein: Secondary | ICD-10-CM | POA: Diagnosis not present

## 2021-01-10 DIAGNOSIS — I82431 Acute embolism and thrombosis of right popliteal vein: Secondary | ICD-10-CM | POA: Diagnosis not present

## 2021-01-10 DIAGNOSIS — M19071 Primary osteoarthritis, right ankle and foot: Secondary | ICD-10-CM | POA: Diagnosis not present

## 2021-01-13 DIAGNOSIS — Z7689 Persons encountering health services in other specified circumstances: Secondary | ICD-10-CM | POA: Diagnosis not present

## 2021-01-13 DIAGNOSIS — M79661 Pain in right lower leg: Secondary | ICD-10-CM | POA: Diagnosis not present

## 2021-01-13 DIAGNOSIS — Z86718 Personal history of other venous thrombosis and embolism: Secondary | ICD-10-CM | POA: Diagnosis not present

## 2021-01-13 DIAGNOSIS — Z7901 Long term (current) use of anticoagulants: Secondary | ICD-10-CM | POA: Diagnosis not present

## 2021-01-13 DIAGNOSIS — I82401 Acute embolism and thrombosis of unspecified deep veins of right lower extremity: Secondary | ICD-10-CM | POA: Diagnosis not present

## 2021-01-16 ENCOUNTER — Ambulatory Visit
Admission: RE | Admit: 2021-01-16 | Discharge: 2021-01-16 | Disposition: A | Discharge status: Home / Self Care | Payer: Medicare Other | Source: Ambulatory Visit | Attending: Nurse Practitioner | Admitting: Nurse Practitioner

## 2021-01-16 ENCOUNTER — Other Ambulatory Visit: Payer: Self-pay

## 2021-01-16 DIAGNOSIS — I700 Atherosclerosis of aorta: Secondary | ICD-10-CM | POA: Diagnosis not present

## 2021-01-16 DIAGNOSIS — I7121 Aneurysm of the ascending aorta, without rupture: Secondary | ICD-10-CM | POA: Diagnosis not present

## 2021-01-16 DIAGNOSIS — I719 Aortic aneurysm of unspecified site, without rupture: Secondary | ICD-10-CM

## 2021-01-16 DIAGNOSIS — I712 Thoracic aortic aneurysm, without rupture, unspecified: Secondary | ICD-10-CM | POA: Diagnosis not present

## 2021-01-20 ENCOUNTER — Telehealth: Payer: Self-pay | Admitting: Physician Assistant

## 2021-01-20 NOTE — Telephone Encounter (Signed)
Scheduled appt per 12/12 referral. Nurse practitioner called to schedule appt for pt. She will inform pt of appt date and time.

## 2021-01-22 ENCOUNTER — Other Ambulatory Visit: Payer: Self-pay | Admitting: *Deleted

## 2021-01-22 DIAGNOSIS — I7121 Aneurysm of the ascending aorta, without rupture: Secondary | ICD-10-CM

## 2021-01-23 ENCOUNTER — Inpatient Hospital Stay: Payer: Medicare Other

## 2021-01-23 ENCOUNTER — Inpatient Hospital Stay: Payer: Medicare Other | Attending: Physician Assistant | Admitting: Physician Assistant

## 2021-01-23 ENCOUNTER — Other Ambulatory Visit: Payer: Self-pay

## 2021-01-23 VITALS — BP 127/89 | HR 60 | Temp 97.3°F | Resp 17 | Wt 196.1 lb

## 2021-01-23 DIAGNOSIS — Z86718 Personal history of other venous thrombosis and embolism: Secondary | ICD-10-CM | POA: Insufficient documentation

## 2021-01-23 DIAGNOSIS — Z803 Family history of malignant neoplasm of breast: Secondary | ICD-10-CM | POA: Insufficient documentation

## 2021-01-23 DIAGNOSIS — I82409 Acute embolism and thrombosis of unspecified deep veins of unspecified lower extremity: Secondary | ICD-10-CM

## 2021-01-23 DIAGNOSIS — Z7901 Long term (current) use of anticoagulants: Secondary | ICD-10-CM | POA: Diagnosis not present

## 2021-01-23 DIAGNOSIS — Z87891 Personal history of nicotine dependence: Secondary | ICD-10-CM | POA: Diagnosis not present

## 2021-01-23 DIAGNOSIS — Z808 Family history of malignant neoplasm of other organs or systems: Secondary | ICD-10-CM | POA: Diagnosis not present

## 2021-01-23 LAB — CBC WITH DIFFERENTIAL (CANCER CENTER ONLY)
Abs Immature Granulocytes: 0.02 10*3/uL (ref 0.00–0.07)
Basophils Absolute: 0 10*3/uL (ref 0.0–0.1)
Basophils Relative: 1 %
Eosinophils Absolute: 0.1 10*3/uL (ref 0.0–0.5)
Eosinophils Relative: 1 %
HCT: 51.1 % (ref 39.0–52.0)
Hemoglobin: 17.5 g/dL — ABNORMAL HIGH (ref 13.0–17.0)
Immature Granulocytes: 0 %
Lymphocytes Relative: 23 %
Lymphs Abs: 1.1 10*3/uL (ref 0.7–4.0)
MCH: 32.4 pg (ref 26.0–34.0)
MCHC: 34.2 g/dL (ref 30.0–36.0)
MCV: 94.6 fL (ref 80.0–100.0)
Monocytes Absolute: 0.5 10*3/uL (ref 0.1–1.0)
Monocytes Relative: 11 %
Neutro Abs: 3.2 10*3/uL (ref 1.7–7.7)
Neutrophils Relative %: 64 %
Platelet Count: 188 10*3/uL (ref 150–400)
RBC: 5.4 MIL/uL (ref 4.22–5.81)
RDW: 12.7 % (ref 11.5–15.5)
WBC Count: 5 10*3/uL (ref 4.0–10.5)
nRBC: 0 % (ref 0.0–0.2)

## 2021-01-23 LAB — CMP (CANCER CENTER ONLY)
ALT: 18 U/L (ref 0–44)
AST: 23 U/L (ref 15–41)
Albumin: 4.6 g/dL (ref 3.5–5.0)
Alkaline Phosphatase: 72 U/L (ref 38–126)
Anion gap: 10 (ref 5–15)
BUN: 16 mg/dL (ref 8–23)
CO2: 26 mmol/L (ref 22–32)
Calcium: 9.6 mg/dL (ref 8.9–10.3)
Chloride: 105 mmol/L (ref 98–111)
Creatinine: 1.26 mg/dL — ABNORMAL HIGH (ref 0.61–1.24)
GFR, Estimated: 57 mL/min — ABNORMAL LOW (ref >60)
Glucose, Bld: 95 mg/dL (ref 70–99)
Potassium: 4.3 mmol/L (ref 3.5–5.1)
Sodium: 141 mmol/L (ref 135–145)
Total Bilirubin: 1.3 mg/dL — ABNORMAL HIGH (ref 0.3–1.2)
Total Protein: 8 g/dL (ref 6.5–8.1)

## 2021-01-23 MED ORDER — APIXABAN 5 MG PO TABS: ORAL_TABLET | ORAL | 3 refills | Status: DC

## 2021-01-23 NOTE — Progress Notes (Signed)
[facility] [phone]   Fax:([phone]) [place] NOTE  Patient Care Team: Garwin Brothers, MD as PCP - General (Internal Medicine) Stefanie Libel, MD (Family Medicine)  CHIEF COMPLAINTS/PURPOSE OF CONSULTATION:  Recurrent DVTs  HISTORY OF PRESENTING ILLNESS:  Ryan [name] 80 y.o. male with medical history significant for thoracic aortic aneurysm, abdominal aortic aneurysm, BPH, hyperlipidemia and dementia.  He is accompanied by his wife for this visit  On review of the previous records, Ryan [name] was on Xarelto due to history of DVTs. He was originally diagnosed with a DVT in August 2006 from the level of the mid thigh (left superficial femoral vein, a part of the deep venous system) to the level of the trifurcation, with associated marked soft tissue swelling and edema. He completed a course of anticoagulation.   His second episode occurred in July 2015 when he presented with pain and swelling of the right leg. Doppler US revealed  acute DVT involving the right lower extremity and chronic DVT involving the popliteal vein of the left lower extremity. Patient was only on ASA during the second episode so he was prescribed Xarelto and recommended to continue indefinitely.   Earlier this month, he developed right lower extremity tenderness. He underwent doppler ultrasound of the right lower extremity on 01/11/2021. Findings revealed nonocclusive thrombus in the right popliteal vein and complete thrombus in the posterior tibial vein. Patient was recommended to switch to warfarin due but he declined due history of allergic reaction of warfarin.  As result, patient is currently on Xarelto 15 mg twice daily  On exam today, Ryan [name] reports that his energy levels are stable. He tries to stay active and exercise regularly. He has a good appetite without any recent weight changes.  He denies nausea, vomiting or abdominal pain.  His bowel habits are unchanged without  diarrhea or constipation.  He denies easy bruising or signs of bleeding.  He reports mild tenderness along the medial aspect of his right lower leg without any swelling or erythema.  He denies fevers, chills, night sweats, shortness of breath, chest pain or cough.  He has no other complaints.  Rest of the 10 point ROS is below.  MEDICAL HISTORY:  Past Medical History:  Diagnosis Date   Arthritis    BPH (benign prostatic hyperplasia)    DVT (deep venous thrombosis) ([facility])    x2 "Xarelto use"   Hyperlipidemia     SURGICAL HISTORY: Past Surgical History:  Procedure Laterality Date   APPENDECTOMY     CATARACT EXTRACTION Right    COLONOSCOPY W/ POLYPECTOMY     multiple   COLONOSCOPY WITH PROPOFOL N/A 10/30/2014   Procedure: COLONOSCOPY WITH PROPOFOL;  Surgeon: Garlan Fair, MD;  Location: [facility];  Service: Endoscopy;  Laterality: N/A;   EYE SURGERY     retinal repair bilateral   HERNIA REPAIR     BIH repair child   KNEE ARTHROSCOPY Left    SEPTOPLASTY     TONSILLECTOMY      SOCIAL HISTORY: Social History   Socioeconomic History   Marital status: Married    Spouse name: [name]   Number of children: 6   Years of education: 16   Highest education level: Not on file  Occupational History   Occupation: retired-CFO  Tobacco Use   Smoking status: Former    Types: Cigarettes    Quit date: 02/10/1980    Years since quitting: 40.9   Smokeless tobacco: Never  Substance  and Sexual Activity   Alcohol use: Yes    Alcohol/week: 14.0 standard drinks    Types: 14 Standard drinks or equivalent per week    Comment:  daily   Drug use: No   Sexual activity: Not on file  Other Topics Concern   Not on file  Social History Narrative   Health Care POA:    Emergency Contact: wife, [name] [phone]   End of Life Plan: Pt reports having a end of life plan and will bring copy for next visit   Who lives with you: wife, [name]   Any pets: none   Diet: Pt has a varied diet of protein,  starch, vegetables   Exercise: Pt reports playing tennis and completing yard work at least 5 days per week   Seatbelts: Pt reports wearing seatbelt when in vehicles.    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  MEDICATIONS:  Current Outpatient Medications  Medication Sig Dispense Refill   acetaminophen (TYLENOL) 325 MG tablet Take 650 mg by mouth every 6 (six) hours as needed for mild pain.     donepezil (ARICEPT) 10 MG tablet TAKE ONE TABLET AT BEDTIME. 90 tablet 0   lovastatin (MEVACOR) 20 MG tablet TAKE ONE TABLET AT BEDTIME. 90 tablet 3   memantine (NAMENDA) 5 MG tablet TAKE 1 TABLET BY MOUTH TWICE DAILY. 180 tablet 3   tamsulosin (FLOMAX) 0.4 MG CAPS capsule TAKE (1) CAPSULE DAILY. 90 capsule 3   apixaban (ELIQUIS) 5 MG TABS tablet Take 2 tablets (10mg ) twice daily for 7 days, then 1 tablet (5mg ) twice daily 60 tablet 3   traZODone (DESYREL) 50 MG tablet TAKE 1 TABLET AT BEDTIME AS NEEDED FOR SLEEP. MAY REPEAT ONCE IF STILL AWAKE. (Patient not taking: Reported on 01/23/2021) 100 tablet 3   No current facility-administered medications for this visit.    REVIEW OF SYSTEMS:   Constitutional: ( - ) fevers, ( - )  chills , ( - ) night sweats Eyes: ( - ) blurriness of vision, ( - ) double vision, ( - ) watery eyes Ears, nose, mouth, throat, and  face: ( - ) mucositis, ( - ) sore throat Respiratory: ( - ) cough, ( - ) dyspnea, ( - ) wheezes Cardiovascular: ( - ) palpitation, ( - ) chest discomfort, ( - ) lower extremity swelling Gastrointestinal:  ( - ) nausea, ( - ) heartburn, ( - ) change in bowel habits Skin: ( - ) abnormal skin rashes Lymphatics: ( - ) new lymphadenopathy, ( - ) easy bruising Neurological: ( - ) numbness, ( - ) tingling, ( - ) new weaknesses Behavioral/Psych: ( - ) mood change, ( - ) new changes  All other systems  were reviewed with the patient and are negative.  PHYSICAL EXAMINATION: ECOG PERFORMANCE STATUS: 1 - Symptomatic but completely ambulatory  Vitals:   01/23/21 1414  BP: 127/89  Pulse: 60  Resp: 17  Temp: (!) 97.3 F (36.3 C)  SpO2: 99%   Filed Weights   01/23/21 1414  Weight: 196 lb 1.6 oz (89 kg)    GENERAL: well appearing male in NAD  SKIN: skin color, texture, turgor are normal, no rashes or significant lesions EYES: conjunctiva are pink and non-injected, sclera clear OROPHARYNX: no exudate, no erythema; lips, buccal mucosa, and tongue normal  NECK: supple, non-tender LYMPH:  no palpable lymphadenopathy in the cervical or supraclavicular lymph nodes.  LUNGS: clear to auscultation and percussion with normal breathing effort HEART: regular rate & rhythm and no murmurs and no lower extremity edema ABDOMEN: soft, non-tender, non-distended, normal bowel sounds Musculoskeletal: no cyanosis of digits and no clubbing  PSYCH: alert & oriented x 3, fluent speech NEURO: no focal motor/sensory deficits  LABORATORY DATA:  I have reviewed the data as listed CBC Latest Ref Rng & Units 01/23/2021 05/10/2019 02/23/2018  WBC 4.0 - 10.5 K/uL 5.0 6.5 6.5  Hemoglobin 13.0 - 17.0 g/dL 17.5(H) 16.4 16.5  Hematocrit 39.0 - 52.0 % 51.1 47.9 48.0  Platelets 150 - 400 K/uL 188 194 211    CMP Latest Ref Rng & Units 01/23/2021 05/10/2019 02/23/2018  Glucose 70 - 99 mg/dL 95 89 84  BUN 8 - 23 mg/dL 16 21 21    Creatinine 0.61 - 1.24 mg/dL 1.26(H) 1.28(H) 1.31(H)  Sodium 135 - 145 mmol/L 141 144 142  Potassium 3.5 - 5.1 mmol/L 4.3 4.7 4.5  Chloride 98 - 111 mmol/L 105 104 106  CO2 22 - 32 mmol/L 26 26 21   Calcium 8.9 - 10.3 mg/dL 9.6 9.5 9.4  Total Protein 6.5 - 8.1 g/dL 8.0 6.9 6.6  Total Bilirubin 0.3 - 1.2 mg/dL 1.3(H) 0.7 0.8  Alkaline Phos 38 - 126 U/L 72 69 69  AST 15 - 41 U/L 23 21 24   ALT 0 - 44 U/L 18 14 18    ASSESSMENT & PLAN Ryan [name] is a 80 y.o. male who presents for initial evaluation for recurrent DVTs currently on Xarelto 15 mg twice daily.   We reviewed risk factors that can cause VTEs including recent surgery, prolonged immobility, smoking, trauma, inflammatory process and hormone therapy. He denies any provoking risk factors that caused his DVTs.   Due to recurrent DVTs that were clearly unprovoked, the recommendation is indefinite anticoagulation. Since patient failed Xarelto with recent DVT of the right lower extremity, we recommend to switch to Eliquis.   Patient will proceed with hypercoagulable workup to rule out any clotting disorders or genetic mutations that predispose him to venous thromboembolisms.   #Recurrent DVTs, bilateral --Felt to be unprovoked without any definitive risk factors so recommend indefinite anticoagulation.  --Patient failed Xarelto with recent DVT in December 2022.  --Patient has allergy to Warfarin with reported rash.  --Recommend to switch to Eliquis. Take 10 mg (2 tablets) twice daily x 7 days and then transition to 5 mg twice daily. I sent prescription to pharmacy on file.  --Labs today to check CBC, CMP, lupus anticoagulant, beta-2 glycoprotein antibodies, cardiolipin antibodies, protein C level and activity, protein S level and activity. --RTC in 3 months with labs    Orders Placed This Encounter  Procedures   CBC with Differential ([place] Only)    Standing Status:   Future  Number of Occurrences:   1    Standing  Expiration Date:   01/23/2022   CMP ([facility] only)    Standing Status:   Future    Number of Occurrences:   1    Standing Expiration Date:   01/23/2022   Lupus anticoagulant panel*    Standing Status:   Future    Number of Occurrences:   1    Standing Expiration Date:   01/23/2022   Beta-2-glycoprotein i abs, IgG/M/A    Standing Status:   Future    Number of Occurrences:   1    Standing Expiration Date:   01/23/2022   Cardiolipin antibodies, IgG, IgM, IgA*    Standing Status:   Future    Number of Occurrences:   1    Standing Expiration Date:   01/23/2022   Protein C activity*    Standing Status:   Future    Number of Occurrences:   1    Standing Expiration Date:   01/23/2022   PROTEIN S PANEL, Total, Free, Functional Protein S    Standing Status:   Future    Number of Occurrences:   1    Standing Expiration Date:   01/23/2022   Protein C, total*    Standing Status:   Future    Number of Occurrences:   1    Standing Expiration Date:   01/23/2022    All questions were answered. The patient knows to call the clinic with any problems, questions or concerns.  I have spent a total of 60 minutes minutes of face-to-face and non-face-to-face time, preparing to see the patient, obtaining and/or reviewing separately obtained history, performing a medically appropriate examination, counseling and educating the patient, ordering medications/tests, documenting clinical information in the electronic health record, and care coordination.   Dede Query, PA-C Department of Hematology/Oncology [place] at [facility] Phone: [phone]  Patient was seen with Dr. Lorenso Courier.   I have read the above note and personally examined the patient. I agree with the assessment and plan as noted above.  Briefly Ryan [name] is an 80 year old male with medical history significant for recurrent venous thromboembolism.  Unfortunately he developed a blood clot while on Xarelto  therapy.  At this time does appear to be Xarelto failure, potentially due to missed dose or low blood levels.  Patient has been trialed on Coumadin before but developed a rash.  I would recommend that we proceed with Eliquis therapy 5 mg twice daily after loading week.  We will plan to see the patient back in 3 months time in order to assure he is tolerating this new anticoagulation therapy.   Ledell Peoples, MD Department of Hematology/Oncology [place] at [facility] Phone: [phone] Pager: [phone] Email: [email or web address]

## 2021-01-24 LAB — BETA-2-GLYCOPROTEIN I ABS, IGG/M/A
Beta-2 Glyco I IgG: <9 GPI IgG units (ref 0–20)
Beta-2-Glycoprotein I IgA: <9 GPI IgA units (ref 0–25)
Beta-2-Glycoprotein I IgM: <9 GPI IgM units (ref 0–32)

## 2021-01-24 LAB — CARDIOLIPIN ANTIBODIES, IGG, IGM, IGA
Anticardiolipin IgA: <9 APL U/mL (ref 0–11)
Anticardiolipin IgG: <9 GPL U/mL (ref 0–14)
Anticardiolipin IgM: 23 MPL U/mL — ABNORMAL HIGH (ref 0–12)

## 2021-01-27 LAB — LUPUS ANTICOAGULANT PANEL
DRVVT: 163.7 s — ABNORMAL HIGH (ref 0.0–47.0)
PTT Lupus Anticoagulant: 127 s — ABNORMAL HIGH (ref 0.0–51.9)

## 2021-01-27 LAB — DRVVT CONFIRM: dRVVT Confirm: 2 ratio — ABNORMAL HIGH (ref 0.8–1.2)

## 2021-01-27 LAB — PTT-LA MIX: PTT-LA Mix: 56.4 s — ABNORMAL HIGH (ref 0.0–48.9)

## 2021-01-27 LAB — PROTEIN C, TOTAL: Protein C, Total: 124 % (ref 60–150)

## 2021-01-27 LAB — DRVVT MIX: dRVVT Mix: 97.8 s — ABNORMAL HIGH (ref 0.0–40.4)

## 2021-01-27 LAB — PROTEIN C ACTIVITY: Protein C Activity: 112 % (ref 73–180)

## 2021-01-27 LAB — HEXAGONAL PHASE PHOSPHOLIPID: Hexagonal Phase Phospholipid: 45 s — ABNORMAL HIGH (ref 0–11)

## 2021-01-27 LAB — PROTEIN S PANEL
Protein S Activity: 74 % (ref 63–140)
Protein S Ag, Free: 109 % (ref 61–136)
Protein S Ag, Total: 105 % (ref 60–150)

## 2021-02-11 ENCOUNTER — Telehealth: Payer: Self-pay | Admitting: *Deleted

## 2021-02-11 NOTE — Telephone Encounter (Signed)
Per pt wife, wanted labs faxed to [facility] [phone]. Confirmation received

## 2021-02-17 ENCOUNTER — Telehealth: Payer: Self-pay | Admitting: Physician Assistant

## 2021-02-17 ENCOUNTER — Telehealth: Payer: Self-pay | Admitting: *Deleted

## 2021-02-17 ENCOUNTER — Telehealth: Payer: Self-pay | Admitting: Hematology and Oncology

## 2021-02-17 NOTE — Telephone Encounter (Signed)
I called wife of Mr. Ryan, [name], to review the lab results from 01/23/2021. There is no evidence of a clotting disorder. The recommendation is to continue with Eliquis 5 mg twice daily. We will plan for patient to have a follow up with Dr. Lorenso Courier in 3 months. [name] expressed understanding of the plan provided.

## 2021-02-17 NOTE — Telephone Encounter (Signed)
Received call from pt's wife inquiring about lab results from 01/23/21.  Pt's PCP and pt's wife would like to know evaluation of his labs,  Please advise.

## 2021-02-17 NOTE — Telephone Encounter (Signed)
Scheduled per 1/9 secure chat, pts wife has been called and confirmed appt

## 2021-02-24 ENCOUNTER — Encounter: Payer: Medicare Other | Admitting: Cardiothoracic Surgery

## 2021-03-03 ENCOUNTER — Ambulatory Visit: Payer: Medicare Other | Admitting: Cardiothoracic Surgery

## 2021-03-03 ENCOUNTER — Encounter: Payer: Self-pay | Admitting: Cardiothoracic Surgery

## 2021-03-03 ENCOUNTER — Ambulatory Visit
Admission: RE | Admit: 2021-03-03 | Discharge: 2021-03-03 | Disposition: A | Discharge status: Home / Self Care | Payer: Medicare Other | Source: Ambulatory Visit | Attending: Cardiothoracic Surgery | Admitting: Cardiothoracic Surgery

## 2021-03-03 ENCOUNTER — Other Ambulatory Visit: Payer: Self-pay

## 2021-03-03 VITALS — BP 141/81 | HR 57 | Resp 20 | Ht 76.0 in | Wt 195.8 lb

## 2021-03-03 DIAGNOSIS — M314 Aortic arch syndrome [Takayasu]: Secondary | ICD-10-CM | POA: Diagnosis not present

## 2021-03-03 DIAGNOSIS — I700 Atherosclerosis of aorta: Secondary | ICD-10-CM | POA: Diagnosis not present

## 2021-03-03 DIAGNOSIS — I712 Thoracic aortic aneurysm, without rupture, unspecified: Secondary | ICD-10-CM | POA: Diagnosis not present

## 2021-03-03 DIAGNOSIS — I7121 Aneurysm of the ascending aorta, without rupture: Secondary | ICD-10-CM

## 2021-03-03 DIAGNOSIS — I2510 Atherosclerotic heart disease of native coronary artery without angina pectoris: Secondary | ICD-10-CM | POA: Diagnosis not present

## 2021-03-03 MED ORDER — IOPAMIDOL (ISOVUE-370) INJECTION 76%
75.0000 mL | Freq: Once | INTRAVENOUS | Status: AC | PRN
  Administered 2021-03-03: 75 mL via INTRAVENOUS

## 2021-03-03 NOTE — Progress Notes (Signed)
PCP is Garwin Brothers, MD Referring Provider is Zenia Resides, MD  Chief Complaint  Patient presents with   Thoracic Aortic Aneurysm    18 month f/u with CTA chest  Patient returns for routine scheduled follow-up visit with CTA  HPI: 81 year old gentleman returns for follow-up visit with CTA for an asymptomatic 4.5 fusiform ascending aneurysm first noted as an incidental finding in 2017.  It is not changed since then.  He is a non-smoker and does not have hypertension.  He denies any symptoms of chest pain or palpitations in the interval since his last visit in 2021.  Personally reviewed the CTA images and the ascending aorta remains at 4.5 cm.  There is no evidence of ulceration or thickening or hematoma.  Patient has a history of lower extremity DVT and was recently evaluated by hematology for recurrent lower leg DVT in spite of being on Xarelto.  A hypercoagulable evaluation was ordered and the patient was placed on Eliquis which she is taking currently without side effects.   Past Medical History:  Diagnosis Date   Arthritis    BPH (benign prostatic hyperplasia)    DVT (deep venous thrombosis) ([facility])    x2 "Xarelto use"   Hyperlipidemia     Past Surgical History:  Procedure Laterality Date   APPENDECTOMY     CATARACT EXTRACTION Right    COLONOSCOPY W/ POLYPECTOMY     multiple   COLONOSCOPY WITH PROPOFOL N/A 10/30/2014   Procedure: COLONOSCOPY WITH PROPOFOL;  Surgeon: Garlan Fair, MD;  Location: [facility];  Service: Endoscopy;  Laterality: N/A;   EYE SURGERY     retinal repair bilateral   HERNIA REPAIR     BIH repair child   KNEE ARTHROSCOPY Left    SEPTOPLASTY     TONSILLECTOMY      Family History  Problem Relation Age of Onset   Alzheimer's disease Mother    Arthritis Mother    Arthritis Father    Cancer Sister        breast   Cancer Brother        melanoma   Stroke Paternal Grandfather     Social History Social History   Tobacco Use   Smoking  status: Former    Types: Cigarettes    Quit date: 02/10/1980    Years since quitting: 41.0   Smokeless tobacco: Never  Substance Use Topics   Alcohol use: Yes    Alcohol/week: 14.0 standard drinks    Types: 14 Standard drinks or equivalent per week    Comment:  daily   Drug use: No    Current Outpatient Medications  Medication Sig Dispense Refill   acetaminophen (TYLENOL) 325 MG tablet Take 650 mg by mouth every 6 (six) hours as needed for mild pain.     apixaban (ELIQUIS) 5 MG TABS tablet Take 2 tablets (10mg ) twice daily for 7 days, then 1 tablet (5mg ) twice daily 60 tablet 3   donepezil (ARICEPT) 10 MG tablet TAKE ONE TABLET AT BEDTIME. 90 tablet 0   lovastatin (MEVACOR) 20 MG tablet TAKE ONE TABLET AT BEDTIME. 90 tablet 3   memantine (NAMENDA) 5 MG tablet TAKE 1 TABLET BY MOUTH TWICE DAILY. 180 tablet 3   tamsulosin (FLOMAX) 0.4 MG CAPS capsule TAKE (1) CAPSULE DAILY. 90 capsule 3   traZODone (DESYREL) 50 MG tablet TAKE 1 TABLET AT BEDTIME AS NEEDED FOR SLEEP. MAY REPEAT ONCE IF STILL AWAKE. 100 tablet 3   No current facility-administered medications for this  visit.    Allergies  Allergen Reactions   Simvastatin Itching    REACTION: Low level CK elevation   Warfarin Sodium Rash    Review of Systems Weight stable about 200 pounds No chest or back pain No syncope or falls No palpitation or chest pain or shortness of breath No lower leg edema No complications of bleeding from the Eliquis  BP (!) 141/81 (BP Location: Right Arm, Patient Position: Sitting, Cuff Size: Normal)    Pulse (!) 57    Resp 20    Ht 6\' 4"  (1.93 m)    Wt 195 lb 12.8 oz (88.8 kg)    SpO2 96% Comment: RA   BMI 23.83 kg/m  Physical Exam      Exam    General- alert and comfortable    Neck- no JVD, no cervical adenopathy palpable, no carotid bruit   Lungs- clear without rales, wheezes   Cor- regular rate and rhythm, soft 2/6 systolic murmur , nogallop   Abdomen- soft, non-tender   Extremities - warm,  non-tender, minimal edema   Neuro- oriented, appropriate, no focal weakness   Diagnostic Tests: CTA chest images personally reviewed which showed no change in the chronic asymptomatic moderate fusiform ascending aneurysm  Impression: Surgical intervention not recommended for his stable moderate ascending aneurysm which has been at 4.5 cm for several years.  He understands importance of keeping his blood pressure regulated less than 140 to 150 mmHg and to continue his statin.  Plan: We will schedule a surveillance CTA in the next 12-18 months.  Surgery would not be considered in this patient unless the aortic diameter exceeded 5.0 cm as the risk of rupture/tear currently is lower than the risk of surgery. Dahlia Byes, MD [facility] [phone]

## 2021-03-27 ENCOUNTER — Other Ambulatory Visit: Payer: Self-pay | Admitting: Family Medicine

## 2021-03-27 DIAGNOSIS — E7800 Pure hypercholesterolemia, unspecified: Secondary | ICD-10-CM

## 2021-04-04 NOTE — Telephone Encounter (Signed)
[facility] called nurse line to FU on this. Unsure if he is still our patient.   Please advise.

## 2021-04-10 ENCOUNTER — Other Ambulatory Visit: Payer: Self-pay

## 2021-04-10 ENCOUNTER — Inpatient Hospital Stay: Payer: Medicare Other | Admitting: Hematology and Oncology

## 2021-04-10 ENCOUNTER — Other Ambulatory Visit: Payer: Self-pay | Admitting: Hematology and Oncology

## 2021-04-10 ENCOUNTER — Inpatient Hospital Stay: Payer: Medicare Other | Attending: Physician Assistant

## 2021-04-10 VITALS — BP 132/73 | HR 45 | Temp 96.8°F | Resp 18 | Wt 198.1 lb

## 2021-04-10 DIAGNOSIS — Z7901 Long term (current) use of anticoagulants: Secondary | ICD-10-CM | POA: Diagnosis not present

## 2021-04-10 DIAGNOSIS — I82409 Acute embolism and thrombosis of unspecified deep veins of unspecified lower extremity: Secondary | ICD-10-CM

## 2021-04-10 DIAGNOSIS — Z808 Family history of malignant neoplasm of other organs or systems: Secondary | ICD-10-CM | POA: Diagnosis not present

## 2021-04-10 DIAGNOSIS — I82533 Chronic embolism and thrombosis of popliteal vein, bilateral: Secondary | ICD-10-CM | POA: Insufficient documentation

## 2021-04-10 DIAGNOSIS — Z87891 Personal history of nicotine dependence: Secondary | ICD-10-CM | POA: Diagnosis not present

## 2021-04-10 DIAGNOSIS — Z86718 Personal history of other venous thrombosis and embolism: Secondary | ICD-10-CM

## 2021-04-10 LAB — CBC WITH DIFFERENTIAL (CANCER CENTER ONLY)
Abs Immature Granulocytes: 0.01 10*3/uL (ref 0.00–0.07)
Basophils Absolute: 0.1 10*3/uL (ref 0.0–0.1)
Basophils Relative: 1 %
Eosinophils Absolute: 0.1 10*3/uL (ref 0.0–0.5)
Eosinophils Relative: 1 %
HCT: 48.1 % (ref 39.0–52.0)
Hemoglobin: 16.3 g/dL (ref 13.0–17.0)
Immature Granulocytes: 0 %
Lymphocytes Relative: 27 %
Lymphs Abs: 1.1 10*3/uL (ref 0.7–4.0)
MCH: 32.3 pg (ref 26.0–34.0)
MCHC: 33.9 g/dL (ref 30.0–36.0)
MCV: 95.4 fL (ref 80.0–100.0)
Monocytes Absolute: 0.5 10*3/uL (ref 0.1–1.0)
Monocytes Relative: 11 %
Neutro Abs: 2.4 10*3/uL (ref 1.7–7.7)
Neutrophils Relative %: 60 %
Platelet Count: 173 10*3/uL (ref 150–400)
RBC: 5.04 MIL/uL (ref 4.22–5.81)
RDW: 12.9 % (ref 11.5–15.5)
WBC Count: 4.2 10*3/uL (ref 4.0–10.5)
nRBC: 0 % (ref 0.0–0.2)

## 2021-04-10 LAB — CMP (CANCER CENTER ONLY)
ALT: 17 U/L (ref 0–44)
AST: 20 U/L (ref 15–41)
Albumin: 4 g/dL (ref 3.5–5.0)
Alkaline Phosphatase: 59 U/L (ref 38–126)
Anion gap: 6 (ref 5–15)
BUN: 20 mg/dL (ref 8–23)
CO2: 28 mmol/L (ref 22–32)
Calcium: 9.7 mg/dL (ref 8.9–10.3)
Chloride: 108 mmol/L (ref 98–111)
Creatinine: 1.36 mg/dL — ABNORMAL HIGH (ref 0.61–1.24)
GFR, Estimated: 52 mL/min — ABNORMAL LOW (ref >60)
Glucose, Bld: 96 mg/dL (ref 70–99)
Potassium: 4.7 mmol/L (ref 3.5–5.1)
Sodium: 142 mmol/L (ref 135–145)
Total Bilirubin: 0.9 mg/dL (ref 0.3–1.2)
Total Protein: 6.8 g/dL (ref 6.5–8.1)

## 2021-04-10 NOTE — Progress Notes (Signed)
[place] Telephone:(336) [phone]   Fax:(336) [phone]  PROGRESS NOTE  Patient Care Team: Garwin Brothers, MD as PCP - General (Internal Medicine) Stefanie Libel, MD (Family Medicine)  Hematological/Oncological History # Recurrent Lower Extremity DVT August 2006: originally diagnosed with a DVT from the level of the mid thigh (left superficial femoral vein, a part of the deep venous system) to the level of the trifurcation. Completed short course of anticoagulation July 2015 : he presented with pain and swelling of the right leg. Doppler US revealed  acute DVT involving the right lower extremity and chronic DVT involving the popliteal vein of the left lower extremity. Started on indefinite Xarelto 01/11/2021: underwent doppler ultrasound of the right lower extremity.  Findings revealed nonocclusive thrombus in the right popliteal vein and complete thrombus in the posterior tibial vein 01/23/2021: Establish care with Dede Query.  Transition to Eliquis therapy.  Interval History:  Ryan [name] 81 y.o. male with medical history significant for recurrent lower extremity DVT presents for a follow up visit. The patient's last visit was on 01/23/2021. In the interim since the last visit he has continued on Eliquis therapy without difficulty.  On exam today Ryan [name] is accompanied by his daughter.  He reports he has been well in the interim since her last visit.  He has been taking Eliquis therapy 5 mg twice daily without any difficulty.  He is not having any bleeding, bruising, or dark stools.  He has not had any missed doses.  He does have a chronic cough productive for white phlegm.  He notes that the cost of the medication is not particularly inhibitive.  He does not currently have any signs or symptoms concerning for recurrent VTE.  He denies any fevers, chills, sweats, nausea, ming or diarrhea.  A full 10 point ROS is listed below.  MEDICAL HISTORY:  Past Medical History:   Diagnosis Date   Arthritis    BPH (benign prostatic hyperplasia)    DVT (deep venous thrombosis) (HCC)    x2 "Xarelto use"   Hyperlipidemia     SURGICAL HISTORY: Past Surgical History:  Procedure Laterality Date   APPENDECTOMY     CATARACT EXTRACTION Right    COLONOSCOPY W/ POLYPECTOMY     multiple   COLONOSCOPY WITH PROPOFOL N/A 10/30/2014   Procedure: COLONOSCOPY WITH PROPOFOL;  Surgeon: Garlan Fair, MD;  Location: [facility];  Service: Endoscopy;  Laterality: N/A;   EYE SURGERY     retinal repair bilateral   HERNIA REPAIR     BIH repair child   KNEE ARTHROSCOPY Left    SEPTOPLASTY     TONSILLECTOMY      SOCIAL HISTORY: Social History   Socioeconomic History   Marital status: Married    Spouse name: Ryan [name]   Number of children: 6   Years of education: 16   Highest education level: Not on file  Occupational History   Occupation: retired-CFO  Tobacco Use   Smoking status: Former    Types: Cigarettes    Quit date: 02/10/1980    Years since quitting: 41.1   Smokeless tobacco: Never  Substance and Sexual Activity   Alcohol use: Yes    Alcohol/week: 14.0 standard drinks    Types: 14 Standard drinks or equivalent per week    Comment:  daily   Drug use: No   Sexual activity: Not on file  Other Topics Concern   Not on file  Social History Narrative   Health Care POA:  Emergency Contact: wife, Ryan [name] [phone]   End of Life Plan: Pt reports having a end of life plan and will bring copy for next visit   Who lives with you: wife, Ryan [name]   Any pets: none   Diet: Pt has a varied diet of protein, starch, vegetables   Exercise: Pt reports playing tennis and completing yard work at least 5 days per week   Seatbelts: Pt reports wearing seatbelt when in vehicles.    Sun Exposure/Protection: Pt reports wearing ball cap but no sun block   Hobbies: tennis, biking, volunteering, yard work    Investment banker, operational of Radio broadcast assistant Strain: Not on [employer] Insecurity: Not on file  Transportation Needs: Not on file  Physical Activity: Not on file  Stress: Not on file  Social Connections: Not on file  Intimate Partner Violence: Not on file    FAMILY HISTORY: Family History  Problem Relation Age of Onset   Alzheimer's disease Mother    Arthritis Mother    Arthritis Father    Cancer Sister        breast   Cancer Brother        melanoma   Stroke Paternal Grandfather     ALLERGIES:  is allergic to simvastatin and warfarin sodium.  MEDICATIONS:  Current Outpatient Medications  Medication Sig Dispense Refill   acetaminophen (TYLENOL) 325 MG tablet Take 650 mg by mouth every 6 (six) hours as needed for mild pain.     apixaban (ELIQUIS) 5 MG TABS tablet Take 2 tablets (10mg ) twice daily for 7 days, then 1 tablet (5mg ) twice daily 60 tablet 3   donepezil (ARICEPT) 10 MG tablet TAKE ONE TABLET AT BEDTIME. 90 tablet 0   lovastatin (MEVACOR) 20 MG tablet TAKE ONE TABLET AT BEDTIME. 90 tablet 3   memantine (NAMENDA) 5 MG tablet TAKE 1 TABLET BY MOUTH TWICE DAILY. 180 tablet 3   tamsulosin (FLOMAX) 0.4 MG CAPS capsule TAKE (1) CAPSULE DAILY. 90 capsule 3   traZODone (DESYREL) 50 MG tablet TAKE 1 TABLET AT BEDTIME AS NEEDED FOR SLEEP. MAY REPEAT ONCE IF STILL AWAKE. 100 tablet 3   No current facility-administered medications for this visit.    REVIEW OF SYSTEMS:   Constitutional: ( - ) fevers, ( - )  chills , ( - ) night sweats Eyes: ( - ) blurriness of vision, ( - ) double vision, ( - ) watery eyes Ears, nose, mouth, throat, and face: ( - ) mucositis, ( - ) sore throat Respiratory: ( - ) cough, ( - ) dyspnea, ( - ) wheezes Cardiovascular: ( - ) palpitation, ( - ) chest discomfort, ( - ) lower extremity swelling Gastrointestinal:  ( - ) nausea, ( - ) heartburn, ( - ) change in bowel habits Skin: ( - ) abnormal skin rashes Lymphatics: ( - ) new lymphadenopathy, ( - ) easy bruising Neurological: ( - ) numbness, ( - ) tingling, ( - )  new weaknesses Behavioral/Psych: ( - ) mood change, ( - ) new changes  All other systems were reviewed with the patient and are negative.  PHYSICAL EXAMINATION:  Vitals:   04/10/21 1442  BP: 132/73  Pulse: (!) 45  Resp: 18  Temp: (!) 96.8 F (36 C)  SpO2: 98%   Filed Weights   04/10/21 1442  Weight: 198 lb 2 oz (89.9 kg)    GENERAL: Well-appearing elderly Caucasian male, alert, no distress and comfortable SKIN: skin color, texture, turgor  are normal, no rashes or significant lesions EYES: conjunctiva are pink and non-injected, sclera clear LUNGS: clear to auscultation and percussion with normal breathing effort HEART: regular rate & rhythm and no murmurs and no lower extremity edema Musculoskeletal: no cyanosis of digits and no clubbing  PSYCH: alert & oriented x 3, fluent speech NEURO: no focal motor/sensory deficits  LABORATORY DATA:  I have reviewed the data as listed CBC Latest Ref Rng & Units 04/10/2021 01/23/2021 05/10/2019  WBC 4.0 - 10.5 K/uL 4.2 5.0 6.5  Hemoglobin 13.0 - 17.0 g/dL 16.3 17.5(H) 16.4  Hematocrit 39.0 - 52.0 % 48.1 51.1 47.9  Platelets 150 - 400 K/uL 173 188 194    CMP Latest Ref Rng & Units 04/10/2021 01/23/2021 05/10/2019  Glucose 70 - 99 mg/dL 96 95 89  BUN 8 - 23 mg/dL 20 16 21   Creatinine 0.61 - 1.24 mg/dL 1.36(H) 1.26(H) 1.28(H)  Sodium 135 - 145 mmol/L 142 141 144  Potassium 3.5 - 5.1 mmol/L 4.7 4.3 4.7  Chloride 98 - 111 mmol/L 108 105 104  CO2 22 - 32 mmol/L 28 26 26   Calcium 8.9 - 10.3 mg/dL 9.7 9.6 9.5  Total Protein 6.5 - 8.1 g/dL 6.8 8.0 6.9  Total Bilirubin 0.3 - 1.2 mg/dL 0.9 1.3(H) 0.7  Alkaline Phos 38 - 126 U/L 59 72 69  AST 15 - 41 U/L 20 23 21   ALT 0 - 44 U/L 17 18 14     RADIOGRAPHIC STUDIES: No results found.  ASSESSMENT & PLAN KAHLE MCQUEEN 81 y.o. male with medical history significant for recurrent lower extremity DVT presents for a follow up visit.  Due to recurrent DVTs that were clearly unprovoked, the  recommendation is indefinite anticoagulation. Since patient failed Xarelto with recent DVT of the right lower extremity, we recommended to switch to Eliquis.    Patient underwent a hypercoagulable workup to rule out any clotting disorders or genetic mutations that predispose him to venous thromboembolisms.  The results of these tests showed no evidence of a hypercoagulable disorder.   #Recurrent DVTs, bilateral --Felt to be unprovoked without any definitive risk factors so recommend indefinite anticoagulation.  --Patient failed Xarelto with recent DVT in December 2022.  --Patient has allergy to Warfarin with reported rash.  --Recommend continuation of Eliquis 5 mg twice daily. --Labs today to check CBC, CMP, -- hypercoagulation workup with lupus anticoagulant, beta-2 glycoprotein antibodies, cardiolipin antibodies, protein C level and activity, protein S level and activity showed no abnormalities.  --RTC in 3 months with labs.  Can discuss maintenance dose Eliquis at that time.  No orders of the defined types were placed in this encounter.   All questions were answered. The patient knows to call the clinic with any problems, questions or concerns.  A total of more than 30 minutes were spent on this encounter with face-to-face time and non-face-to-face time, including preparing to see the patient, ordering tests and/or medications, counseling the patient and coordination of care as outlined above.   Ledell Peoples, MD Department of Hematology/Oncology [place] at [facility] Phone: [phone] Pager: [phone] Email: [email or web address]  04/10/2021 4:19 PM

## 2021-04-11 ENCOUNTER — Telehealth: Payer: Self-pay | Admitting: Hematology and Oncology

## 2021-04-11 NOTE — Telephone Encounter (Signed)
Scheduled per 3/2 los, message has been left with pt ?

## 2021-04-22 DIAGNOSIS — R2689 Other abnormalities of gait and mobility: Secondary | ICD-10-CM | POA: Diagnosis not present

## 2021-04-29 DIAGNOSIS — R2689 Other abnormalities of gait and mobility: Secondary | ICD-10-CM | POA: Diagnosis not present

## 2021-04-29 DIAGNOSIS — M6281 Muscle weakness (generalized): Secondary | ICD-10-CM | POA: Diagnosis not present

## 2021-06-17 DIAGNOSIS — L309 Dermatitis, unspecified: Secondary | ICD-10-CM | POA: Diagnosis not present

## 2021-06-17 DIAGNOSIS — M7592 Shoulder lesion, unspecified, left shoulder: Secondary | ICD-10-CM | POA: Diagnosis not present

## 2021-06-24 DIAGNOSIS — L821 Other seborrheic keratosis: Secondary | ICD-10-CM | POA: Diagnosis not present

## 2021-06-24 DIAGNOSIS — L814 Other melanin hyperpigmentation: Secondary | ICD-10-CM | POA: Diagnosis not present

## 2021-06-24 DIAGNOSIS — R21 Rash and other nonspecific skin eruption: Secondary | ICD-10-CM | POA: Diagnosis not present

## 2021-07-10 ENCOUNTER — Inpatient Hospital Stay: Payer: Medicare Other | Admitting: Hematology and Oncology

## 2021-07-10 ENCOUNTER — Inpatient Hospital Stay: Payer: Medicare Other | Attending: Physician Assistant

## 2021-07-10 ENCOUNTER — Other Ambulatory Visit: Payer: Self-pay | Admitting: Hematology and Oncology

## 2021-07-10 ENCOUNTER — Other Ambulatory Visit: Payer: Self-pay

## 2021-07-10 VITALS — BP 122/64 | HR 63 | Temp 98.0°F | Resp 18 | Ht 76.0 in | Wt 202.0 lb

## 2021-07-10 DIAGNOSIS — Z86718 Personal history of other venous thrombosis and embolism: Secondary | ICD-10-CM | POA: Diagnosis not present

## 2021-07-10 DIAGNOSIS — Z87891 Personal history of nicotine dependence: Secondary | ICD-10-CM | POA: Diagnosis not present

## 2021-07-10 DIAGNOSIS — I82409 Acute embolism and thrombosis of unspecified deep veins of unspecified lower extremity: Secondary | ICD-10-CM

## 2021-07-10 DIAGNOSIS — I82533 Chronic embolism and thrombosis of popliteal vein, bilateral: Secondary | ICD-10-CM | POA: Insufficient documentation

## 2021-07-10 DIAGNOSIS — Z803 Family history of malignant neoplasm of breast: Secondary | ICD-10-CM | POA: Insufficient documentation

## 2021-07-10 DIAGNOSIS — Z7901 Long term (current) use of anticoagulants: Secondary | ICD-10-CM | POA: Diagnosis not present

## 2021-07-10 LAB — CBC WITH DIFFERENTIAL (CANCER CENTER ONLY)
Abs Immature Granulocytes: 0.01 10*3/uL (ref 0.00–0.07)
Basophils Absolute: 0 10*3/uL (ref 0.0–0.1)
Basophils Relative: 1 %
Eosinophils Absolute: 0 10*3/uL (ref 0.0–0.5)
Eosinophils Relative: 1 %
HCT: 47.9 % (ref 39.0–52.0)
Hemoglobin: 16.7 g/dL (ref 13.0–17.0)
Immature Granulocytes: 0 %
Lymphocytes Relative: 29 %
Lymphs Abs: 1.3 10*3/uL (ref 0.7–4.0)
MCH: 32.6 pg (ref 26.0–34.0)
MCHC: 34.9 g/dL (ref 30.0–36.0)
MCV: 93.6 fL (ref 80.0–100.0)
Monocytes Absolute: 0.5 10*3/uL (ref 0.1–1.0)
Monocytes Relative: 11 %
Neutro Abs: 2.8 10*3/uL (ref 1.7–7.7)
Neutrophils Relative %: 58 %
Platelet Count: 179 10*3/uL (ref 150–400)
RBC: 5.12 MIL/uL (ref 4.22–5.81)
RDW: 12.9 % (ref 11.5–15.5)
WBC Count: 4.7 10*3/uL (ref 4.0–10.5)
nRBC: 0 % (ref 0.0–0.2)

## 2021-07-10 LAB — CMP (CANCER CENTER ONLY)
ALT: 20 U/L (ref 0–44)
AST: 22 U/L (ref 15–41)
Albumin: 4.5 g/dL (ref 3.5–5.0)
Alkaline Phosphatase: 54 U/L (ref 38–126)
Anion gap: 6 (ref 5–15)
BUN: 21 mg/dL (ref 8–23)
CO2: 28 mmol/L (ref 22–32)
Calcium: 9.7 mg/dL (ref 8.9–10.3)
Chloride: 105 mmol/L (ref 98–111)
Creatinine: 1.11 mg/dL (ref 0.61–1.24)
GFR, Estimated: >60 mL/min (ref >60)
Glucose, Bld: 123 mg/dL — ABNORMAL HIGH (ref 70–99)
Potassium: 4.1 mmol/L (ref 3.5–5.1)
Sodium: 139 mmol/L (ref 135–145)
Total Bilirubin: 1.2 mg/dL (ref 0.3–1.2)
Total Protein: 7.2 g/dL (ref 6.5–8.1)

## 2021-07-10 NOTE — Progress Notes (Signed)
[place] Telephone:(336) [phone]   Fax:(336) [phone]  PROGRESS NOTE  Patient Care Team: Garwin Brothers, MD as PCP - General (Internal Medicine) Stefanie Libel, MD (Family Medicine)  Hematological/Oncological History # Recurrent Lower Extremity DVT August 2006: originally diagnosed with a DVT from the level of the mid thigh (left superficial femoral vein, a part of the deep venous system) to the level of the trifurcation. Completed short course of anticoagulation July 2015 : he presented with pain and swelling of the right leg. Doppler US revealed  acute DVT involving the right lower extremity and chronic DVT involving the popliteal vein of the left lower extremity. Started on indefinite Xarelto 01/11/2021: underwent doppler ultrasound of the right lower extremity.  Findings revealed nonocclusive thrombus in the right popliteal vein and complete thrombus in the posterior tibial vein 01/23/2021: Establish care with Dede Query.  Transition to Eliquis therapy.  Interval History:  Ryan [name] 81 y.o. male with medical history significant for recurrent lower extremity DVT presents for a follow up visit. The patient's last visit was on 04/10/2021. In the interim since the last visit he has continued on Eliquis therapy without difficulty.  On exam today Ryan [name] is accompanied by his daughter.  He reports he has been well overall in the interim since her last visit.  He is been taking his Eliquis but not always as faithfully as his family would like.  He reports he is not having any bleeding on this medication.  He notes he is not having any chest pain, shortness of breath, or other signs or symptoms concerning for recurrent VTE.  He notes that he did develop 3 blisters on her shoulder which are currently in the process of resolving. He denies any fevers, chills, sweats, nausea, ming or diarrhea.  A full 10 point ROS is listed below.  MEDICAL HISTORY:  Past Medical History:   Diagnosis Date   Arthritis    BPH (benign prostatic hyperplasia)    DVT (deep venous thrombosis) (HCC)    x2 "Xarelto use"   Hyperlipidemia     SURGICAL HISTORY: Past Surgical History:  Procedure Laterality Date   APPENDECTOMY     CATARACT EXTRACTION Right    COLONOSCOPY W/ POLYPECTOMY     multiple   COLONOSCOPY WITH PROPOFOL N/A 10/30/2014   Procedure: COLONOSCOPY WITH PROPOFOL;  Surgeon: Garlan Fair, MD;  Location: [facility];  Service: Endoscopy;  Laterality: N/A;   EYE SURGERY     retinal repair bilateral   HERNIA REPAIR     BIH repair child   KNEE ARTHROSCOPY Left    SEPTOPLASTY     TONSILLECTOMY      SOCIAL HISTORY: Social History   Socioeconomic History   Marital status: Married    Spouse name: [name]   Number of children: 6   Years of education: 16   Highest education level: Not on file  Occupational History   Occupation: retired-CFO  Tobacco Use   Smoking status: Former    Types: Cigarettes    Quit date: 02/10/1980    Years since quitting: 41.4   Smokeless tobacco: Never  Substance and Sexual Activity   Alcohol use: Yes    Alcohol/week: 14.0 standard drinks    Types: 14 Standard drinks or equivalent per week    Comment:  daily   Drug use: No   Sexual activity: Not on file  Other Topics Concern   Not on file  Social History Narrative   Health Care POA:  Emergency Contact: wife, [name] [phone]   End of Life Plan: Pt reports having a end of life plan and will bring copy for next visit   Who lives with you: wife, [name]   Any pets: none   Diet: Pt has a varied diet of protein, starch, vegetables   Exercise: Pt reports playing tennis and completing yard work at least 5 days per week   Seatbelts: Pt reports wearing seatbelt when in vehicles.    Sun Exposure/Protection: Pt reports wearing ball cap but no sun block   Hobbies: tennis, biking, volunteering, yard work    Investment banker, operational of Radio broadcast assistant Strain: Not on [employer] Insecurity: Not on file  Transportation Needs: Not on file  Physical Activity: Not on file  Stress: Not on file  Social Connections: Not on file  Intimate Partner Violence: Not on file    FAMILY HISTORY: Family History  Problem Relation Age of Onset   Alzheimer's disease Mother    Arthritis Mother    Arthritis Father    Cancer Sister        breast   Cancer Brother        melanoma   Stroke Paternal Grandfather     ALLERGIES:  is allergic to simvastatin and warfarin sodium.  MEDICATIONS:  Current Outpatient Medications  Medication Sig Dispense Refill   acetaminophen (TYLENOL) 325 MG tablet Take 650 mg by mouth every 6 (six) hours as needed for mild pain.     apixaban (ELIQUIS) 5 MG TABS tablet Take 2 tablets ('10mg'$ ) twice daily for 7 days, then 1 tablet ('5mg'$ ) twice daily 60 tablet 3   donepezil (ARICEPT) 10 MG tablet TAKE ONE TABLET AT BEDTIME. 90 tablet 0   lovastatin (MEVACOR) 20 MG tablet TAKE ONE TABLET AT BEDTIME. 90 tablet 3   memantine (NAMENDA) 5 MG tablet TAKE 1 TABLET BY MOUTH TWICE DAILY. 180 tablet 3   tamsulosin (FLOMAX) 0.4 MG CAPS capsule TAKE (1) CAPSULE DAILY. 90 capsule 3   traZODone (DESYREL) 50 MG tablet TAKE 1 TABLET AT BEDTIME AS NEEDED FOR SLEEP. MAY REPEAT ONCE IF STILL AWAKE. 100 tablet 3   No current facility-administered medications for this visit.    REVIEW OF SYSTEMS:   Constitutional: ( - ) fevers, ( - )  chills , ( - ) night sweats Eyes: ( - ) blurriness of vision, ( - ) double vision, ( - ) watery eyes Ears, nose, mouth, throat, and face: ( - ) mucositis, ( - ) sore throat Respiratory: ( - ) cough, ( - ) dyspnea, ( - ) wheezes Cardiovascular: ( - ) palpitation, ( - ) chest discomfort, ( - ) lower extremity swelling Gastrointestinal:  ( - ) nausea, ( - ) heartburn, ( - ) change in bowel habits Skin: ( - ) abnormal skin rashes Lymphatics: ( - ) new lymphadenopathy, ( - ) easy bruising Neurological: ( - ) numbness, ( - ) tingling, ( - )  new weaknesses Behavioral/Psych: ( - ) mood change, ( - ) new changes  All other systems were reviewed with the patient and are negative.  PHYSICAL EXAMINATION:  Vitals:   07/10/21 1434  BP: 122/64  Pulse: 63  Resp: 18  Temp: 98 F (36.7 C)  SpO2: 97%    Filed Weights   07/10/21 1434  Weight: 202 lb (91.6 kg)     GENERAL: Well-appearing elderly Caucasian male, alert, no distress and comfortable SKIN: skin color, texture, turgor are normal,  no rashes or significant lesions EYES: conjunctiva are pink and non-injected, sclera clear LUNGS: clear to auscultation and percussion with normal breathing effort HEART: regular rate & rhythm and no murmurs and no lower extremity edema Musculoskeletal: no cyanosis of digits and no clubbing  PSYCH: alert & oriented x 3, fluent speech NEURO: no focal motor/sensory deficits  LABORATORY DATA:  I have reviewed the data as listed    Latest Ref Rng & Units 07/10/2021    2:17 PM 04/10/2021    2:15 PM 01/23/2021    3:09 PM  CBC  WBC 4.0 - 10.5 K/uL 4.7   4.2   5.0    Hemoglobin 13.0 - 17.0 g/dL 16.7   16.3   17.5    Hematocrit 39.0 - 52.0 % 47.9   48.1   51.1    Platelets 150 - 400 K/uL 179   173   188         Latest Ref Rng & Units 07/10/2021    2:17 PM 04/10/2021    2:15 PM 01/23/2021    3:09 PM  CMP  Glucose 70 - 99 mg/dL 123   96   95    BUN 8 - 23 mg/dL '21   20   16    '$ Creatinine 0.61 - 1.24 mg/dL 1.11   1.36   1.26    Sodium 135 - 145 mmol/L 139   142   141    Potassium 3.5 - 5.1 mmol/L 4.1   4.7   4.3    Chloride 98 - 111 mmol/L 105   108   105    CO2 22 - 32 mmol/L '28   28   26    '$ Calcium 8.9 - 10.3 mg/dL 9.7   9.7   9.6    Total Protein 6.5 - 8.1 g/dL 7.2   6.8   8.0    Total Bilirubin 0.3 - 1.2 mg/dL 1.2   0.9   1.3    Alkaline Phos 38 - 126 U/L 54   59   72    AST 15 - 41 U/L '22   20   23    '$ ALT 0 - 44 U/L '20   17   18      '$ RADIOGRAPHIC STUDIES: No results found.  ASSESSMENT & PLAN Ryan [name] 81 y.o. male with  medical history significant for recurrent lower extremity DVT presents for a follow up visit.  Due to recurrent DVTs that were clearly unprovoked, the recommendation is indefinite anticoagulation. Since patient failed Xarelto with recent DVT of the right lower extremity, we recommended to switch to Eliquis.    Patient underwent a hypercoagulable workup to rule out any clotting disorders or genetic mutations that predispose him to venous thromboembolisms.  The results of these tests showed no evidence of a hypercoagulable disorder.   #Recurrent DVTs, bilateral --Felt to be unprovoked without any definitive risk factors so recommend indefinite anticoagulation.  --Patient failed Xarelto with recent DVT in December 2022.  --Patient has allergy to Warfarin with reported rash.  --Recommend continuation of Eliquis 5 mg twice daily. --Labs today to check CBC, CMP, -- hypercoagulation workup with lupus anticoagulant, beta-2 glycoprotein antibodies, cardiolipin antibodies, protein C level and activity, protein S level and activity showed no abnormalities.  -- Discussed maintenance dosing but patient and his daughter would like to continue on full-strength Eliquis. --Labs today are adequate for continued treatment with DOAC: Creatinine 1.11, AST 22, ALT 20, white blood cell count  4.7, hemoglobin 16.7, and platelets of 179. --RTC in 6 months with labs.    No orders of the defined types were placed in this encounter.   All questions were answered. The patient knows to call the clinic with any problems, questions or concerns.  A total of more than 30 minutes were spent on this encounter with face-to-face time and non-face-to-face time, including preparing to see the patient, ordering tests and/or medications, counseling the patient and coordination of care as outlined above.   Ledell Peoples, MD Department of Hematology/Oncology [place] at [facility] Phone:  [phone] Pager: [phone] Email: [email or web address]  07/15/2021 3:11 PM

## 2021-08-06 DIAGNOSIS — R0989 Other specified symptoms and signs involving the circulatory and respiratory systems: Secondary | ICD-10-CM | POA: Diagnosis not present

## 2021-08-06 DIAGNOSIS — M7592 Shoulder lesion, unspecified, left shoulder: Secondary | ICD-10-CM | POA: Diagnosis not present

## 2021-08-06 DIAGNOSIS — M25512 Pain in left shoulder: Secondary | ICD-10-CM | POA: Diagnosis not present

## 2021-08-06 DIAGNOSIS — R059 Cough, unspecified: Secondary | ICD-10-CM | POA: Diagnosis not present

## 2021-08-07 DIAGNOSIS — M19012 Primary osteoarthritis, left shoulder: Secondary | ICD-10-CM | POA: Diagnosis not present

## 2021-08-07 DIAGNOSIS — M47812 Spondylosis without myelopathy or radiculopathy, cervical region: Secondary | ICD-10-CM | POA: Diagnosis not present

## 2021-08-20 ENCOUNTER — Other Ambulatory Visit: Payer: Self-pay | Admitting: Student

## 2021-08-21 ENCOUNTER — Other Ambulatory Visit: Payer: Self-pay | Admitting: Student

## 2021-08-21 DIAGNOSIS — M542 Cervicalgia: Secondary | ICD-10-CM

## 2021-08-25 ENCOUNTER — Other Ambulatory Visit: Payer: Self-pay | Admitting: Student

## 2021-08-25 DIAGNOSIS — M542 Cervicalgia: Secondary | ICD-10-CM

## 2021-09-29 ENCOUNTER — Other Ambulatory Visit: Payer: Self-pay | Admitting: Student

## 2021-09-29 ENCOUNTER — Ambulatory Visit
Admission: RE | Admit: 2021-09-29 | Discharge: 2021-09-29 | Disposition: A | Discharge status: Home / Self Care | Payer: Medicare Other | Source: Ambulatory Visit | Attending: Student | Admitting: Student

## 2021-09-29 DIAGNOSIS — M542 Cervicalgia: Secondary | ICD-10-CM

## 2021-09-29 DIAGNOSIS — M4802 Spinal stenosis, cervical region: Secondary | ICD-10-CM | POA: Diagnosis not present

## 2021-09-29 DIAGNOSIS — M25511 Pain in right shoulder: Secondary | ICD-10-CM | POA: Diagnosis not present

## 2021-09-29 DIAGNOSIS — M50221 Other cervical disc displacement at C4-C5 level: Secondary | ICD-10-CM | POA: Diagnosis not present

## 2021-09-29 DIAGNOSIS — M25512 Pain in left shoulder: Secondary | ICD-10-CM | POA: Diagnosis not present

## 2021-10-28 ENCOUNTER — Other Ambulatory Visit: Payer: Self-pay | Admitting: Physician Assistant

## 2022-01-09 ENCOUNTER — Inpatient Hospital Stay: Payer: Medicare Other | Admitting: Hematology and Oncology

## 2022-01-09 ENCOUNTER — Inpatient Hospital Stay: Payer: Medicare Other | Attending: Hematology and Oncology

## 2022-01-09 ENCOUNTER — Other Ambulatory Visit: Payer: Self-pay

## 2022-01-09 ENCOUNTER — Other Ambulatory Visit: Payer: Self-pay | Admitting: Hematology and Oncology

## 2022-01-09 VITALS — BP 163/93 | HR 51 | Temp 97.9°F | Resp 16 | Wt 208.3 lb

## 2022-01-09 DIAGNOSIS — Z808 Family history of malignant neoplasm of other organs or systems: Secondary | ICD-10-CM | POA: Insufficient documentation

## 2022-01-09 DIAGNOSIS — I82409 Acute embolism and thrombosis of unspecified deep veins of unspecified lower extremity: Secondary | ICD-10-CM

## 2022-01-09 DIAGNOSIS — Z86718 Personal history of other venous thrombosis and embolism: Secondary | ICD-10-CM

## 2022-01-09 DIAGNOSIS — Z803 Family history of malignant neoplasm of breast: Secondary | ICD-10-CM | POA: Insufficient documentation

## 2022-01-09 DIAGNOSIS — Z87891 Personal history of nicotine dependence: Secondary | ICD-10-CM | POA: Insufficient documentation

## 2022-01-09 DIAGNOSIS — Z7901 Long term (current) use of anticoagulants: Secondary | ICD-10-CM | POA: Diagnosis not present

## 2022-01-09 LAB — CMP (CANCER CENTER ONLY)
ALT: 18 U/L (ref 0–44)
AST: 22 U/L (ref 15–41)
Albumin: 4.4 g/dL (ref 3.5–5.0)
Alkaline Phosphatase: 57 U/L (ref 38–126)
Anion gap: 7 (ref 5–15)
BUN: 17 mg/dL (ref 8–23)
CO2: 27 mmol/L (ref 22–32)
Calcium: 9.6 mg/dL (ref 8.9–10.3)
Chloride: 105 mmol/L (ref 98–111)
Creatinine: 1.29 mg/dL — ABNORMAL HIGH (ref 0.61–1.24)
GFR, Estimated: 55 mL/min — ABNORMAL LOW (ref >60)
Glucose, Bld: 115 mg/dL — ABNORMAL HIGH (ref 70–99)
Potassium: 4.3 mmol/L (ref 3.5–5.1)
Sodium: 139 mmol/L (ref 135–145)
Total Bilirubin: 1.2 mg/dL (ref 0.3–1.2)
Total Protein: 7.4 g/dL (ref 6.5–8.1)

## 2022-01-09 LAB — CBC WITH DIFFERENTIAL (CANCER CENTER ONLY)
Abs Immature Granulocytes: 0.01 10*3/uL (ref 0.00–0.07)
Basophils Absolute: 0 10*3/uL (ref 0.0–0.1)
Basophils Relative: 1 %
Eosinophils Absolute: 0.1 10*3/uL (ref 0.0–0.5)
Eosinophils Relative: 1 %
HCT: 48 % (ref 39.0–52.0)
Hemoglobin: 16.6 g/dL (ref 13.0–17.0)
Immature Granulocytes: 0 %
Lymphocytes Relative: 32 %
Lymphs Abs: 1.4 10*3/uL (ref 0.7–4.0)
MCH: 32.9 pg (ref 26.0–34.0)
MCHC: 34.6 g/dL (ref 30.0–36.0)
MCV: 95.2 fL (ref 80.0–100.0)
Monocytes Absolute: 0.5 10*3/uL (ref 0.1–1.0)
Monocytes Relative: 12 %
Neutro Abs: 2.3 10*3/uL (ref 1.7–7.7)
Neutrophils Relative %: 54 %
Platelet Count: 177 10*3/uL (ref 150–400)
RBC: 5.04 MIL/uL (ref 4.22–5.81)
RDW: 13.1 % (ref 11.5–15.5)
WBC Count: 4.3 10*3/uL (ref 4.0–10.5)
nRBC: 0 % (ref 0.0–0.2)

## 2022-01-09 NOTE — Progress Notes (Unsigned)
[place] [phone]   Fax:([phone]  PROGRESS NOTE  Patient Care Team: Garwin Brothers, MD as PCP - General (Internal Medicine) Stefanie Libel, MD (Family Medicine)  Hematological/Oncological History # Recurrent Lower Extremity DVT August 2006: originally diagnosed with a DVT from the level of the mid thigh (left superficial femoral vein, a part of the deep venous system) to the level of the trifurcation. Completed short course of anticoagulation July 2015 : he presented with pain and swelling of the right leg. Doppler US revealed  acute DVT involving the right lower extremity and chronic DVT involving the popliteal vein of the left lower extremity. Started on indefinite Xarelto 01/11/2021: underwent doppler ultrasound of the right lower extremity.  Findings revealed nonocclusive thrombus in the right popliteal vein and complete thrombus in the posterior tibial vein 01/23/2021: Establish care with Dede Query.  Transition to Eliquis therapy.  Interval History:  Ryan [name] 81 y.o. male with medical history significant for recurrent lower extremity DVT presents for a follow up visit. The patient's last visit was on 07/10/2021. In the interim since the last visit he has continued on Eliquis therapy without difficulty.  On exam today Ryan [name] is unaccompanied.  He reports he is tolerating his Eliquis therapy quite well without any difficulties.  He continues to take 5 mg twice daily.  He is not having any trouble with bleeding, bruising, or dark stools.  He reports that he is not having signs or symptoms concerning for a recurrent blood clot such as leg swelling, leg pain, shortness of breath, or chest pain.  He notes he is breathing well sleeping well, and his energy is quite strong.  He has been playing tennis with little to no difficulty.  He reports that the medication is not of exorbitant cost and he is not sure what he pays but it is negligible.  He reports he  is eating well and has no questions comments or concerns today.  He has gained weight up 10 pounds from his last visit though he reports that this is due to the holidays.  He denies any fevers, chills, sweats, nausea, vomiting or diarrhea.  A full 10 point ROS is listed below.  MEDICAL HISTORY:  Past Medical History:  Diagnosis Date   Arthritis    BPH (benign prostatic hyperplasia)    DVT (deep venous thrombosis) (HCC)    x2 "Xarelto use"   Hyperlipidemia     SURGICAL HISTORY: Past Surgical History:  Procedure Laterality Date   APPENDECTOMY     CATARACT EXTRACTION Right    COLONOSCOPY W/ POLYPECTOMY     multiple   COLONOSCOPY WITH PROPOFOL N/A 10/30/2014   Procedure: COLONOSCOPY WITH PROPOFOL;  Surgeon: Garlan Fair, MD;  Location: [facility];  Service: Endoscopy;  Laterality: N/A;   EYE SURGERY     retinal repair bilateral   HERNIA REPAIR     BIH repair child   KNEE ARTHROSCOPY Left    SEPTOPLASTY     TONSILLECTOMY      SOCIAL HISTORY: Social History   Socioeconomic History   Marital status: Married    Spouse name: [name]   Number of children: 6   Years of education: 16   Highest education level: Not on file  Occupational History   Occupation: retired-CFO  Tobacco Use   Smoking status: Former    Types: Cigarettes    Quit date: 02/10/1980    Years since quitting: 41.9   Smokeless tobacco: Never  Substance  and Sexual Activity   Alcohol use: Yes    Alcohol/week: 14.0 standard drinks of alcohol    Types: 14 Standard drinks or equivalent per week    Comment:  daily   Drug use: No   Sexual activity: Not on file  Other Topics Concern   Not on file  Social History Narrative   Health Care POA:    Emergency Contact: wife, [name] [phone]   End of Life Plan: Pt reports having a end of life plan and will bring copy for next visit   Who lives with you: wife, [name]   Any pets: none   Diet: Pt has a varied diet of protein, starch, vegetables   Exercise: Pt  reports playing tennis and completing yard work at least 5 days per week   Seatbelts: Pt reports wearing seatbelt when in vehicles.    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  MEDICATIONS:  Current Outpatient Medications  Medication Sig Dispense Refill   acetaminophen (TYLENOL) 325 MG tablet Take 650 mg by mouth every 6 (six) hours as needed for mild pain.     donepezil (ARICEPT) 10 MG tablet TAKE ONE TABLET AT BEDTIME. 90 tablet 0   ELIQUIS 5 MG TABS tablet TAKE 1 TABLET BY MOUTH TWICE DAILY 60 tablet 10   lovastatin (MEVACOR) 20 MG tablet TAKE ONE TABLET AT BEDTIME. 90 tablet 3   memantine (NAMENDA) 5 MG tablet TAKE 1 TABLET BY MOUTH TWICE DAILY. 180 tablet 3   tamsulosin (FLOMAX) 0.4 MG CAPS capsule TAKE (1) CAPSULE DAILY. 90 capsule 3   traZODone (DESYREL) 50 MG tablet TAKE 1 TABLET AT BEDTIME AS NEEDED FOR SLEEP. MAY REPEAT ONCE IF STILL AWAKE. 100 tablet 3   No current facility-administered medications for this visit.    REVIEW OF SYSTEMS:   Constitutional: ( - ) fevers, ( - )  chills , ( - ) night sweats Eyes: ( - ) blurriness of vision, ( - ) double vision, ( - ) watery eyes Ears, nose, mouth, throat, and face: ( - ) mucositis, ( - ) sore throat Respiratory: ( - ) cough, ( - ) dyspnea, ( - ) wheezes Cardiovascular: ( - ) palpitation,  ( - ) chest discomfort, ( - ) lower extremity swelling Gastrointestinal:  ( - ) nausea, ( - ) heartburn, ( - ) change in bowel habits Skin: ( - ) abnormal skin rashes Lymphatics: ( - ) new lymphadenopathy, ( - ) easy bruising Neurological: ( - ) numbness, ( - ) tingling, ( - ) new weaknesses Behavioral/Psych: ( - ) mood change, ( - ) new changes  All other systems were reviewed with the patient and are negative.  PHYSICAL EXAMINATION:  Vitals:  01/09/22 1406  BP: (!) 163/93  Pulse: (!) 51  Resp: 16  Temp: 97.9 F (36.6 C)  SpO2: 96%    Filed Weights   01/09/22 1406  Weight: 208 lb 4.8 oz (94.5 kg)     GENERAL: Well-appearing elderly Caucasian male, alert, no distress and comfortable SKIN: skin color, texture, turgor are normal, no rashes or significant lesions EYES: conjunctiva are pink and non-injected, sclera clear LUNGS: clear to auscultation and percussion with normal breathing effort HEART: regular rate & rhythm and no murmurs and no lower extremity edema Musculoskeletal: no cyanosis of digits and no clubbing  PSYCH: alert & oriented x 3, fluent speech NEURO: no focal motor/sensory deficits  LABORATORY DATA:  I have reviewed the data as listed    Latest Ref Rng & Units 01/09/2022    1:48 PM 07/10/2021    2:17 PM 04/10/2021    2:15 PM  CBC  WBC 4.0 - 10.5 K/uL 4.3  4.7  4.2   Hemoglobin 13.0 - 17.0 g/dL 16.6  16.7  16.3   Hematocrit 39.0 - 52.0 % 48.0  47.9  48.1   Platelets 150 - 400 K/uL 177  179  173        Latest Ref Rng & Units 01/09/2022    1:48 PM 07/10/2021    2:17 PM 04/10/2021    2:15 PM  CMP  Glucose 70 - 99 mg/dL 115  123  96   BUN 8 - 23 mg/dL '17  21  20   '$ Creatinine 0.61 - 1.24 mg/dL 1.29  1.11  1.36   Sodium 135 - 145 mmol/L 139  139  142   Potassium 3.5 - 5.1 mmol/L 4.3  4.1  4.7   Chloride 98 - 111 mmol/L 105  105  108   CO2 22 - 32 mmol/L '27  28  28   '$ Calcium 8.9 - 10.3 mg/dL 9.6  9.7  9.7   Total Protein 6.5 - 8.1 g/dL 7.4  7.2  6.8    Total Bilirubin 0.3 - 1.2 mg/dL 1.2  1.2  0.9   Alkaline Phos 38 - 126 U/L 57  54  59   AST 15 - 41 U/L '22  22  20   '$ ALT 0 - 44 U/L '18  20  17     '$ RADIOGRAPHIC STUDIES: No results found.  ASSESSMENT & PLAN Ryan [name] 81 y.o. male with medical history significant for recurrent lower extremity DVT presents for a follow up visit.  Due to recurrent DVTs that were clearly unprovoked, the recommendation is indefinite anticoagulation. Since patient failed Xarelto with recent DVT of the right lower extremity, we recommended to switch to Eliquis.    Patient underwent a hypercoagulable workup to rule out any clotting disorders or genetic mutations that predispose him to venous thromboembolisms.  The results of these tests showed no evidence of a hypercoagulable disorder.   #Recurrent DVTs, bilateral --Felt to be unprovoked without any definitive risk factors so recommend indefinite anticoagulation.  --Patient failed Xarelto with recent DVT in December 2022.  --Patient has allergy to Warfarin with reported rash.  --Recommend continuation of Eliquis 5 mg twice daily. --Labs today to check CBC, CMP -- hypercoagulation workup with lupus anticoagulant, beta-2 glycoprotein antibodies, cardiolipin antibodies, protein C level and activity, protein S level and activity showed no abnormalities.  -- previously discussed maintenance dosing but patient and his daughter would like to continue on full-strength Eliquis. --Labs today are adequate for continued treatment with DOAC:  White blood cell 4.3, hemoglobin 16.6, MCV 95.2, and platelets 177 --RTC in 6 months with labs.    No orders of the defined types were placed in this encounter.   All questions were answered. The patient knows to call the clinic with any problems, questions or concerns.  A total of more than 25 minutes were spent on this encounter with face-to-face time and non-face-to-face time, including preparing to see the patient, ordering  tests and/or medications, counseling the patient and coordination of care as outlined above.   Ledell Peoples, MD Department of Hematology/Oncology [place] [facility] Phone: [phone] Pager: [phone] Email: [email or web address]  01/11/2022 7:13 PM

## 2022-03-30 ENCOUNTER — Other Ambulatory Visit: Payer: Self-pay | Admitting: Internal Medicine

## 2022-03-30 DIAGNOSIS — F0390 Unspecified dementia without behavioral disturbance: Secondary | ICD-10-CM

## 2022-06-15 ENCOUNTER — Other Ambulatory Visit: Payer: Self-pay | Admitting: Internal Medicine

## 2022-06-15 DIAGNOSIS — E7800 Pure hypercholesterolemia, unspecified: Secondary | ICD-10-CM

## 2022-07-15 ENCOUNTER — Inpatient Hospital Stay: Payer: Medicare Other | Admitting: Hematology and Oncology

## 2022-07-15 ENCOUNTER — Other Ambulatory Visit: Payer: Self-pay | Admitting: Hematology and Oncology

## 2022-07-15 ENCOUNTER — Inpatient Hospital Stay: Payer: Medicare Other

## 2022-07-15 DIAGNOSIS — I82409 Acute embolism and thrombosis of unspecified deep veins of unspecified lower extremity: Secondary | ICD-10-CM

## 2022-07-15 NOTE — Progress Notes (Unsigned)
[facility] [phone]) [phone]   Fax:(336) [phone]  PROGRESS NOTE  Patient Care Team: [name], MD as PCP - General (Internal Medicine) [name], MD (Family Medicine)  Hematological/Oncological History # Recurrent Lower Extremity DVT August 2006: originally diagnosed with a DVT from the level of the mid thigh (left superficial femoral vein, a part of the deep venous system) to the level of the trifurcation. Completed short course of anticoagulation July 2015 : he presented with pain and swelling of the right leg. Doppler US revealed  acute DVT involving the right lower extremity and chronic DVT involving the popliteal vein of the left lower extremity. Started on indefinite Xarelto 01/11/2021: underwent doppler ultrasound of the right lower extremity.  Findings revealed nonocclusive thrombus in the right popliteal vein and complete thrombus in the posterior tibial vein 01/23/2021: Establish care with Ryan [name].  Transition to Eliquis therapy.  Interval History:  Ryan [name] 82 y.o. male with medical history significant for recurrent lower extremity DVT presents for a follow up visit. The patient's last visit was on 01/09/2022. In the interim since the last visit he has continued on Eliquis therapy without difficulty.  On exam today Ryan [name] I***  He denies any fevers, chills, sweats, nausea, vomiting or diarrhea.  A full 10 point ROS is listed below.  MEDICAL HISTORY:  Past Medical History:  Diagnosis Date   Arthritis    BPH (benign prostatic hyperplasia)    DVT (deep venous thrombosis) (HCC)    x2 "Xarelto use"   Hyperlipidemia     SURGICAL HISTORY: Past Surgical History:  Procedure Laterality Date   APPENDECTOMY     CATARACT EXTRACTION Right    COLONOSCOPY W/ POLYPECTOMY     multiple   COLONOSCOPY WITH PROPOFOL N/A 10/30/2014   Procedure: COLONOSCOPY WITH PROPOFOL;  Surgeon: Charolett Bumpers, MD;  Location: [facility];  Service: Endoscopy;   Laterality: N/A;   EYE SURGERY     retinal repair bilateral   HERNIA REPAIR     BIH repair child   KNEE ARTHROSCOPY Left    SEPTOPLASTY     TONSILLECTOMY      SOCIAL HISTORY: Social History   Socioeconomic History   Marital status: Married    Spouse name: Ryan [name]   Number of children: 6   Years of education: 16   Highest education level: Not on file  Occupational History   Occupation: retired-CFO  Tobacco Use   Smoking status: Former    Types: Cigarettes    Quit date: 02/10/1980    Years since quitting: 42.4   Smokeless tobacco: Never  Substance and Sexual Activity   Alcohol use: Yes    Alcohol/week: 14.0 standard drinks of alcohol    Types: 14 Standard drinks or equivalent per week    Comment:  daily   Drug use: No   Sexual activity: Not on file  Other Topics Concern   Not on file  Social History Narrative   Health Care POA:    Emergency Contact: wife, Ryan [name] [phone]   End of Life Plan: Pt reports having a end of life plan and will bring copy for next visit   Who lives with you: wife, Ryan [name]   Any pets: none   Diet: Pt has a varied diet of protein, starch, vegetables   Exercise: Pt reports playing tennis and completing yard work at least 5 days per week   Seatbelts: Pt reports wearing seatbelt when in vehicles.    Wynelle Link Exposure/Protection: Pt reports  wearing ball cap but no sun block   Hobbies: tennis, biking, volunteering, yard work    International aid/development worker of Corporate investment banker Strain: Not on file  Food Insecurity: Not on file  Transportation Needs: Not on file  Physical Activity: Not on file  Stress: Not on file  Social Connections: Not on file  Intimate Partner Violence: Not on file    FAMILY HISTORY: Family History  Problem Relation Age of Onset   Alzheimer's disease Mother    Arthritis Mother    Arthritis Father    Cancer Sister        breast   Cancer Brother        melanoma   Stroke Paternal Grandfather     ALLERGIES:  is allergic  to simvastatin and warfarin sodium.  MEDICATIONS:  Current Outpatient Medications  Medication Sig Dispense Refill   acetaminophen (TYLENOL) 325 MG tablet Take 650 mg by mouth every 6 (six) hours as needed for mild pain.     donepezil (ARICEPT) 10 MG tablet TAKE ONE TABLET AT BEDTIME. 90 tablet 0   ELIQUIS 5 MG TABS tablet TAKE 1 TABLET BY MOUTH TWICE DAILY 60 tablet 10   lovastatin (MEVACOR) 20 MG tablet TAKE 1 TABLET BY MOUTH EVERY NIGHT AT BEDTIME 90 tablet 2   memantine (NAMENDA) 5 MG tablet TAKE 1 TABLET BY MOUTH TWICE DAILY 180 tablet 2   tamsulosin (FLOMAX) 0.4 MG CAPS capsule TAKE (1) CAPSULE DAILY. 90 capsule 3   traZODone (DESYREL) 50 MG tablet TAKE 1 TABLET AT BEDTIME AS NEEDED FOR SLEEP. MAY REPEAT ONCE IF STILL AWAKE. 100 tablet 3   No current facility-administered medications for this visit.    REVIEW OF SYSTEMS:   Constitutional: ( - ) fevers, ( - )  chills , ( - ) night sweats Eyes: ( - ) blurriness of vision, ( - ) double vision, ( - ) watery eyes Ears, nose, mouth, throat, and face: ( - ) mucositis, ( - ) sore throat Respiratory: ( - ) cough, ( - ) dyspnea, ( - ) wheezes Cardiovascular: ( - ) palpitation, ( - ) chest discomfort, ( - ) lower extremity swelling Gastrointestinal:  ( - ) nausea, ( - ) heartburn, ( - ) change in bowel habits Skin: ( - ) abnormal skin rashes Lymphatics: ( - ) new lymphadenopathy, ( - ) easy bruising Neurological: ( - ) numbness, ( - ) tingling, ( - ) new weaknesses Behavioral/Psych: ( - ) mood change, ( - ) new changes  All other systems were reviewed with the patient and are negative.  PHYSICAL EXAMINATION:  There were no vitals filed for this visit. There were no vitals filed for this visit.  GENERAL: Well-appearing elderly Caucasian male, alert, no distress and comfortable SKIN: skin color, texture, turgor are normal, no rashes or significant lesions EYES: conjunctiva are pink and non-injected, sclera clear LUNGS: clear to  auscultation and percussion with normal breathing effort HEART: regular rate & rhythm and no murmurs and no lower extremity edema Musculoskeletal: no cyanosis of digits and no clubbing  PSYCH: alert & oriented x 3, fluent speech NEURO: no focal motor/sensory deficits  LABORATORY DATA:  I have reviewed the data as listed    Latest Ref Rng & Units 01/09/2022    1:48 PM 07/10/2021    2:17 PM 04/10/2021    2:15 PM  CBC  WBC 4.0 - 10.5 K/uL 4.3  4.7  4.2   Hemoglobin 13.0 - 17.0 g/dL  16.6  16.7  16.3   Hematocrit 39.0 - 52.0 % 48.0  47.9  48.1   Platelets 150 - 400 K/uL 177  179  173        Latest Ref Rng & Units 01/09/2022    1:48 PM 07/10/2021    2:17 PM 04/10/2021    2:15 PM  CMP  Glucose 70 - 99 mg/dL 161  096  96   BUN 8 - 23 mg/dL 17  21  20    Creatinine 0.61 - 1.24 mg/dL 0.45  4.09  8.11   Sodium 135 - 145 mmol/L 139  139  142   Potassium 3.5 - 5.1 mmol/L 4.3  4.1  4.7   Chloride 98 - 111 mmol/L 105  105  108   CO2 22 - 32 mmol/L 27  28  28    Calcium 8.9 - 10.3 mg/dL 9.6  9.7  9.7   Total Protein 6.5 - 8.1 g/dL 7.4  7.2  6.8   Total Bilirubin 0.3 - 1.2 mg/dL 1.2  1.2  0.9   Alkaline Phos 38 - 126 U/L 57  54  59   AST 15 - 41 U/L 22  22  20    ALT 0 - 44 U/L 18  20  17      RADIOGRAPHIC STUDIES: No results found.  ASSESSMENT & PLAN Ryan [name] 82 y.o. male with medical history significant for recurrent lower extremity DVT presents for a follow up visit.  Due to recurrent DVTs that were clearly unprovoked, the recommendation is indefinite anticoagulation. Since patient failed Xarelto with recent DVT of the right lower extremity, we recommended to switch to Eliquis.    Patient underwent a hypercoagulable workup to rule out any clotting disorders or genetic mutations that predispose him to venous thromboembolisms.  The results of these tests showed no evidence of a hypercoagulable disorder.   #Recurrent DVTs, bilateral --Felt to be unprovoked without any definitive risk  factors so recommend indefinite anticoagulation.  --Patient failed Xarelto with recent DVT in December 2022.  --Patient has allergy to Warfarin with reported rash.  --Recommend continuation of Eliquis 5 mg twice daily. --Labs today to check CBC, CMP -- hypercoagulation workup with lupus anticoagulant, beta-2 glycoprotein antibodies, cardiolipin antibodies, protein C level and activity, protein S level and activity showed no abnormalities.  -- previously discussed maintenance dosing but patient and his daughter would like to continue on full-strength Eliquis. Continue 5 mg BID. --Labs today are adequate for continued treatment with DOAC: White blood cell *** --RTC in 6 months with labs.    No orders of the defined types were placed in this encounter.   All questions were answered. The patient knows to call the clinic with any problems, questions or concerns.  A total of more than 25 minutes were spent on this encounter with face-to-face time and non-face-to-face time, including preparing to see the patient, ordering tests and/or medications, counseling the patient and coordination of care as outlined above.   Ulysees Barns, MD Department of Hematology/Oncology [facility] Phone: [phone] Pager: [phone] Email: [email or web address]  07/15/2022 7:40 AM

## 2022-07-16 ENCOUNTER — Other Ambulatory Visit: Payer: Self-pay | Admitting: Cardiothoracic Surgery

## 2022-07-16 DIAGNOSIS — I712 Thoracic aortic aneurysm, without rupture, unspecified: Secondary | ICD-10-CM

## 2022-07-16 NOTE — Progress Notes (Signed)
[facility] Telephone:(336) [phone]   [phone]  PROGRESS NOTE  Patient Care Team: [name], MD as PCP - General (Internal Medicine) [name], MD (Family Medicine)  Hematological/Oncological History # Recurrent Lower Extremity DVT August 2006: originally diagnosed with a DVT from the level of the mid thigh (left superficial femoral vein, a part of the deep venous system) to the level of the trifurcation. Completed short course of anticoagulation July 2015 : he presented with pain and swelling of the right leg. Doppler US revealed  acute DVT involving the right lower extremity and chronic DVT involving the popliteal vein of the left lower extremity. Started on indefinite Xarelto 01/11/2021: underwent doppler ultrasound of the right lower extremity.  Findings revealed nonocclusive thrombus in the right popliteal vein and complete thrombus in the posterior tibial vein 01/23/2021: Establish care with Ryan [name].  Transition to Eliquis therapy.  Interval History:  Ryan [name] 82 y.o. male with medical history significant for recurrent lower extremity DVT presents for a follow up visit. The patient's last visit was on 01/09/2022. In the interim since the last visit he has continued on Eliquis therapy without difficulty.  On exam today Ryan [name] reports that he has had no changes in his health in the interim since her last visit.  He did celebrate his 82th birthday recently.  He notes that he is tolerating Eliquis well and taking it twice daily as prescribed.  He has had no missed dosages.  He reports no bleeding, bruising, or dark stools.  He reports the cost of the medication is "not an issue".  He has no signs or symptoms concerning for new blood clot.  He notes that he has no upcoming procedures such as dental work or surgeries.  Overall his health is steady.  His appetite is good and his energy levels are strong.  He has had no recent illnesses. He is at his  baseline level of health. he denies any fevers, chills, sweats, nausea, vomiting or diarrhea.  A full 10 point ROS is listed below.  MEDICAL HISTORY:  Past Medical History:  Diagnosis Date   Arthritis    BPH (benign prostatic hyperplasia)    DVT (deep venous thrombosis) (HCC)    x2 "Xarelto use"   Hyperlipidemia     SURGICAL HISTORY: Past Surgical History:  Procedure Laterality Date   APPENDECTOMY     CATARACT EXTRACTION Right    COLONOSCOPY W/ POLYPECTOMY     multiple   COLONOSCOPY WITH PROPOFOL N/A 10/30/2014   Procedure: COLONOSCOPY WITH PROPOFOL;  Surgeon: Charolett Bumpers, MD;  Location: [facility];  Service: Endoscopy;  Laterality: N/A;   EYE SURGERY     retinal repair bilateral   HERNIA REPAIR     BIH repair child   KNEE ARTHROSCOPY Left    SEPTOPLASTY     TONSILLECTOMY      SOCIAL HISTORY: Social History   Socioeconomic History   Marital status: Married    Spouse name: Lupita Leash   Number of children: 6   Years of education: 16   Highest education level: Not on file  Occupational History   Occupation: retired-CFO  Tobacco Use   Smoking status: Former    Types: Cigarettes    Quit date: 02/10/1980    Years since quitting: 42.4   Smokeless tobacco: Never  Substance and Sexual Activity   Alcohol use: Yes    Alcohol/week: 14.0 standard drinks of alcohol    Types: 14 Standard drinks or equivalent  per week    Comment:  daily   Drug use: No   Sexual activity: Not on file  Other Topics Concern   Not on file  Social History Narrative   Health Care POA:    Emergency Contact: wife, Lupita Leash [phone]   End of Life Plan: Pt reports having a end of life plan and will bring copy for next visit   Who lives with you: wife, Lupita Leash   Any pets: none   Diet: Pt has a varied diet of protein, starch, vegetables   Exercise: Pt reports playing tennis and completing yard work at least 5 days per week   Seatbelts: Pt reports wearing seatbelt when in vehicles.    Sun  Exposure/Protection: Pt reports wearing ball cap but no sun block   Hobbies: tennis, biking, volunteering, yard work    International aid/development worker of Corporate investment banker Strain: Not on [employer] Insecurity: Not on file  Transportation Needs: Not on file  Physical Activity: Not on file  Stress: Not on file  Social Connections: Not on file  Intimate Partner Violence: Not on file    FAMILY HISTORY: Family History  Problem Relation Age of Onset   Alzheimer's disease Mother    Arthritis Mother    Arthritis Father    Cancer Sister        breast   Cancer Brother        melanoma   Stroke Paternal Grandfather     ALLERGIES:  is allergic to simvastatin and warfarin sodium.  MEDICATIONS:  Current Outpatient Medications  Medication Sig Dispense Refill   acetaminophen (TYLENOL) 325 MG tablet Take 650 mg by mouth every 6 (six) hours as needed for mild pain.     donepezil (ARICEPT) 10 MG tablet TAKE ONE TABLET AT BEDTIME. 90 tablet 0   ELIQUIS 5 MG TABS tablet TAKE 1 TABLET BY MOUTH TWICE DAILY 60 tablet 10   lovastatin (MEVACOR) 20 MG tablet TAKE 1 TABLET BY MOUTH EVERY NIGHT AT BEDTIME 90 tablet 2   memantine (NAMENDA) 5 MG tablet TAKE 1 TABLET BY MOUTH TWICE DAILY 180 tablet 2   tamsulosin (FLOMAX) 0.4 MG CAPS capsule TAKE (1) CAPSULE DAILY. 90 capsule 3   traZODone (DESYREL) 50 MG tablet TAKE 1 TABLET AT BEDTIME AS NEEDED FOR SLEEP. MAY REPEAT ONCE IF STILL AWAKE. 100 tablet 3   No current facility-administered medications for this visit.    REVIEW OF SYSTEMS:   Constitutional: ( - ) fevers, ( - )  chills , ( - ) night sweats Eyes: ( - ) blurriness of vision, ( - ) double vision, ( - ) watery eyes Ears, nose, mouth, throat, and face: ( - ) mucositis, ( - ) sore throat Respiratory: ( - ) cough, ( - ) dyspnea, ( - ) wheezes Cardiovascular: ( - ) palpitation, ( - ) chest discomfort, ( - ) lower extremity swelling Gastrointestinal:  ( - ) nausea, ( - ) heartburn, ( - ) change in  bowel habits Skin: ( - ) abnormal skin rashes Lymphatics: ( - ) new lymphadenopathy, ( - ) easy bruising Neurological: ( - ) numbness, ( - ) tingling, ( - ) new weaknesses Behavioral/Psych: ( - ) mood change, ( - ) new changes  All other systems were reviewed with the patient and are negative.  PHYSICAL EXAMINATION:  Vitals:   07/17/22 0812  BP: 131/78  Pulse: (!) 51  Resp: 16  Temp: (!) 97.3 F (36.3 C)  SpO2: 94%   Filed Weights   07/17/22 0812  Weight: 200 lb 14.4 oz (91.1 kg)    GENERAL: Well-appearing elderly Caucasian male, alert, no distress and comfortable SKIN: skin color, texture, turgor are normal, no rashes or significant lesions EYES: conjunctiva are pink and non-injected, sclera clear LUNGS: clear to auscultation and percussion with normal breathing effort HEART: regular rate & rhythm and no murmurs and no lower extremity edema Musculoskeletal: no cyanosis of digits and no clubbing  PSYCH: alert & oriented x 3, fluent speech NEURO: no focal motor/sensory deficits  LABORATORY DATA:  I have reviewed the data as listed    Latest Ref Rng & Units 07/17/2022    7:44 AM 01/09/2022    1:48 PM 07/10/2021    2:17 PM  CBC  WBC 4.0 - 10.5 K/uL 4.4  4.3  4.7   Hemoglobin 13.0 - 17.0 g/dL 16.1  09.6  04.5   Hematocrit 39.0 - 52.0 % 48.6  48.0  47.9   Platelets 150 - 400 K/uL 161  177  179        Latest Ref Rng & Units 01/09/2022    1:48 PM 07/10/2021    2:17 PM 04/10/2021    2:15 PM  CMP  Glucose 70 - 99 mg/dL 409  811  96   BUN 8 - 23 mg/dL 17  21  20    Creatinine 0.61 - 1.24 mg/dL 9.14  7.82  9.56   Sodium 135 - 145 mmol/L 139  139  142   Potassium 3.5 - 5.1 mmol/L 4.3  4.1  4.7   Chloride 98 - 111 mmol/L 105  105  108   CO2 22 - 32 mmol/L 27  28  28    Calcium 8.9 - 10.3 mg/dL 9.6  9.7  9.7   Total Protein 6.5 - 8.1 g/dL 7.4  7.2  6.8   Total Bilirubin 0.3 - 1.2 mg/dL 1.2  1.2  0.9   Alkaline Phos 38 - 126 U/L 57  54  59   AST 15 - 41 U/L 22  22  20    ALT 0 -  44 U/L 18  20  17      RADIOGRAPHIC STUDIES: No results found.  ASSESSMENT & PLAN RENDER MATHEUS 82 y.o. male with medical history significant for recurrent lower extremity DVT presents for a follow up visit.  Due to recurrent DVTs that were clearly unprovoked, the recommendation is indefinite anticoagulation. Since patient failed Xarelto with recent DVT of the right lower extremity, we recommended to switch to Eliquis.    Patient underwent a hypercoagulable workup to rule out any clotting disorders or genetic mutations that predispose him to venous thromboembolisms.  The results of these tests showed no evidence of a hypercoagulable disorder.   #Recurrent DVTs, bilateral --Felt to be unprovoked without any definitive risk factors so recommend indefinite anticoagulation.  --Patient failed Xarelto with recent DVT in December 2022.  --Patient has allergy to Warfarin with reported rash.  --Recommend continuation of Eliquis 5 mg twice daily. --Labs today to check CBC, CMP -- hypercoagulation workup with lupus anticoagulant, beta-2 glycoprotein antibodies, cardiolipin antibodies, protein C level and activity, protein S level and activity showed no abnormalities.  -- previously discussed maintenance dosing but patient and his daughter would like to continue on full-strength Eliquis. Continue 5 mg BID. --Labs today are adequate for continued treatment with DOAC: White blood cell 4.4, Hgb 17.2, MCV 94.6, Plt 161 --RTC in 6 months with labs.  No orders of the defined types were placed in this encounter.   All questions were answered. The patient knows to call the clinic with any problems, questions or concerns.  A total of more than 25 minutes were spent on this encounter with face-to-face time and non-face-to-face time, including preparing to see the patient, ordering tests and/or medications, counseling the patient and coordination of care as outlined above.   Ulysees Barns, MD Department  of Hematology/Oncology [facility] at [facility] Phone: [phone] Pager: [phone] Email: [email or web address]  07/17/2022 8:36 AM

## 2022-07-17 ENCOUNTER — Other Ambulatory Visit: Payer: Self-pay

## 2022-07-17 ENCOUNTER — Inpatient Hospital Stay: Payer: Medicare Other | Attending: Hematology and Oncology

## 2022-07-17 ENCOUNTER — Inpatient Hospital Stay (HOSPITAL_BASED_OUTPATIENT_CLINIC_OR_DEPARTMENT_OTHER): Payer: Medicare Other | Admitting: Hematology and Oncology

## 2022-07-17 VITALS — BP 131/78 | HR 51 | Temp 97.3°F | Resp 16 | Wt 200.9 lb

## 2022-07-17 DIAGNOSIS — Z86718 Personal history of other venous thrombosis and embolism: Secondary | ICD-10-CM | POA: Diagnosis present

## 2022-07-17 DIAGNOSIS — I82409 Acute embolism and thrombosis of unspecified deep veins of unspecified lower extremity: Secondary | ICD-10-CM

## 2022-07-17 DIAGNOSIS — Z808 Family history of malignant neoplasm of other organs or systems: Secondary | ICD-10-CM | POA: Insufficient documentation

## 2022-07-17 DIAGNOSIS — Z7901 Long term (current) use of anticoagulants: Secondary | ICD-10-CM | POA: Diagnosis not present

## 2022-07-17 DIAGNOSIS — Z87891 Personal history of nicotine dependence: Secondary | ICD-10-CM | POA: Insufficient documentation

## 2022-07-17 LAB — CBC WITH DIFFERENTIAL (CANCER CENTER ONLY)
Abs Immature Granulocytes: 0.01 10*3/uL (ref 0.00–0.07)
Basophils Absolute: 0.1 10*3/uL (ref 0.0–0.1)
Basophils Relative: 1 %
Eosinophils Absolute: 0.1 10*3/uL (ref 0.0–0.5)
Eosinophils Relative: 2 %
HCT: 48.6 % (ref 39.0–52.0)
Hemoglobin: 17.2 g/dL — ABNORMAL HIGH (ref 13.0–17.0)
Immature Granulocytes: 0 %
Lymphocytes Relative: 35 %
Lymphs Abs: 1.6 10*3/uL (ref 0.7–4.0)
MCH: 33.5 pg (ref 26.0–34.0)
MCHC: 35.4 g/dL (ref 30.0–36.0)
MCV: 94.6 fL (ref 80.0–100.0)
Monocytes Absolute: 0.6 10*3/uL (ref 0.1–1.0)
Monocytes Relative: 14 %
Neutro Abs: 2.1 10*3/uL (ref 1.7–7.7)
Neutrophils Relative %: 48 %
Platelet Count: 161 10*3/uL (ref 150–400)
RBC: 5.14 MIL/uL (ref 4.22–5.81)
RDW: 12.6 % (ref 11.5–15.5)
WBC Count: 4.4 10*3/uL (ref 4.0–10.5)
nRBC: 0 % (ref 0.0–0.2)

## 2022-07-17 LAB — CMP (CANCER CENTER ONLY)
ALT: 16 U/L (ref 0–44)
AST: 21 U/L (ref 15–41)
Albumin: 4.4 g/dL (ref 3.5–5.0)
Alkaline Phosphatase: 64 U/L (ref 38–126)
Anion gap: 8 (ref 5–15)
BUN: 24 mg/dL — ABNORMAL HIGH (ref 8–23)
CO2: 26 mmol/L (ref 22–32)
Calcium: 9.5 mg/dL (ref 8.9–10.3)
Chloride: 105 mmol/L (ref 98–111)
Creatinine: 1.29 mg/dL — ABNORMAL HIGH (ref 0.61–1.24)
GFR, Estimated: 55 mL/min — ABNORMAL LOW (ref >60)
Glucose, Bld: 116 mg/dL — ABNORMAL HIGH (ref 70–99)
Potassium: 4.1 mmol/L (ref 3.5–5.1)
Sodium: 139 mmol/L (ref 135–145)
Total Bilirubin: 1.1 mg/dL (ref 0.3–1.2)
Total Protein: 7.4 g/dL (ref 6.5–8.1)

## 2022-07-20 ENCOUNTER — Telehealth: Payer: Self-pay | Admitting: Hematology and Oncology

## 2022-08-04 ENCOUNTER — Other Ambulatory Visit: Payer: Self-pay | Admitting: Internal Medicine

## 2022-08-06 ENCOUNTER — Other Ambulatory Visit: Payer: Self-pay | Admitting: Internal Medicine

## 2022-08-06 MED ORDER — DONEPEZIL HCL 10 MG PO TABS: 10.0000 mg | ORAL_TABLET | Freq: Every day | ORAL | 3 refills | Status: DC

## 2022-08-10 ENCOUNTER — Other Ambulatory Visit: Payer: Self-pay | Admitting: Internal Medicine

## 2022-08-17 ENCOUNTER — Encounter: Payer: Self-pay | Admitting: Cardiothoracic Surgery

## 2022-08-17 ENCOUNTER — Ambulatory Visit: Payer: Medicare Other | Admitting: Cardiothoracic Surgery

## 2022-08-17 ENCOUNTER — Ambulatory Visit
Admission: RE | Admit: 2022-08-17 | Discharge: 2022-08-17 | Disposition: A | Discharge status: Home / Self Care | Payer: Medicare Other | Source: Ambulatory Visit | Attending: Cardiothoracic Surgery | Admitting: Cardiothoracic Surgery

## 2022-08-17 VITALS — BP 127/79 | HR 56 | Resp 18 | Ht 76.0 in | Wt 205.0 lb

## 2022-08-17 DIAGNOSIS — I712 Thoracic aortic aneurysm, without rupture, unspecified: Secondary | ICD-10-CM

## 2022-08-17 MED ORDER — IOPAMIDOL (ISOVUE-370) INJECTION 76%
75.0000 mL | Freq: Once | INTRAVENOUS | Status: AC | PRN
  Administered 2022-08-17: 75 mL via INTRAVENOUS

## 2022-08-17 NOTE — Progress Notes (Signed)
HPI: 82 year old healthy active male returns with CTA scan to evaluate a stable asymptomatic 4.5 cm fusiform ascending aneurysm first noted in 2017.  He has no history of hypertension or smoking.  He has no symptoms of chest discomfort or shortness of breath.  I reviewed the CTA images today which show no change in the aortic diameter at 4.5 cm.  There is no intramural thickening or ulceration. Patient remains at low risk for aortic dissection.  He is following a heart healthy lifestyle and monitoring blood pressure is the best therapy going forward.  Will have him return back in about 18 months for follow-up CT scan.  He has had history of left leg DVT with recurrence and is now currently on chronic Eliquis.  No bleeding complications. Current Outpatient Medications  Medication Sig Dispense Refill   acetaminophen (TYLENOL) 325 MG tablet Take 650 mg by mouth every 6 (six) hours as needed for mild pain.     donepezil (ARICEPT) 10 MG tablet TAKE 1 TABLET BY MOUTH ONCE DAILY 90 tablet 2   ELIQUIS 5 MG TABS tablet TAKE 1 TABLET BY MOUTH TWICE DAILY 60 tablet 10   lovastatin (MEVACOR) 20 MG tablet TAKE 1 TABLET BY MOUTH EVERY NIGHT AT BEDTIME 90 tablet 2   memantine (NAMENDA) 5 MG tablet TAKE 1 TABLET BY MOUTH TWICE DAILY 180 tablet 2   tamsulosin (FLOMAX) 0.4 MG CAPS capsule TAKE 1 CAPSULE BY MOUTH ONCE DAILY *DO NOT CRUSH OR CHEW* **DO NOT OPEN CAPSULE** 90 capsule 2   traZODone (DESYREL) 50 MG tablet TAKE 1 TABLET AT BEDTIME AS NEEDED FOR SLEEP. MAY REPEAT ONCE IF STILL AWAKE. 100 tablet 3   No current facility-administered medications for this visit.     Physical Exam: Vitals:   08/17/22 1306  BP: 127/79  Pulse: (!) 56  Resp: 18  SpO2: 91%         Exam    General- alert and comfortable    Neck- no JVD, no cervical adenopathy palpable, no carotid bruit   Lungs- clear without rales, wheezes   Cor- regular rate and rhythm, no murmur , gallop   Abdomen- soft, non-tender   Extremities  - warm, non-tender, minimal edema   Neuro- oriented, appropriate, no focal weakness    Diagnostic Tests: CTA images performed today personally reviewed and discussed with patient. The aortic diameter remains stable at 4.5 cm. Impression: Stable asymptomatic fusiform aneurysm. Patient remains at low risk for dissection. Best therapy going forward is to continue heart healthy lifestyle and monitor blood pressure with surveillance CT scans every 12-18 months.  Plan: Return in about 1 year for repeat CT of thoracic aorta.   Lovett Sox, MD [facility] [phone]

## 2022-09-24 ENCOUNTER — Other Ambulatory Visit: Payer: Self-pay | Admitting: Internal Medicine

## 2023-01-02 ENCOUNTER — Telehealth: Payer: Self-pay | Admitting: Hematology and Oncology

## 2023-01-15 ENCOUNTER — Inpatient Hospital Stay: Payer: Medicare Other | Admitting: Hematology and Oncology

## 2023-01-15 ENCOUNTER — Inpatient Hospital Stay: Payer: Medicare Other

## 2023-01-27 ENCOUNTER — Inpatient Hospital Stay: Payer: Medicare Other | Admitting: Hematology and Oncology

## 2023-01-27 ENCOUNTER — Other Ambulatory Visit: Payer: Self-pay | Admitting: Hematology and Oncology

## 2023-01-27 ENCOUNTER — Inpatient Hospital Stay: Payer: Medicare Other | Attending: Hematology and Oncology

## 2023-01-27 DIAGNOSIS — Z86718 Personal history of other venous thrombosis and embolism: Secondary | ICD-10-CM

## 2023-01-27 DIAGNOSIS — Z7901 Long term (current) use of anticoagulants: Secondary | ICD-10-CM | POA: Insufficient documentation

## 2023-01-27 DIAGNOSIS — Z808 Family history of malignant neoplasm of other organs or systems: Secondary | ICD-10-CM | POA: Insufficient documentation

## 2023-01-27 DIAGNOSIS — I82409 Acute embolism and thrombosis of unspecified deep veins of unspecified lower extremity: Secondary | ICD-10-CM

## 2023-01-27 DIAGNOSIS — Z803 Family history of malignant neoplasm of breast: Secondary | ICD-10-CM | POA: Insufficient documentation

## 2023-01-27 DIAGNOSIS — Z87891 Personal history of nicotine dependence: Secondary | ICD-10-CM | POA: Insufficient documentation

## 2023-01-27 NOTE — Progress Notes (Deleted)
[facility] Telephone:([phone]   Fax:([phone]  PROGRESS NOTE  Patient Care Team: [name], MD as PCP - General (Internal Medicine) [name], MD (Family Medicine)  Hematological/Oncological History # Recurrent Lower Extremity DVT August 2006: originally diagnosed with a DVT from the level of the mid thigh (left superficial femoral vein, a part of the deep venous system) to the level of the trifurcation. Completed short course of anticoagulation July 2015 : he presented with pain and swelling of the right leg. Doppler US revealed  acute DVT involving the right lower extremity and chronic DVT involving the popliteal vein of the left lower extremity. Started on indefinite Xarelto 01/11/2021: underwent doppler ultrasound of the right lower extremity.  Findings revealed nonocclusive thrombus in the right popliteal vein and complete thrombus in the posterior tibial vein 01/23/2021: Establish care with Georga Kaufmann.  Transition to Eliquis therapy.  Interval History:  HENRIE DAVTYAN 82 y.o. male with medical history significant for recurrent lower extremity DVT presents for a follow up visit. The patient's last visit was on 01/09/2022. In the interim since the last visit he has continued on Eliquis therapy without difficulty.  On exam today Mr. Wolaver reports that he has had no changes in his health in the interim since her last visit.  He did celebrate his 82th birthday recently.  He notes that he is tolerating Eliquis well and taking it twice daily as prescribed.  He has had no missed dosages.  He reports no bleeding, bruising, or dark stools.  He reports the cost of the medication is "not an issue".  He has no signs or symptoms concerning for new blood clot.  He notes that he has no upcoming procedures such as dental work or surgeries.  Overall his health is steady.  His appetite is good and his energy levels are strong.  He has had no recent illnesses. He is at his  baseline level of health. he denies any fevers, chills, sweats, nausea, vomiting or diarrhea.  A full 10 point ROS is listed below.  MEDICAL HISTORY:  Past Medical History:  Diagnosis Date   Arthritis    BPH (benign prostatic hyperplasia)    DVT (deep venous thrombosis) (HCC)    x2 "Xarelto use"   Hyperlipidemia     SURGICAL HISTORY: Past Surgical History:  Procedure Laterality Date   APPENDECTOMY     CATARACT EXTRACTION Right    COLONOSCOPY W/ POLYPECTOMY     multiple   COLONOSCOPY WITH PROPOFOL N/A 10/30/2014   Procedure: COLONOSCOPY WITH PROPOFOL;  Surgeon: Charolett Bumpers, MD;  Location: [facility];  Service: Endoscopy;  Laterality: N/A;   EYE SURGERY     retinal repair bilateral   HERNIA REPAIR     BIH repair child   KNEE ARTHROSCOPY Left    SEPTOPLASTY     TONSILLECTOMY      SOCIAL HISTORY: Social History   Socioeconomic History   Marital status: Married    Spouse name: Lupita Leash   Number of children: 6   Years of education: 16   Highest education level: Not on file  Occupational History   Occupation: retired-CFO  Tobacco Use   Smoking status: Former    Current packs/day: 0.00    Types: Cigarettes    Quit date: 02/10/1980    Years since quitting: 42.9   Smokeless tobacco: Never  Substance and Sexual Activity   Alcohol use: Yes    Alcohol/week: 14.0 standard drinks of alcohol  Types: 14 Standard drinks or equivalent per week    Comment:  daily   Drug use: No   Sexual activity: Not on file  Other Topics Concern   Not on file  Social History Narrative   Health Care POA:    Emergency Contact: wife, Lupita Leash [phone]   End of Life Plan: Pt reports having a end of life plan and will bring copy for next visit   Who lives with you: wife, Lupita Leash   Any pets: none   Diet: Pt has a varied diet of protein, starch, vegetables   Exercise: Pt reports playing tennis and completing yard work at least 5 days per week   Seatbelts: Pt reports wearing seatbelt when in  vehicles.    Sun Exposure/Protection: Pt reports wearing ball cap but no sun block   Hobbies: tennis, biking, volunteering, yard work    Social Drivers of Corporate investment banker Strain: Not on [employer] Insecurity: Not on file  Transportation Needs: Not on file  Physical Activity: Not on file  Stress: Not on file  Social Connections: Not on file  Intimate Partner Violence: Not on file    FAMILY HISTORY: Family History  Problem Relation Age of Onset   Alzheimer's disease Mother    Arthritis Mother    Arthritis Father    Cancer Sister        breast   Cancer Brother        melanoma   Stroke Paternal Grandfather     ALLERGIES:  is allergic to simvastatin and warfarin sodium.  MEDICATIONS:  Current Outpatient Medications  Medication Sig Dispense Refill   acetaminophen (TYLENOL) 325 MG tablet Take 650 mg by mouth every 6 (six) hours as needed for mild pain.     donepezil (ARICEPT) 10 MG tablet TAKE 1 TABLET BY MOUTH ONCE DAILY 90 tablet 2   ELIQUIS 5 MG TABS tablet TAKE 1 TABLET BY MOUTH TWICE DAILY 60 tablet 10   lovastatin (MEVACOR) 20 MG tablet TAKE 1 TABLET BY MOUTH EVERY NIGHT AT BEDTIME 90 tablet 2   memantine (NAMENDA) 5 MG tablet TAKE 1 TABLET BY MOUTH TWICE DAILY 180 tablet 2   pantoprazole (PROTONIX) 40 MG tablet TAKE 1 TABLET BY MOUTH ONCE DAILY *DO NOT CRUSH OR CHEW* 90 tablet 2   tamsulosin (FLOMAX) 0.4 MG CAPS capsule TAKE 1 CAPSULE BY MOUTH ONCE DAILY *DO NOT CRUSH OR CHEW* **DO NOT OPEN CAPSULE** 90 capsule 2   traZODone (DESYREL) 50 MG tablet TAKE 1 TABLET AT BEDTIME AS NEEDED FOR SLEEP. MAY REPEAT ONCE IF STILL AWAKE. 100 tablet 3   No current facility-administered medications for this visit.    REVIEW OF SYSTEMS:   Constitutional: ( - ) fevers, ( - )  chills , ( - ) night sweats Eyes: ( - ) blurriness of vision, ( - ) double vision, ( - ) watery eyes Ears, nose, mouth, throat, and face: ( - ) mucositis, ( - ) sore throat Respiratory: ( - ) cough, (  - ) dyspnea, ( - ) wheezes Cardiovascular: ( - ) palpitation, ( - ) chest discomfort, ( - ) lower extremity swelling Gastrointestinal:  ( - ) nausea, ( - ) heartburn, ( - ) change in bowel habits Skin: ( - ) abnormal skin rashes Lymphatics: ( - ) new lymphadenopathy, ( - ) easy bruising Neurological: ( - ) numbness, ( - ) tingling, ( - ) new weaknesses Behavioral/Psych: ( - ) mood change, ( - )  new changes  All other systems were reviewed with the patient and are negative.  PHYSICAL EXAMINATION:  There were no vitals filed for this visit.  There were no vitals filed for this visit.   GENERAL: Well-appearing elderly Caucasian male, alert, no distress and comfortable SKIN: skin color, texture, turgor are normal, no rashes or significant lesions EYES: conjunctiva are pink and non-injected, sclera clear LUNGS: clear to auscultation and percussion with normal breathing effort HEART: regular rate & rhythm and no murmurs and no lower extremity edema Musculoskeletal: no cyanosis of digits and no clubbing  PSYCH: alert & oriented x 3, fluent speech NEURO: no focal motor/sensory deficits  LABORATORY DATA:  I have reviewed the data as listed    Latest Ref Rng & Units 07/17/2022    7:44 AM 01/09/2022    1:48 PM 07/10/2021    2:17 PM  CBC  WBC 4.0 - 10.5 K/uL 4.4  4.3  4.7   Hemoglobin 13.0 - 17.0 g/dL 27.0  35.0  09.3   Hematocrit 39.0 - 52.0 % 48.6  48.0  47.9   Platelets 150 - 400 K/uL 161  177  179        Latest Ref Rng & Units 07/17/2022    7:44 AM 01/09/2022    1:48 PM 07/10/2021    2:17 PM  CMP  Glucose 70 - 99 mg/dL 818  299  371   BUN 8 - 23 mg/dL 24  17  21    Creatinine 0.61 - 1.24 mg/dL 6.96  7.89  3.81   Sodium 135 - 145 mmol/L 139  139  139   Potassium 3.5 - 5.1 mmol/L 4.1  4.3  4.1   Chloride 98 - 111 mmol/L 105  105  105   CO2 22 - 32 mmol/L 26  27  28    Calcium 8.9 - 10.3 mg/dL 9.5  9.6  9.7   Total Protein 6.5 - 8.1 g/dL 7.4  7.4  7.2   Total Bilirubin 0.3 - 1.2 mg/dL  1.1  1.2  1.2   Alkaline Phos 38 - 126 U/L 64  57  54   AST 15 - 41 U/L 21  22  22    ALT 0 - 44 U/L 16  18  20      RADIOGRAPHIC STUDIES: No results found.  ASSESSMENT & PLAN HANZO FLYTE 82 y.o. male with medical history significant for recurrent lower extremity DVT presents for a follow up visit.  Due to recurrent DVTs that were clearly unprovoked, the recommendation is indefinite anticoagulation. Since patient failed Xarelto with recent DVT of the right lower extremity, we recommended to switch to Eliquis.    Patient underwent a hypercoagulable workup to rule out any clotting disorders or genetic mutations that predispose him to venous thromboembolisms.  The results of these tests showed no evidence of a hypercoagulable disorder.   #Recurrent DVTs, bilateral --Felt to be unprovoked without any definitive risk factors so recommend indefinite anticoagulation.  --Patient failed Xarelto with recent DVT in December 2022.  --Patient has allergy to Warfarin with reported rash.  --Recommend continuation of Eliquis 5 mg twice daily. --Labs today to check CBC, CMP -- hypercoagulation workup with lupus anticoagulant, beta-2 glycoprotein antibodies, cardiolipin antibodies, protein C level and activity, protein S level and activity showed no abnormalities.  -- previously discussed maintenance dosing but patient and his daughter would like to continue on full-strength Eliquis. Continue 5 mg BID. --Labs today are adequate for continued treatment with DOAC: White blood  cell 4.4, Hgb 17.2, MCV 94.6, Plt 161 --RTC in 6 months with labs.    No orders of the defined types were placed in this encounter.   All questions were answered. The patient knows to call the clinic with any problems, questions or concerns.  A total of more than 25 minutes were spent on this encounter with face-to-face time and non-face-to-face time, including preparing to see the patient, ordering tests and/or medications,  counseling the patient and coordination of care as outlined above.   Ulysees Barns, MD Department of Hematology/Oncology [facility] at [facility] Phone: [phone] Pager: [phone] Email: [email or web address]  01/27/2023 12:14 PM

## 2023-01-29 ENCOUNTER — Telehealth: Payer: Self-pay | Admitting: Hematology and Oncology

## 2023-02-03 NOTE — Progress Notes (Unsigned)
[facility] [phone]   [phone]  PROGRESS NOTE  Patient Care Team: [name], MD as PCP - General (Internal Medicine) [name], MD (Family Medicine)  Hematological/Oncological History # Recurrent Lower Extremity DVT August 2006: originally diagnosed with a DVT from the level of the mid thigh (left superficial femoral vein, a part of the deep venous system) to the level of the trifurcation. Completed short course of anticoagulation July 2015 : he presented with pain and swelling of the right leg. Doppler US revealed  acute DVT involving the right lower extremity and chronic DVT involving the popliteal vein of the left lower extremity. Started on indefinite Xarelto 01/11/2021: underwent doppler ultrasound of the right lower extremity.  Findings revealed nonocclusive thrombus in the right popliteal vein and complete thrombus in the posterior tibial vein 01/23/2021: Establish care with Georga Kaufmann.  Transition to Eliquis therapy.  Interval History:  Ryan [name] 82 y.o. male with medical history significant for recurrent lower extremity DVT presents for a follow up visit. The patient's last visit was on 07/17/2022. In the interim since the last visit he has continued on Eliquis therapy without difficulty.  On exam today Ryan [name] reports he has been well overall and interim since her last visit 6 months ago.  He reports he is had no new medications and no major changes in his health.  He reports he is taking his Eliquis twice daily, though he needs to check his pillbox as he thinks he may have occasionally taken it daily.  He reports that he is not having any bleeding, bruising, or dark stools.  He reports he has no signs or symptoms concerning for recurrent VTE such as leg pain, leg swelling, chest pain, shortness of breath.  He reports he walks a lot and is quite active.  His energy levels are good and his appetite is strong.  He has had no issues  with the cost of the medication.  Overall he feels well and has no questions concerns or complaints.  He is at his baseline level of health. he denies any fevers, chills, sweats, nausea, vomiting or diarrhea.  A full 10 point ROS is listed below.  MEDICAL HISTORY:  Past Medical History:  Diagnosis Date   Arthritis    BPH (benign prostatic hyperplasia)    DVT (deep venous thrombosis) (HCC)    x2 "Xarelto use"   Hyperlipidemia     SURGICAL HISTORY: Past Surgical History:  Procedure Laterality Date   APPENDECTOMY     CATARACT EXTRACTION Right    COLONOSCOPY W/ POLYPECTOMY     multiple   COLONOSCOPY WITH PROPOFOL N/A 10/30/2014   Procedure: COLONOSCOPY WITH PROPOFOL;  Surgeon: Charolett Bumpers, MD;  Location: [facility];  Service: Endoscopy;  Laterality: N/A;   EYE SURGERY     retinal repair bilateral   HERNIA REPAIR     BIH repair child   KNEE ARTHROSCOPY Left    SEPTOPLASTY     TONSILLECTOMY      SOCIAL HISTORY: Social History   Socioeconomic History   Marital status: Married    Spouse name: Ryan [name]   Number of children: 6   Years of education: 16   Highest education level: Not on file  Occupational History   Occupation: retired-CFO  Tobacco Use   Smoking status: Former    Current packs/day: 0.00    Types: Cigarettes    Quit date: 02/10/1980    Years since quitting: 43.0   Smokeless tobacco:  Never  Substance and Sexual Activity   Alcohol use: Yes    Alcohol/week: 14.0 standard drinks of alcohol    Types: 14 Standard drinks or equivalent per week    Comment:  daily   Drug use: No   Sexual activity: Not on file  Other Topics Concern   Not on file  Social History Narrative   Health Care POA:    Emergency Contact: wife, Ryan [name] [phone]   End of Life Plan: Pt reports having a end of life plan and will bring copy for next visit   Who lives with you: wife, Ryan [name]   Any pets: none   Diet: Pt has a varied diet of protein, starch, vegetables   Exercise: Pt reports  playing tennis and completing yard work at least 5 days per week   Seatbelts: Pt reports wearing seatbelt when in vehicles.    Sun Exposure/Protection: Pt reports wearing ball cap but no sun block   Hobbies: tennis, biking, volunteering, yard work    Social Drivers of Corporate investment banker Strain: Not on [employer] Insecurity: Not on file  Transportation Needs: Not on file  Physical Activity: Not on file  Stress: Not on file  Social Connections: Not on file  Intimate Partner Violence: Not on file    FAMILY HISTORY: Family History  Problem Relation Age of Onset   Alzheimer's disease Mother    Arthritis Mother    Arthritis Father    Cancer Sister        breast   Cancer Brother        melanoma   Stroke Paternal Grandfather     ALLERGIES:  is allergic to simvastatin and warfarin sodium.  MEDICATIONS:  Current Outpatient Medications  Medication Sig Dispense Refill   acetaminophen (TYLENOL) 325 MG tablet Take 650 mg by mouth every 6 (six) hours as needed for mild pain.     donepezil (ARICEPT) 10 MG tablet TAKE 1 TABLET BY MOUTH ONCE DAILY 90 tablet 2   ELIQUIS 5 MG TABS tablet TAKE 1 TABLET BY MOUTH TWICE DAILY 60 tablet 10   lovastatin (MEVACOR) 20 MG tablet TAKE 1 TABLET BY MOUTH EVERY NIGHT AT BEDTIME 90 tablet 2   memantine (NAMENDA) 5 MG tablet TAKE 1 TABLET BY MOUTH TWICE DAILY 180 tablet 2   pantoprazole (PROTONIX) 40 MG tablet TAKE 1 TABLET BY MOUTH ONCE DAILY *DO NOT CRUSH OR CHEW* 90 tablet 2   tamsulosin (FLOMAX) 0.4 MG CAPS capsule TAKE 1 CAPSULE BY MOUTH ONCE DAILY *DO NOT CRUSH OR CHEW* **DO NOT OPEN CAPSULE** 90 capsule 2   traZODone (DESYREL) 50 MG tablet TAKE 1 TABLET AT BEDTIME AS NEEDED FOR SLEEP. MAY REPEAT ONCE IF STILL AWAKE. 100 tablet 3   No current facility-administered medications for this visit.    REVIEW OF SYSTEMS:   Constitutional: ( - ) fevers, ( - )  chills , ( - ) night sweats Eyes: ( - ) blurriness of vision, ( - ) double vision, ( - )  watery eyes Ears, nose, mouth, throat, and face: ( - ) mucositis, ( - ) sore throat Respiratory: ( - ) cough, ( - ) dyspnea, ( - ) wheezes Cardiovascular: ( - ) palpitation, ( - ) chest discomfort, ( - ) lower extremity swelling Gastrointestinal:  ( - ) nausea, ( - ) heartburn, ( - ) change in bowel habits Skin: ( - ) abnormal skin rashes Lymphatics: ( - ) new lymphadenopathy, ( - ) easy  bruising Neurological: ( - ) numbness, ( - ) tingling, ( - ) new weaknesses Behavioral/Psych: ( - ) mood change, ( - ) new changes  All other systems were reviewed with the patient and are negative.  PHYSICAL EXAMINATION:  Vitals:   02/04/23 0921  BP: 136/72  Pulse: 60  Resp: 15  Temp: 97.9 F (36.6 C)  SpO2: 97%    Filed Weights   02/04/23 0921  Weight: 206 lb 3.2 oz (93.5 kg)     GENERAL: Well-appearing elderly Caucasian male, alert, no distress and comfortable SKIN: skin color, texture, turgor are normal, no rashes or significant lesions EYES: conjunctiva are pink and non-injected, sclera clear LUNGS: clear to auscultation and percussion with normal breathing effort HEART: regular rate & rhythm and no murmurs and no lower extremity edema Musculoskeletal: no cyanosis of digits and no clubbing  PSYCH: alert & oriented x 3, fluent speech NEURO: no focal motor/sensory deficits  LABORATORY DATA:  I have reviewed the data as listed    Latest Ref Rng & Units 02/04/2023    8:33 AM 07/17/2022    7:44 AM 01/09/2022    1:48 PM  CBC  WBC 4.0 - 10.5 K/uL 4.4  4.4  4.3   Hemoglobin 13.0 - 17.0 g/dL 40.9  81.1  91.4   Hematocrit 39.0 - 52.0 % 47.9  48.6  48.0   Platelets 150 - 400 K/uL 168  161  177        Latest Ref Rng & Units 02/04/2023    8:33 AM 07/17/2022    7:44 AM 01/09/2022    1:48 PM  CMP  Glucose 70 - 99 mg/dL 782  956  213   BUN 8 - 23 mg/dL 19  24  17    Creatinine 0.61 - 1.24 mg/dL 0.86  5.78  4.69   Sodium 135 - 145 mmol/L 140  139  139   Potassium 3.5 - 5.1 mmol/L 4.2  4.1   4.3   Chloride 98 - 111 mmol/L 106  105  105   CO2 22 - 32 mmol/L 28  26  27    Calcium 8.9 - 10.3 mg/dL 9.6  9.5  9.6   Total Protein 6.5 - 8.1 g/dL 7.0  7.4  7.4   Total Bilirubin <1.2 mg/dL 0.9  1.1  1.2   Alkaline Phos 38 - 126 U/L 60  64  57   AST 15 - 41 U/L 21  21  22    ALT 0 - 44 U/L 18  16  18      RADIOGRAPHIC STUDIES: No results found.  ASSESSMENT & PLAN Ryan [name] 82 y.o. male with medical history significant for recurrent lower extremity DVT presents for a follow up visit.  Due to recurrent DVTs that were clearly unprovoked, the recommendation is indefinite anticoagulation. Since patient failed Xarelto with recent DVT of the right lower extremity, we recommended to switch to Eliquis.    Patient underwent a hypercoagulable workup to rule out any clotting disorders or genetic mutations that predispose him to venous thromboembolisms.  The results of these tests showed no evidence of a hypercoagulable disorder.   #Recurrent DVTs, bilateral --Felt to be unprovoked without any definitive risk factors so recommend indefinite anticoagulation.  --Patient failed Xarelto with recent DVT in December 2022.  --Patient has allergy to Warfarin with reported rash.  --Recommend continuation of Eliquis 5 mg twice daily. --Labs today to check CBC, CMP -- hypercoagulation workup with lupus anticoagulant, beta-2 glycoprotein antibodies, cardiolipin  antibodies, protein C level and activity, protein S level and activity showed no abnormalities.  -- previously discussed maintenance dosing but patient and his daughter would like to continue on full-strength Eliquis. Continue 5 mg BID. --Labs today are adequate for continued treatment with DOAC: White blood cell 4.4, Hgb 16.7, MCV 95.4, Plt 168 --RTC in 6 months with labs.    No orders of the defined types were placed in this encounter.   All questions were answered. The patient knows to call the clinic with any problems, questions or  concerns.  A total of more than 25 minutes were spent on this encounter with face-to-face time and non-face-to-face time, including preparing to see the patient, ordering tests and/or medications, counseling the patient and coordination of care as outlined above.   Ulysees Barns, MD Department of Hematology/Oncology [facility] at [facility] Phone: [phone] Pager: [phone] Email: [email or web address]  02/04/2023 9:51 AM

## 2023-02-04 ENCOUNTER — Inpatient Hospital Stay: Payer: Medicare Other | Admitting: Hematology and Oncology

## 2023-02-04 ENCOUNTER — Inpatient Hospital Stay: Payer: Medicare Other

## 2023-02-04 VITALS — BP 136/72 | HR 60 | Temp 97.9°F | Resp 15 | Wt 206.2 lb

## 2023-02-04 DIAGNOSIS — Z86718 Personal history of other venous thrombosis and embolism: Secondary | ICD-10-CM | POA: Diagnosis not present

## 2023-02-04 DIAGNOSIS — Z7901 Long term (current) use of anticoagulants: Secondary | ICD-10-CM | POA: Diagnosis not present

## 2023-02-04 DIAGNOSIS — Z87891 Personal history of nicotine dependence: Secondary | ICD-10-CM | POA: Diagnosis not present

## 2023-02-04 DIAGNOSIS — Z803 Family history of malignant neoplasm of breast: Secondary | ICD-10-CM | POA: Diagnosis not present

## 2023-02-04 DIAGNOSIS — I82409 Acute embolism and thrombosis of unspecified deep veins of unspecified lower extremity: Secondary | ICD-10-CM

## 2023-02-04 DIAGNOSIS — Z808 Family history of malignant neoplasm of other organs or systems: Secondary | ICD-10-CM | POA: Diagnosis not present

## 2023-02-04 LAB — CMP (CANCER CENTER ONLY)
ALT: 18 U/L (ref 0–44)
AST: 21 U/L (ref 15–41)
Albumin: 4.3 g/dL (ref 3.5–5.0)
Alkaline Phosphatase: 60 U/L (ref 38–126)
Anion gap: 6 (ref 5–15)
BUN: 19 mg/dL (ref 8–23)
CO2: 28 mmol/L (ref 22–32)
Calcium: 9.6 mg/dL (ref 8.9–10.3)
Chloride: 106 mmol/L (ref 98–111)
Creatinine: 1.26 mg/dL — ABNORMAL HIGH (ref 0.61–1.24)
GFR, Estimated: 56 mL/min — ABNORMAL LOW (ref >60)
Glucose, Bld: 101 mg/dL — ABNORMAL HIGH (ref 70–99)
Potassium: 4.2 mmol/L (ref 3.5–5.1)
Sodium: 140 mmol/L (ref 135–145)
Total Bilirubin: 0.9 mg/dL (ref <1.2)
Total Protein: 7 g/dL (ref 6.5–8.1)

## 2023-02-04 LAB — CBC WITH DIFFERENTIAL (CANCER CENTER ONLY)
Abs Immature Granulocytes: 0.01 10*3/uL (ref 0.00–0.07)
Basophils Absolute: 0.1 10*3/uL (ref 0.0–0.1)
Basophils Relative: 1 %
Eosinophils Absolute: 0.1 10*3/uL (ref 0.0–0.5)
Eosinophils Relative: 2 %
HCT: 47.9 % (ref 39.0–52.0)
Hemoglobin: 16.7 g/dL (ref 13.0–17.0)
Immature Granulocytes: 0 %
Lymphocytes Relative: 36 %
Lymphs Abs: 1.6 10*3/uL (ref 0.7–4.0)
MCH: 33.3 pg (ref 26.0–34.0)
MCHC: 34.9 g/dL (ref 30.0–36.0)
MCV: 95.4 fL (ref 80.0–100.0)
Monocytes Absolute: 0.6 10*3/uL (ref 0.1–1.0)
Monocytes Relative: 14 %
Neutro Abs: 2 10*3/uL (ref 1.7–7.7)
Neutrophils Relative %: 47 %
Platelet Count: 168 10*3/uL (ref 150–400)
RBC: 5.02 MIL/uL (ref 4.22–5.81)
RDW: 13 % (ref 11.5–15.5)
WBC Count: 4.4 10*3/uL (ref 4.0–10.5)
nRBC: 0 % (ref 0.0–0.2)

## 2023-02-17 ENCOUNTER — Other Ambulatory Visit: Payer: Self-pay | Admitting: Internal Medicine

## 2023-02-17 ENCOUNTER — Other Ambulatory Visit: Payer: Self-pay

## 2023-02-17 MED ORDER — PAXLOVID (300/100) 20 X 150 MG & 10 X 100MG PO TBPK: 3.0000 | ORAL_TABLET | Freq: Two times a day (BID) | ORAL | 0 refills | Status: DC

## 2023-02-18 ENCOUNTER — Other Ambulatory Visit: Payer: Self-pay | Admitting: Internal Medicine

## 2023-02-18 MED ORDER — PAXLOVID (300/100) 20 X 150 MG & 10 X 100MG PO TBPK: 3.0000 | ORAL_TABLET | Freq: Two times a day (BID) | ORAL | 0 refills | Status: AC

## 2023-03-29 ENCOUNTER — Other Ambulatory Visit: Payer: Self-pay | Admitting: Internal Medicine

## 2023-04-01 ENCOUNTER — Other Ambulatory Visit: Payer: Self-pay

## 2023-04-01 MED ORDER — TRAZODONE HCL 50 MG PO TABS: ORAL_TABLET | ORAL | 3 refills | Status: AC

## 2023-05-10 ENCOUNTER — Other Ambulatory Visit: Payer: Self-pay | Admitting: Internal Medicine

## 2023-05-10 DIAGNOSIS — F0390 Unspecified dementia without behavioral disturbance: Secondary | ICD-10-CM

## 2023-08-05 ENCOUNTER — Inpatient Hospital Stay: Payer: Medicare Other | Attending: Hematology and Oncology

## 2023-08-05 ENCOUNTER — Inpatient Hospital Stay: Payer: Medicare Other | Admitting: Hematology and Oncology

## 2023-08-05 ENCOUNTER — Other Ambulatory Visit: Payer: Self-pay | Admitting: Hematology and Oncology

## 2023-08-05 VITALS — BP 139/65 | HR 91 | Temp 97.7°F | Resp 18 | Wt 201.7 lb

## 2023-08-05 DIAGNOSIS — I82409 Acute embolism and thrombosis of unspecified deep veins of unspecified lower extremity: Secondary | ICD-10-CM

## 2023-08-05 DIAGNOSIS — Z87891 Personal history of nicotine dependence: Secondary | ICD-10-CM | POA: Insufficient documentation

## 2023-08-05 DIAGNOSIS — Z803 Family history of malignant neoplasm of breast: Secondary | ICD-10-CM | POA: Diagnosis not present

## 2023-08-05 DIAGNOSIS — Z86718 Personal history of other venous thrombosis and embolism: Secondary | ICD-10-CM | POA: Insufficient documentation

## 2023-08-05 DIAGNOSIS — Z7901 Long term (current) use of anticoagulants: Secondary | ICD-10-CM | POA: Insufficient documentation

## 2023-08-05 DIAGNOSIS — Z808 Family history of malignant neoplasm of other organs or systems: Secondary | ICD-10-CM | POA: Diagnosis not present

## 2023-08-05 LAB — CBC WITH DIFFERENTIAL (CANCER CENTER ONLY)
Abs Immature Granulocytes: 0.01 10*3/uL (ref 0.00–0.07)
Basophils Absolute: 0.1 10*3/uL (ref 0.0–0.1)
Basophils Relative: 1 %
Eosinophils Absolute: 0.1 10*3/uL (ref 0.0–0.5)
Eosinophils Relative: 2 %
HCT: 47.9 % (ref 39.0–52.0)
Hemoglobin: 16.5 g/dL (ref 13.0–17.0)
Immature Granulocytes: 0 %
Lymphocytes Relative: 30 %
Lymphs Abs: 1.3 10*3/uL (ref 0.7–4.0)
MCH: 32.7 pg (ref 26.0–34.0)
MCHC: 34.4 g/dL (ref 30.0–36.0)
MCV: 95 fL (ref 80.0–100.0)
Monocytes Absolute: 0.7 10*3/uL (ref 0.1–1.0)
Monocytes Relative: 15 %
Neutro Abs: 2.3 10*3/uL (ref 1.7–7.7)
Neutrophils Relative %: 52 %
Platelet Count: 172 10*3/uL (ref 150–400)
RBC: 5.04 MIL/uL (ref 4.22–5.81)
RDW: 13.5 % (ref 11.5–15.5)
WBC Count: 4.4 10*3/uL (ref 4.0–10.5)
nRBC: 0 % (ref 0.0–0.2)

## 2023-08-05 LAB — CMP (CANCER CENTER ONLY)
ALT: 15 U/L (ref 0–44)
AST: 21 U/L (ref 15–41)
Albumin: 4.5 g/dL (ref 3.5–5.0)
Alkaline Phosphatase: 57 U/L (ref 38–126)
Anion gap: 7 (ref 5–15)
BUN: 20 mg/dL (ref 8–23)
CO2: 28 mmol/L (ref 22–32)
Calcium: 9.6 mg/dL (ref 8.9–10.3)
Chloride: 106 mmol/L (ref 98–111)
Creatinine: 1.27 mg/dL — ABNORMAL HIGH (ref 0.61–1.24)
GFR, Estimated: 55 mL/min — ABNORMAL LOW (ref >60)
Glucose, Bld: 103 mg/dL — ABNORMAL HIGH (ref 70–99)
Potassium: 4.1 mmol/L (ref 3.5–5.1)
Sodium: 141 mmol/L (ref 135–145)
Total Bilirubin: 1.2 mg/dL (ref 0.0–1.2)
Total Protein: 7.3 g/dL (ref 6.5–8.1)

## 2023-08-05 NOTE — Progress Notes (Signed)
 [facility] Telephone:(336) [phone]   Fax:(336) [phone]  PROGRESS NOTE  Patient Care Team: Caleen Dirks, MD as PCP - General (Internal Medicine) Harvey Seltzer, MD (Family Medicine)  Hematological/Oncological History # Recurrent Lower Extremity DVT August 2006: originally diagnosed with a DVT from the level of the mid thigh (left superficial femoral vein, a part of the deep venous system) to the level of the trifurcation. Completed short course of anticoagulation July 2015 : he presented with pain and swelling of the right leg. Doppler US  revealed  acute DVT involving the right lower extremity and chronic DVT involving the popliteal vein of the left lower extremity. Started on indefinite Xarelto  01/11/2021: underwent doppler ultrasound of the right lower extremity.  Findings revealed nonocclusive thrombus in the right popliteal vein and complete thrombus in the posterior tibial vein 01/23/2021: Establish care with [name].  Transition to Eliquis  therapy.  Interval History:  Ryan [name] 83 y.o. male with medical history significant for recurrent lower extremity DVT presents for a follow up visit. The patient's last visit was on 02/04/2023. In the interim since the last visit he has continued on Eliquis  therapy without difficulty.  On exam today Ryan [name] reports he has been well overall in interim since her last visit.  He is had no major changes in his health with no hospitalizations, ER visits, or new medications.  He reports his energy is good for him to do everything he wants and needs to do.  He notes that he will be staying local for the summer and is not traveling.  He reports that he has had no trouble with bleeding, bruising, or dark stools.  Notes he is taking the medication faithfully.  He is unsure of the cause but reports that it is not inhibitive.  He is not having any signs or symptoms concerning for recurrent VTE such as leg pain, leg swelling, chest pain, or  shortness of breath.  A full 10 point ROS is otherwise negative.  MEDICAL HISTORY:  Past Medical History:  Diagnosis Date   Arthritis    BPH (benign prostatic hyperplasia)    DVT (deep venous thrombosis) (HCC)    x2 Xarelto  use   Hyperlipidemia     SURGICAL HISTORY: Past Surgical History:  Procedure Laterality Date   APPENDECTOMY     CATARACT EXTRACTION Right    COLONOSCOPY W/ POLYPECTOMY     multiple   COLONOSCOPY WITH PROPOFOL  N/A 10/30/2014   Procedure: COLONOSCOPY WITH PROPOFOL ;  Surgeon: Gladis MARLA Louder, MD;  Location: [facility];  Service: Endoscopy;  Laterality: N/A;   EYE SURGERY     retinal repair bilateral   HERNIA REPAIR     BIH repair child   KNEE ARTHROSCOPY Left    SEPTOPLASTY     TONSILLECTOMY      SOCIAL HISTORY: Social History   Socioeconomic History   Marital status: Married    Spouse name: Arland   Number of children: 6   Years of education: 16   Highest education level: Not on file  Occupational History   Occupation: retired-CFO  Tobacco Use   Smoking status: Former    Current packs/day: 0.00    Types: Cigarettes    Quit date: 02/10/1980    Years since quitting: 43.5   Smokeless tobacco: Never  Substance and Sexual Activity   Alcohol use: Yes    Alcohol/week: 14.0 standard drinks of alcohol    Types: 14 Standard drinks or equivalent per week    Comment:  daily   Drug use: No   Sexual activity: Not on file  Other Topics Concern   Not on file  Social History Narrative   Health Care POA:    Emergency Contact: wife, Arland [phone]   End of Life Plan: Pt reports having a end of life plan and will bring copy for next visit   Who lives with you: wife, Arland   Any pets: none   Diet: Pt has a varied diet of protein, starch, vegetables   Exercise: Pt reports playing tennis and completing yard work at least 5 days per week   Seatbelts: Pt reports wearing seatbelt when in vehicles.    Sun Exposure/Protection: Pt reports wearing ball cap  but no sun block   Hobbies: tennis, biking, volunteering, yard work    Social Drivers of Corporate investment banker Strain: Not on [employer] Insecurity: Not on file  Transportation Needs: Not on file  Physical Activity: Not on file  Stress: Not on file  Social Connections: Not on file  Intimate Partner Violence: Not on file    FAMILY HISTORY: Family History  Problem Relation Age of Onset   Alzheimer's disease Mother    Arthritis Mother    Arthritis Father    Cancer Sister        breast   Cancer Brother        melanoma   Stroke Paternal Grandfather     ALLERGIES:  is allergic to simvastatin and warfarin sodium.  MEDICATIONS:  Current Outpatient Medications  Medication Sig Dispense Refill   acetaminophen  (TYLENOL ) 325 MG tablet Take 650 mg by mouth every 6 (six) hours as needed for mild pain.     donepezil  (ARICEPT ) 10 MG tablet TAKE 1 TABLET BY MOUTH ONCE DAILY 90 tablet 2   ELIQUIS  5 MG TABS tablet TAKE 1 TABLET BY MOUTH TWICE DAILY 180 tablet 2   lovastatin  (MEVACOR ) 20 MG tablet TAKE 1 TABLET BY MOUTH EVERY NIGHT AT BEDTIME 90 tablet 2   memantine  (NAMENDA ) 5 MG tablet TAKE 1 TABLET BY MOUTH TWICE DAILY 180 tablet 2   pantoprazole (PROTONIX) 40 MG tablet TAKE 1 TABLET BY MOUTH ONCE DAILY *DO NOT CRUSH OR CHEW* 90 tablet 2   tamsulosin  (FLOMAX ) 0.4 MG CAPS capsule TAKE 1 CAPSULE BY MOUTH ONCE DAILY *DO NOT CRUSH OR CHEW* **DO NOT OPEN CAPSULE** 90 capsule 2   traZODone  (DESYREL ) 50 MG tablet TAKE 1 TABLET AT BEDTIME AS NEEDED FOR SLEEP. MAY REPEAT ONCE IF STILL AWAKE. 100 tablet 3   No current facility-administered medications for this visit.    REVIEW OF SYSTEMS:   Constitutional: ( - ) fevers, ( - )  chills , ( - ) night sweats Eyes: ( - ) blurriness of vision, ( - ) double vision, ( - ) watery eyes Ears, nose, mouth, throat, and face: ( - ) mucositis, ( - ) sore throat Respiratory: ( - ) cough, ( - ) dyspnea, ( - ) wheezes Cardiovascular: ( - ) palpitation, ( -  ) chest discomfort, ( - ) lower extremity swelling Gastrointestinal:  ( - ) nausea, ( - ) heartburn, ( - ) change in bowel habits Skin: ( - ) abnormal skin rashes Lymphatics: ( - ) new lymphadenopathy, ( - ) easy bruising Neurological: ( - ) numbness, ( - ) tingling, ( - ) new weaknesses Behavioral/Psych: ( - ) mood change, ( - ) new changes  All other systems were reviewed with the patient and  are negative.  PHYSICAL EXAMINATION:  There were no vitals filed for this visit.   There were no vitals filed for this visit.    GENERAL: Well-appearing elderly Caucasian male, alert, no distress and comfortable SKIN: skin color, texture, turgor are normal, no rashes or significant lesions EYES: conjunctiva are pink and non-injected, sclera clear LUNGS: clear to auscultation and percussion with normal breathing effort HEART: regular rate & rhythm and no murmurs and no lower extremity edema Musculoskeletal: no cyanosis of digits and no clubbing  PSYCH: alert & oriented x 3, fluent speech NEURO: no focal motor/sensory deficits  LABORATORY DATA:  I have reviewed the data as listed    Latest Ref Rng & Units 02/04/2023    8:33 AM 07/17/2022    7:44 AM 01/09/2022    1:48 PM  CBC  WBC 4.0 - 10.5 K/uL 4.4  4.4  4.3   Hemoglobin 13.0 - 17.0 g/dL 83.2  82.7  83.3   Hematocrit 39.0 - 52.0 % 47.9  48.6  48.0   Platelets 150 - 400 K/uL 168  161  177        Latest Ref Rng & Units 02/04/2023    8:33 AM 07/17/2022    7:44 AM 01/09/2022    1:48 PM  CMP  Glucose 70 - 99 mg/dL 898  883  884   BUN 8 - 23 mg/dL 19  24  17    Creatinine 0.61 - 1.24 mg/dL 8.73  8.70  8.70   Sodium 135 - 145 mmol/L 140  139  139   Potassium 3.5 - 5.1 mmol/L 4.2  4.1  4.3   Chloride 98 - 111 mmol/L 106  105  105   CO2 22 - 32 mmol/L 28  26  27    Calcium  8.9 - 10.3 mg/dL 9.6  9.5  9.6   Total Protein 6.5 - 8.1 g/dL 7.0  7.4  7.4   Total Bilirubin <1.2 mg/dL 0.9  1.1  1.2   Alkaline Phos 38 - 126 U/L 60  64  57   AST 15  - 41 U/L 21  21  22    ALT 0 - 44 U/L 18  16  18      RADIOGRAPHIC STUDIES: No results found.  ASSESSMENT & PLAN Ryan [name] 83 y.o. male with medical history significant for recurrent lower extremity DVT presents for a follow up visit.  Due to recurrent DVTs that were clearly unprovoked, the recommendation is indefinite anticoagulation. Since patient failed Xarelto  with recent DVT of the right lower extremity, we recommended to switch to Eliquis .    Patient underwent a hypercoagulable workup to rule out any clotting disorders or genetic mutations that predispose him to venous thromboembolisms.  The results of these tests showed no evidence of a hypercoagulable disorder.   #Recurrent DVTs, bilateral --Felt to be unprovoked without any definitive risk factors so recommend indefinite anticoagulation.  --Patient failed Xarelto  with recent DVT in December 2022.  --Patient has allergy to Warfarin with reported rash.  --Labs today to check CBC, CMP -- hypercoagulation workup with lupus anticoagulant, beta-2 glycoprotein antibodies, cardiolipin antibodies, protein C level and activity, protein S level and activity showed no abnormalities.  PLAN:  -- previously discussed maintenance dosing but patient and his daughter would like to continue on full-strength Eliquis . Continue 5 mg BID. --Recommend continuation of Eliquis  5 mg twice daily. --Labs today are adequate for continued treatment with DOAC: White blood cell 4.4, hemoglobin 16.5, MCV 95, platelets 172.  Creatinine  and LFTs adequate for continued DOAC therapy. --RTC in 6 months with labs.    No orders of the defined types were placed in this encounter.   All questions were answered. The patient knows to call the clinic with any problems, questions or concerns.  A total of more than 25 minutes were spent on this encounter with face-to-face time and non-face-to-face time, including preparing to see the patient, ordering tests and/or  medications, counseling the patient and coordination of care as outlined above.   Norleen IVAR Kidney, MD Department of Hematology/Oncology [facility] at [facility] Phone: [phone] Pager: [phone] Email: [email or web address]  08/05/2023 7:24 AM

## 2023-08-12 ENCOUNTER — Other Ambulatory Visit: Payer: Self-pay | Admitting: Internal Medicine

## 2023-08-12 ENCOUNTER — Telehealth: Payer: Self-pay | Admitting: Hematology and Oncology

## 2023-08-12 DIAGNOSIS — E7800 Pure hypercholesterolemia, unspecified: Secondary | ICD-10-CM

## 2023-08-12 NOTE — Telephone Encounter (Signed)
 Scheduled appointments per 6/26 los. Called and left a VM with appointment details for the patient.

## 2023-09-14 ENCOUNTER — Other Ambulatory Visit: Payer: Self-pay | Admitting: Internal Medicine

## 2024-01-21 ENCOUNTER — Other Ambulatory Visit: Payer: Self-pay

## 2024-01-21 DIAGNOSIS — I712 Thoracic aortic aneurysm, without rupture, unspecified: Secondary | ICD-10-CM

## 2024-01-21 NOTE — Progress Notes (Signed)
 ct

## 2024-02-07 ENCOUNTER — Other Ambulatory Visit: Payer: Self-pay | Admitting: Physician Assistant

## 2024-02-07 DIAGNOSIS — I82409 Acute embolism and thrombosis of unspecified deep veins of unspecified lower extremity: Secondary | ICD-10-CM

## 2024-02-08 ENCOUNTER — Inpatient Hospital Stay: Admitting: Physician Assistant

## 2024-02-08 ENCOUNTER — Inpatient Hospital Stay: Attending: Hematology and Oncology

## 2024-02-08 VITALS — BP 138/74 | HR 45 | Temp 97.8°F | Resp 18 | Wt 196.9 lb

## 2024-02-08 DIAGNOSIS — Z87891 Personal history of nicotine dependence: Secondary | ICD-10-CM | POA: Insufficient documentation

## 2024-02-08 DIAGNOSIS — Z803 Family history of malignant neoplasm of breast: Secondary | ICD-10-CM | POA: Diagnosis not present

## 2024-02-08 DIAGNOSIS — Z7901 Long term (current) use of anticoagulants: Secondary | ICD-10-CM | POA: Insufficient documentation

## 2024-02-08 DIAGNOSIS — I82409 Acute embolism and thrombosis of unspecified deep veins of unspecified lower extremity: Secondary | ICD-10-CM

## 2024-02-08 DIAGNOSIS — Z86718 Personal history of other venous thrombosis and embolism: Secondary | ICD-10-CM | POA: Insufficient documentation

## 2024-02-08 DIAGNOSIS — Z808 Family history of malignant neoplasm of other organs or systems: Secondary | ICD-10-CM | POA: Insufficient documentation

## 2024-02-08 LAB — CBC WITH DIFFERENTIAL (CANCER CENTER ONLY)
Abs Immature Granulocytes: 0 K/uL (ref 0.00–0.07)
Basophils Absolute: 0 K/uL (ref 0.0–0.1)
Basophils Relative: 1 %
Eosinophils Absolute: 0.1 K/uL (ref 0.0–0.5)
Eosinophils Relative: 2 %
HCT: 49.4 % (ref 39.0–52.0)
Hemoglobin: 17.1 g/dL — ABNORMAL HIGH (ref 13.0–17.0)
Immature Granulocytes: 0 %
Lymphocytes Relative: 34 %
Lymphs Abs: 1.4 K/uL (ref 0.7–4.0)
MCH: 32.7 pg (ref 26.0–34.0)
MCHC: 34.6 g/dL (ref 30.0–36.0)
MCV: 94.5 fL (ref 80.0–100.0)
Monocytes Absolute: 0.5 K/uL (ref 0.1–1.0)
Monocytes Relative: 12 %
Neutro Abs: 2.1 K/uL (ref 1.7–7.7)
Neutrophils Relative %: 51 %
Platelet Count: 166 K/uL (ref 150–400)
RBC: 5.23 MIL/uL (ref 4.22–5.81)
RDW: 13.1 % (ref 11.5–15.5)
WBC Count: 4.1 K/uL (ref 4.0–10.5)
nRBC: 0 % (ref 0.0–0.2)

## 2024-02-08 LAB — CMP (CANCER CENTER ONLY)
ALT: 16 U/L (ref 0–44)
AST: 24 U/L (ref 15–41)
Albumin: 4.6 g/dL (ref 3.5–5.0)
Alkaline Phosphatase: 71 U/L (ref 38–126)
Anion gap: 10 (ref 5–15)
BUN: 17 mg/dL (ref 8–23)
CO2: 27 mmol/L (ref 22–32)
Calcium: 9.5 mg/dL (ref 8.9–10.3)
Chloride: 106 mmol/L (ref 98–111)
Creatinine: 1.19 mg/dL (ref 0.61–1.24)
GFR, Estimated: 60 mL/min — ABNORMAL LOW
Glucose, Bld: 104 mg/dL — ABNORMAL HIGH (ref 70–99)
Potassium: 4 mmol/L (ref 3.5–5.1)
Sodium: 143 mmol/L (ref 135–145)
Total Bilirubin: 0.8 mg/dL (ref 0.0–1.2)
Total Protein: 7.3 g/dL (ref 6.5–8.1)

## 2024-02-08 NOTE — Progress Notes (Signed)
 " [facility] Telephone:([phone]) [phone]   Fax:([phone]) [phone]  PROGRESS NOTE  Patient Care Team: Caleen Dirks, MD as PCP - General (Internal Medicine) Harvey Seltzer, MD (Family Medicine)  Hematological/Oncological History # Recurrent Lower Extremity DVT August 2006: originally diagnosed with a DVT from the level of the mid thigh (left superficial femoral vein, a part of the deep venous system) to the level of the trifurcation. Completed short course of anticoagulation July 2015 : he presented with pain and swelling of the right leg. Doppler US  revealed  acute DVT involving the right lower extremity and chronic DVT involving the popliteal vein of the left lower extremity. Started on indefinite Xarelto  01/11/2021: underwent doppler ultrasound of the right lower extremity.  Findings revealed nonocclusive thrombus in the right popliteal vein and complete thrombus in the posterior tibial vein 01/23/2021: Establish care with [name].  Transition to Eliquis  therapy.  Interval History:  Ryan [name] 83 y.o. male with medical history significant for recurrent lower extremity DVT presents for a follow up visit. The patient's last visit was on 08/05/2023. In the interim since the last visit he has continued on Eliquis  therapy without difficulty.  On exam today Ryan [name] reports he is doing well without any new or concerning symptoms.  He is tolerating Eliquis  therapy and denies any bleeding episodes including hematochezia or melena.  He denies any signs or symptoms of recurrent VTE such as leg pain, leg swelling, chest pain or shortness of breath.  He has no other complaints.  A full 10 point ROS is otherwise negative.  MEDICAL HISTORY:  Past Medical History:  Diagnosis Date   Arthritis    BPH (benign prostatic hyperplasia)    DVT (deep venous thrombosis) (HCC)    x2 Xarelto  use   Hyperlipidemia     SURGICAL HISTORY: Past Surgical History:  Procedure Laterality Date    APPENDECTOMY     CATARACT EXTRACTION Right    COLONOSCOPY W/ POLYPECTOMY     multiple   COLONOSCOPY WITH PROPOFOL  N/A 10/30/2014   Procedure: COLONOSCOPY WITH PROPOFOL ;  Surgeon: Gladis MARLA Louder, MD;  Location: [facility];  Service: Endoscopy;  Laterality: N/A;   EYE SURGERY     retinal repair bilateral   HERNIA REPAIR     BIH repair child   KNEE ARTHROSCOPY Left    SEPTOPLASTY     TONSILLECTOMY      SOCIAL HISTORY: Social History   Socioeconomic History   Marital status: Married    Spouse name: Arland   Number of children: 6   Years of education: 16   Highest education level: Not on file  Occupational History   Occupation: retired-CFO  Tobacco Use   Smoking status: Former    Current packs/day: 0.00    Types: Cigarettes    Quit date: 02/10/1980    Years since quitting: 44.0   Smokeless tobacco: Never  Substance and Sexual Activity   Alcohol use: Yes    Alcohol/week: 14.0 standard drinks of alcohol    Types: 14 Standard drinks or equivalent per week    Comment:  daily   Drug use: No   Sexual activity: Not on file  Other Topics Concern   Not on file  Social History Narrative   Health Care POA:    Emergency Contact: wife, Arland [phone]   End of Life Plan: Pt reports having a end of life plan and will bring copy for next visit   Who lives with you: wife, Arland  Any pets: none   Diet: Pt has a varied diet of protein, starch, vegetables   Exercise: Pt reports playing tennis and completing yard work at least 5 days per week   Seatbelts: Pt reports wearing seatbelt when in vehicles.    Sun Exposure/Protection: Pt reports wearing ball cap but no sun block   Hobbies: tennis, biking, volunteering, yard work    Social Drivers of Health   Tobacco Use: Medium Risk (08/17/2022)   Patient History    Smoking Tobacco Use: Former    Smokeless Tobacco Use: Never    Passive Exposure: Not on Actuary Strain: Not on file  Food Insecurity: Not on file   Transportation Needs: Not on file  Physical Activity: Not on file  Stress: Not on file  Social Connections: Not on file  Intimate Partner Violence: Not on file  Depression (EYV7-0): Not on file  Alcohol Screen: Not on file  Housing: Not on file  Utilities: Not on file  Health Literacy: Not on file    FAMILY HISTORY: Family History  Problem Relation Age of Onset   Alzheimer's disease Mother    Arthritis Mother    Arthritis Father    Cancer Sister        breast   Cancer Brother        melanoma   Stroke Paternal Grandfather     ALLERGIES:  is allergic to simvastatin and warfarin sodium.  MEDICATIONS:  Current Outpatient Medications  Medication Sig Dispense Refill   acetaminophen  (TYLENOL ) 325 MG tablet Take 650 mg by mouth every 6 (six) hours as needed for mild pain.     donepezil  (ARICEPT ) 10 MG tablet TAKE 1 TABLET BY MOUTH ONCE DAILY 90 tablet 2   ELIQUIS  5 MG TABS tablet TAKE 1 TABLET BY MOUTH TWICE DAILY 180 tablet 2   lovastatin  (MEVACOR ) 20 MG tablet TAKE 1 TABLET BY MOUTH EVERY NIGHT AT BEDTIME 90 tablet 2   memantine  (NAMENDA ) 5 MG tablet TAKE 1 TABLET BY MOUTH TWICE DAILY 180 tablet 2   pantoprazole (PROTONIX) 40 MG tablet TAKE 1 TABLET BY MOUTH ONCE DAILY *DO NOT CRUSH OR CHEW* 90 tablet 2   tamsulosin  (FLOMAX ) 0.4 MG CAPS capsule TAKE 1 CAPSULE BY MOUTH ONCE DAILY *DO NOT CRUSH OR CHEW* **DO NOT OPEN CAPSULE** 90 capsule 2   traZODone  (DESYREL ) 50 MG tablet TAKE 1 TABLET AT BEDTIME AS NEEDED FOR SLEEP. MAY REPEAT ONCE IF STILL AWAKE. 100 tablet 3   No current facility-administered medications for this visit.    REVIEW OF SYSTEMS:   Constitutional: ( - ) fevers, ( - )  chills , ( - ) night sweats Eyes: ( - ) blurriness of vision, ( - ) double vision, ( - ) watery eyes Ears, nose, mouth, throat, and face: ( - ) mucositis, ( - ) sore throat Respiratory: ( - ) cough, ( - ) dyspnea, ( - ) wheezes Cardiovascular: ( - ) palpitation, ( - ) chest discomfort, ( - )  lower extremity swelling Gastrointestinal:  ( - ) nausea, ( - ) heartburn, ( - ) change in bowel habits Skin: ( - ) abnormal skin rashes Lymphatics: ( - ) new lymphadenopathy, ( - ) easy bruising Neurological: ( - ) numbness, ( - ) tingling, ( - ) new weaknesses Behavioral/Psych: ( - ) mood change, ( - ) new changes  All other systems were reviewed with the patient and are negative.  PHYSICAL EXAMINATION:  Vitals:   02/08/24  1145  BP: 138/74  Pulse: (!) 45  Resp: 18  Temp: 97.8 F (36.6 C)  SpO2: 100%     Filed Weights   02/08/24 1145  Weight: 196 lb 14.4 oz (89.3 kg)    GENERAL: Well-appearing elderly Caucasian male, alert, no distress and comfortable SKIN: skin color, texture, turgor are normal, no rashes or significant lesions EYES: conjunctiva are pink and non-injected, sclera clear LUNGS: clear to auscultation and percussion with normal breathing effort HEART: regular rate & rhythm and no murmurs and no lower extremity edema Musculoskeletal: no cyanosis of digits and no clubbing  PSYCH: alert & oriented x 3, fluent speech NEURO: no focal motor/sensory deficits  LABORATORY DATA:  I have reviewed the data as listed    Latest Ref Rng & Units 02/08/2024   11:00 AM 08/05/2023   10:00 AM 02/04/2023    8:33 AM  CBC  WBC 4.0 - 10.5 K/uL 4.1  4.4  4.4   Hemoglobin 13.0 - 17.0 g/dL 82.8  83.4  83.2   Hematocrit 39.0 - 52.0 % 49.4  47.9  47.9   Platelets 150 - 400 K/uL 166  172  168        Latest Ref Rng & Units 02/08/2024   11:00 AM 08/05/2023   10:00 AM 02/04/2023    8:33 AM  CMP  Glucose 70 - 99 mg/dL 895  896  898   BUN 8 - 23 mg/dL 17  20  19    Creatinine 0.61 - 1.24 mg/dL 8.80  8.72  8.73   Sodium 135 - 145 mmol/L 143  141  140   Potassium 3.5 - 5.1 mmol/L 4.0  4.1  4.2   Chloride 98 - 111 mmol/L 106  106  106   CO2 22 - 32 mmol/L 27  28  28    Calcium  8.9 - 10.3 mg/dL 9.5  9.6  9.6   Total Protein 6.5 - 8.1 g/dL 7.3  7.3  7.0   Total Bilirubin 0.0 - 1.2  mg/dL 0.8  1.2  0.9   Alkaline Phos 38 - 126 U/L 71  57  60   AST 15 - 41 U/L 24  21  21    ALT 0 - 44 U/L 16  15  18      RADIOGRAPHIC STUDIES: No results found.  ASSESSMENT & PLAN Ryan [name] is a 83 y.o.  male with medical history significant for recurrent lower extremity DVT presents for a follow up visit.  Due to recurrent DVTs that were clearly unprovoked, the recommendation is indefinite anticoagulation. Since patient failed Xarelto  with recent DVT of the right lower extremity, we recommended to switch to Eliquis .    Patient underwent a hypercoagulable workup to rule out any clotting disorders or genetic mutations that predispose him to venous thromboembolisms.  The results of these tests showed no evidence of a hypercoagulable disorder.   #Recurrent DVTs, bilateral --Felt to be unprovoked without any definitive risk factors so recommend indefinite anticoagulation.  --Patient failed Xarelto  with recent DVT in December 2022.  --Patient has allergy to Warfarin with reported rash.  --Labs today to check CBC, CMP -- hypercoagulation workup with lupus anticoagulant, beta-2 glycoprotein antibodies, cardiolipin antibodies, protein C level and activity, protein S level and activity showed no abnormalities.  PLAN:  --Labs from today were reviewed and adequate for treatment. WBC 4.1, Hgb 17.1, Plt 166, creatinine and LFTS are normal. -- previously discussed maintenance dosing but patient and his daughter would like to continue on  full-strength Eliquis .  --Recommend continuation of Eliquis  5 mg twice daily. --RTC in 6 months with labs.    No orders of the defined types were placed in this encounter.   All questions were answered. The patient knows to call the clinic with any problems, questions or concerns.   I have spent a total of 25 minutes minutes of face-to-face and non-face-to-face time, preparing to see the patient, performing a medically appropriate examination, counseling and  educating the patient, documenting clinical information in the electronic health record, independently interpreting results and communicating results to the patient, and care coordination.   [name] PA-C Dept of Hematology and Oncology [facility] Phone: [phone]   02/08/2024 1:55 PM "

## 2024-02-09 ENCOUNTER — Ambulatory Visit (HOSPITAL_COMMUNITY)
Admission: RE | Admit: 2024-02-09 | Discharge: 2024-02-09 | Disposition: A | Discharge status: Home / Self Care | Source: Ambulatory Visit | Attending: Student in an Organized Health Care Education/Training Program | Admitting: Student in an Organized Health Care Education/Training Program

## 2024-02-09 DIAGNOSIS — I712 Thoracic aortic aneurysm, without rupture, unspecified: Secondary | ICD-10-CM | POA: Insufficient documentation

## 2024-02-24 ENCOUNTER — Ambulatory Visit

## 2024-02-24 VITALS — BP 133/67 | HR 44 | Resp 18 | Ht 76.0 in | Wt 195.0 lb

## 2024-02-24 DIAGNOSIS — I712 Thoracic aortic aneurysm, without rupture, unspecified: Secondary | ICD-10-CM | POA: Diagnosis not present

## 2024-02-24 NOTE — Progress Notes (Signed)
 "      [address] [place] [address]             [phone]            COMPTON BRIGANCE [id number] March 02, 1940   History of Present Illness:  Ryan [name] is an 84 year old man with medical history of recurrent DVT on chronic anticoagulation, abdominal aortic aneurysm, venous insufficiency, restless leg syndrome, BPH, dementia, and hyperlipidemia who presents for continued surveillance of ascending thoracic aortic aneurysm. This was found in 2017 and has measured 4.5 cm for the past eight years. On recent CT scan of chest without contrast aneurysm measured 4.9 cm which is a slight increase in size.  Echocardiogram in 2019 showed that the aortic valve was normal in structure. There was mild aortic stenosis and no aortic regurgitation.   He presents to the clinic and reports that he has been doing well.  His blood pressure is well controlled without the use of medications.  He is very active with walking and cycling.  He denies chest pain, shortness of breath, lower leg edema, and bleeding.   Medications Ordered Prior to Encounter[1]   ROS: Review of Systems  Constitutional:  Negative for fever and malaise/fatigue.  Respiratory:  Negative for cough, shortness of breath and wheezing.   Cardiovascular:  Negative for chest pain, palpitations and leg swelling.  Neurological:  Negative for headaches.     BP 133/67   Pulse (!) 44   Resp 18   Ht 6' 4 (1.93 m)   Wt 195 lb (88.5 kg)   SpO2 94%   BMI 23.74 kg/m   Physical Exam Constitutional:      Appearance: Normal appearance.  HENT:     Head: Normocephalic and atraumatic.  Cardiovascular:     Rate and Rhythm: Normal rate and regular rhythm.     Heart sounds: Normal heart sounds, S1 normal and S2 normal.  Pulmonary:     Effort: Pulmonary effort is normal.     Breath sounds: Normal breath sounds.  Musculoskeletal:     Right lower leg: No edema.     Left lower leg: No edema.  Skin:    General:  Skin is warm and dry.  Neurological:     General: No focal deficit present.     Mental Status: He is alert and oriented to person, place, and time.      Imaging: EXAM: CT CHEST WITHOUT CONTRAST 02/09/2024 01:43:43 PM   TECHNIQUE: CT of the chest was performed without the administration of intravenous contrast. Multiplanar reformatted images are provided for review. Automated exposure control, iterative reconstruction, and/or weight based adjustment of the mA/kV was utilized to reduce the radiation dose to as low as reasonably achievable.   COMPARISON: None available.   CLINICAL HISTORY: Aortic aneurysm suspected.   FINDINGS:   MEDIASTINUM: Slightly increased in size aneurysmal ascending thoracic aorta measuring up to 4.9 cm (from 4.5 cm). Moderate atherosclerotic plaque is noted. Aortic valve leaflet calcification. 4-vessel coronary artery calcification. Mitral annular calcification. Pericardium is unremarkable. The central airways are clear. Small hiatal hernia.   LYMPH NODES: No gross hilar adenopathy with limited evaluation on this noncontrast study. No mediastinal or axillary lymphadenopathy.   LUNGS AND PLEURA: Biapical pleural/pulmonary scarring. No focal consolidation or pulmonary edema. No pleural effusion or pneumothorax.   SOFT TISSUES/BONES: No acute abnormality of the bones or soft tissues.   UPPER ABDOMEN: Limited images of the upper  abdomen demonstrate colonic diverticulosis. No other acute abnormality.   IMPRESSION: 1. Slightly increased in size aneurysmal ascending thoracic aorta measuring up to 4.9 cm (previously 4.5 cm). 2. Atherosclerotic plaque including 4-vessel coronary artery, mitral annular, aortic valve leaflet calcification - correlate for aortic stenosis.   Electronically signed by: Morgane Naveau MD 02/09/2024 02:25 PM EST RP Workstation: [id number]     A/P: Thoracic aortic aneurysm without rupture, unspecified part -4.9 cm  ascending thoracic aortic aneurysm on CT scan of chest without contrast.  Previous echocardiogram showed that the aortic valve is normal in structure. -We discussed the natural history and and risk factors for growth of ascending aortic aneurysms. Discussed recommendations to minimize the risk of further expansion or dissection including careful blood pressure control, avoidance of contact sports and heavy lifting, attention to lipid management.  We covered the importance of continued smoking cessation.  The patient does not yet meet surgical criteria of >5.5cm. The patient is aware of signs and symptoms of aortic dissection and when to present to the emergency department   -Follow up in one year with CTA of chest for continued surveillance    Risk Modification:  Statin:  lovastatin   Smoking cessation instruction/counseling given:  commended patient for quitting and reviewed strategies for preventing relapses  Patient was counseled on importance of Blood Pressure Control  They are instructed to contact their Primary Care Physician if they start to have blood pressure readings over 130s/90s. Do not ever stop blood pressure medications on your own, unless instructed by healthcare professional.  Please avoid use of Fluoroquinolones as this can potentially increase your risk of Aortic Rupture and/or Dissection  Patient educated on signs and symptoms of Aortic Dissection, handout also provided in AVS  Manuelita CHRISTELLA Rough, PA-C 02/24/24     [1]  Current Outpatient Medications on File Prior to Visit  Medication Sig Dispense Refill   acetaminophen  (TYLENOL ) 325 MG tablet Take 650 mg by mouth every 6 (six) hours as needed for mild pain.     donepezil  (ARICEPT ) 10 MG tablet TAKE 1 TABLET BY MOUTH ONCE DAILY 90 tablet 2   ELIQUIS  5 MG TABS tablet TAKE 1 TABLET BY MOUTH TWICE DAILY 180 tablet 2   lovastatin  (MEVACOR ) 20 MG tablet TAKE 1 TABLET BY MOUTH EVERY NIGHT AT BEDTIME 90 tablet 2   memantine   (NAMENDA ) 5 MG tablet TAKE 1 TABLET BY MOUTH TWICE DAILY 180 tablet 2   pantoprazole (PROTONIX) 40 MG tablet TAKE 1 TABLET BY MOUTH ONCE DAILY *DO NOT CRUSH OR CHEW* 90 tablet 2   tamsulosin  (FLOMAX ) 0.4 MG CAPS capsule TAKE 1 CAPSULE BY MOUTH ONCE DAILY *DO NOT CRUSH OR CHEW* **DO NOT OPEN CAPSULE** 90 capsule 2   traZODone  (DESYREL ) 50 MG tablet TAKE 1 TABLET AT BEDTIME AS NEEDED FOR SLEEP. MAY REPEAT ONCE IF STILL AWAKE. 100 tablet 3   No current facility-administered medications on file prior to visit.   "

## 2024-02-24 NOTE — Patient Instructions (Signed)

## 2024-08-07 ENCOUNTER — Inpatient Hospital Stay: Admitting: Hematology and Oncology

## 2024-08-07 ENCOUNTER — Inpatient Hospital Stay
